# Patient Record
Sex: Female | Born: 1960
Health system: Southern US, Community
[De-identification: ages and names within clinical notes are randomized; demographics above are authoritative.]

## PROBLEM LIST (undated history)

## (undated) DIAGNOSIS — L600 Ingrowing nail: Secondary | ICD-10-CM

## (undated) DIAGNOSIS — Z923 Personal history of irradiation: Secondary | ICD-10-CM

## (undated) DIAGNOSIS — F32A Depression, unspecified: Secondary | ICD-10-CM

## (undated) DIAGNOSIS — R232 Flushing: Secondary | ICD-10-CM

## (undated) DIAGNOSIS — K219 Gastro-esophageal reflux disease without esophagitis: Secondary | ICD-10-CM

## (undated) DIAGNOSIS — N200 Calculus of kidney: Secondary | ICD-10-CM

## (undated) DIAGNOSIS — F329 Major depressive disorder, single episode, unspecified: Secondary | ICD-10-CM

## (undated) DIAGNOSIS — D126 Benign neoplasm of colon, unspecified: Secondary | ICD-10-CM

## (undated) DIAGNOSIS — K1231 Oral mucositis (ulcerative) due to antineoplastic therapy: Secondary | ICD-10-CM

## (undated) DIAGNOSIS — G478 Other sleep disorders: Secondary | ICD-10-CM

## (undated) DIAGNOSIS — T451X5A Adverse effect of antineoplastic and immunosuppressive drugs, initial encounter: Secondary | ICD-10-CM

## (undated) DIAGNOSIS — D701 Agranulocytosis secondary to cancer chemotherapy: Secondary | ICD-10-CM

## (undated) DIAGNOSIS — C50919 Malignant neoplasm of unspecified site of unspecified female breast: Secondary | ICD-10-CM

## (undated) DIAGNOSIS — J029 Acute pharyngitis, unspecified: Secondary | ICD-10-CM

## (undated) DIAGNOSIS — M1990 Unspecified osteoarthritis, unspecified site: Secondary | ICD-10-CM

## (undated) DIAGNOSIS — Z86010 Personal history of colonic polyps: Secondary | ICD-10-CM

## (undated) DIAGNOSIS — Z87891 Personal history of nicotine dependence: Secondary | ICD-10-CM

## (undated) DIAGNOSIS — R635 Abnormal weight gain: Secondary | ICD-10-CM

## (undated) DIAGNOSIS — F419 Anxiety disorder, unspecified: Secondary | ICD-10-CM

## (undated) DIAGNOSIS — M25539 Pain in unspecified wrist: Secondary | ICD-10-CM

## (undated) DIAGNOSIS — M25532 Pain in left wrist: Secondary | ICD-10-CM

## (undated) DIAGNOSIS — N6320 Unspecified lump in the left breast, unspecified quadrant: Secondary | ICD-10-CM

## (undated) DIAGNOSIS — T7840XA Allergy, unspecified, initial encounter: Secondary | ICD-10-CM

## (undated) DIAGNOSIS — R21 Rash and other nonspecific skin eruption: Secondary | ICD-10-CM

## (undated) DIAGNOSIS — Z9221 Personal history of antineoplastic chemotherapy: Secondary | ICD-10-CM

## (undated) DIAGNOSIS — J382 Nodules of vocal cords: Secondary | ICD-10-CM

## (undated) HISTORY — DX: Gastro-esophageal reflux disease without esophagitis: K21.9

## (undated) HISTORY — DX: Anxiety disorder, unspecified: F41.9

## (undated) HISTORY — DX: Nodules of vocal cords: J38.2

## (undated) HISTORY — DX: Pain in unspecified wrist: M25.539

## (undated) HISTORY — DX: Other sleep disorders: G47.8

## (undated) HISTORY — DX: Depression, unspecified: F32.A

## (undated) HISTORY — DX: Abnormal weight gain: R63.5

## (undated) HISTORY — DX: Flushing: R23.2

## (undated) HISTORY — DX: Major depressive disorder, single episode, unspecified: F32.9

## (undated) HISTORY — DX: Rash and other nonspecific skin eruption: R21

## (undated) HISTORY — PX: COLONOSCOPY: SHX174

## (undated) HISTORY — DX: Acute pharyngitis, unspecified: J02.9

## (undated) HISTORY — PX: OTHER SURGICAL HISTORY: SHX169

## (undated) HISTORY — DX: Personal history of nicotine dependence: Z87.891

## (undated) HISTORY — DX: Agranulocytosis secondary to cancer chemotherapy: D70.1

## (undated) HISTORY — DX: Adverse effect of antineoplastic and immunosuppressive drugs, initial encounter: T45.1X5A

## (undated) HISTORY — DX: Pain in left wrist: M25.532

## (undated) HISTORY — DX: Unspecified osteoarthritis, unspecified site: M19.90

## (undated) HISTORY — DX: Allergy, unspecified, initial encounter: T78.40XA

## (undated) HISTORY — DX: Calculus of kidney: N20.0

## (undated) HISTORY — DX: Benign neoplasm of colon, unspecified: D12.6

## (undated) HISTORY — DX: Unspecified lump in the left breast, unspecified quadrant: N63.20

## (undated) HISTORY — DX: Oral mucositis (ulcerative) due to antineoplastic therapy: K12.31

---
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
## 1993-02-05 HISTORY — PX: OTHER SURGICAL HISTORY: SHX169

## 2008-08-11 ENCOUNTER — Encounter (INDEPENDENT_AMBULATORY_CARE_PROVIDER_SITE_OTHER): Payer: Self-pay | Admitting: *Deleted

## 2008-09-27 ENCOUNTER — Encounter: Payer: Self-pay | Admitting: Family Medicine

## 2008-09-27 ENCOUNTER — Ambulatory Visit: Payer: Self-pay | Admitting: Family Medicine

## 2008-09-27 DIAGNOSIS — Z87891 Personal history of nicotine dependence: Secondary | ICD-10-CM | POA: Insufficient documentation

## 2008-09-27 DIAGNOSIS — N951 Menopausal and female climacteric states: Secondary | ICD-10-CM | POA: Insufficient documentation

## 2008-09-27 HISTORY — DX: Personal history of nicotine dependence: Z87.891

## 2008-09-27 HISTORY — DX: Menopausal and female climacteric states: N95.1

## 2008-09-27 LAB — CONVERTED CEMR LAB
ALT: 14 units/L (ref 0–35)
Alkaline Phosphatase: 62 units/L (ref 39–117)
Calcium: 9.2 mg/dL (ref 8.4–10.5)
Chloride: 103 meq/L (ref 96–112)
Creatinine, Ser: 0.69 mg/dL (ref 0.40–1.20)
Glucose, Bld: 88 mg/dL (ref 70–99)
HDL: 59 mg/dL (ref >39)
Potassium: 4 meq/L (ref 3.5–5.3)
Sodium: 140 meq/L (ref 135–145)
Total Bilirubin: 0.5 mg/dL (ref 0.3–1.2)
Total CHOL/HDL Ratio: 3.7
Triglycerides: 134 mg/dL (ref <150)
VLDL: 27 mg/dL (ref 0–40)

## 2008-09-29 ENCOUNTER — Encounter: Payer: Self-pay | Admitting: Family Medicine

## 2008-09-30 ENCOUNTER — Encounter: Payer: Self-pay | Admitting: Family Medicine

## 2009-08-26 ENCOUNTER — Ambulatory Visit: Payer: Self-pay | Admitting: Family Medicine

## 2009-08-26 ENCOUNTER — Encounter: Payer: Self-pay | Admitting: Family Medicine

## 2009-08-26 DIAGNOSIS — E785 Hyperlipidemia, unspecified: Secondary | ICD-10-CM | POA: Insufficient documentation

## 2009-08-26 LAB — CONVERTED CEMR LAB
Cholesterol: 217 mg/dL — ABNORMAL HIGH (ref 0–200)
HDL: 59 mg/dL (ref >39)
TSH: 1.436 microintl units/mL (ref 0.350–4.500)
Total CHOL/HDL Ratio: 3.7
Triglycerides: 113 mg/dL (ref <150)

## 2009-08-29 ENCOUNTER — Encounter: Payer: Self-pay | Admitting: Family Medicine

## 2009-09-12 ENCOUNTER — Encounter: Admission: RE | Admit: 2009-09-12 | Discharge: 2009-09-12 | Payer: Self-pay | Admitting: Family Medicine

## 2009-10-18 ENCOUNTER — Telehealth: Payer: Self-pay | Admitting: Family Medicine

## 2009-11-15 ENCOUNTER — Telehealth: Payer: Self-pay | Admitting: Family Medicine

## 2010-02-13 ENCOUNTER — Encounter (INDEPENDENT_AMBULATORY_CARE_PROVIDER_SITE_OTHER): Payer: Self-pay | Admitting: *Deleted

## 2010-03-03 ENCOUNTER — Encounter (INDEPENDENT_AMBULATORY_CARE_PROVIDER_SITE_OTHER): Payer: Self-pay | Admitting: *Deleted

## 2010-03-06 ENCOUNTER — Ambulatory Visit
Admission: RE | Admit: 2010-03-06 | Discharge: 2010-03-06 | Payer: Self-pay | Source: Home / Self Care | Attending: Gastroenterology | Admitting: Gastroenterology

## 2010-03-07 NOTE — Progress Notes (Signed)
Summary: activella rx  Phone Note Call from Patient   Call For: [phone] Summary of Call: Picked up rx for Activella, but the dosage was less than original.  Patient would like for rx to be changed back to original strength.  Need meds today.    [facility].  No longer use [facility]. Initial call taken by: Britta Mccreedy mcgregor  Follow-up for Phone Call        will forward to MD. Follow-up by: Theresia Lo RN,  October 18, 2009 12:33 PM  Additional Follow-up for Phone Call Additional follow up Details #1::        Called her pharmacy.  She has indeed been on higher dose and did not pick up the lower dose in September.  Will call in the higher dose for her for one month, and can discuss lowering the dose with Dr. Lelon Perla. Additional Follow-up by: Alizandra Loh [place] MD,  October 18, 2009 3:21 PM    New/Updated Medications: ACTIVELLA 1-0.5 MG TABS (ESTRADIOL-NORETHINDRONE ACET) 1 by mouth daily Prescriptions: ACTIVELLA 1-0.5 MG TABS (ESTRADIOL-NORETHINDRONE ACET) 1 by mouth daily  #1 pack x 0   Entered and Authorized by:   Dajia Gunnels [place] MD   Signed by:   Leler Brion [place] MD on 10/18/2009   Method used:   [facility]        [facility]  [phone]* (retail)       [address] [place], [place]  [address]       Ph: [phone] or [phone]       Fax: [phone]   RxID:   [id number]

## 2010-03-07 NOTE — Assessment & Plan Note (Signed)
Summary: cpe,df   Vital Signs:  Patient profile:   50 year old female Height:      62 inches Weight:      140.8 pounds BMI:     25.85 Temp:     98.6 degrees F oral Pulse rate:   80 / minute BP sitting:   100 / 44  (left arm) Cuff size:   regular  Vitals Entered By: Garen Grams LPN (August 26, 2009 8:41 AM) CC: cpe Is Patient Diabetic? No Pain Assessment Patient in pain? no        CC:  cpe.  History of Present Illness: Physical Exam:  Concerns: 1. Hot flashes - Taking HRT (Estrogen + Progestin).  It is helping but not as good as the Prempro.  She is still having 3 hot flashes at night.  Not interested in adjusting dose at this time.  Prevention: 1. PAP smears:  She has had 8 normal PAP smears in the past 8 years.   2. Mammogram:  Has not had a mammogram in 6 years 3. Colonoscopy:  Never had a colonscopy 4. Tobacco: continues to smoke 1 ppd.  Not interested in quiting at this time  ROS: denies chest pain, shortness of breath, fevers, chills, weight loss, abdominal pain, lumps / bumps  Habits & Providers  Alcohol-Tobacco-Diet     Tobacco Status: current     Tobacco Counseling: to quit use of tobacco products     Cigarette Packs/Day: 1.0  Current Medications (verified): 1)  Activella 0.5-0.1 Mg Tabs (Estradiol-Norethindrone Acet) .Marland Kitchen.. 1 Tab By Mouth Daily For Menopausal Symptoms  Allergies: 1)  ! Codeine  Past History:  Past Medical History: Reviewed history from 09/27/2008 and no changes required. Menopausal Syndrome--Hot flashes.  Family History: Reviewed history from 09/27/2008 and no changes required. Mother alive.  HTN. Father deceased age 59 from CVA.  No other health problems Brother and sister are alive and healthy. 2 daughters alive and healthy.  Social History: Reviewed history from 09/27/2008 and no changes required. Lives in [place] with her husband and mother.  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  She drinks  alcohol occassionally and smokes 1 pack of cigarettes per day, uses no illicit drugs.  Physical Exam  General:  Vitals reviewed.  Awake.  Alert.  No acute distress. Eyes:  vision grossly intact.  PERRL.  Fundoscopic exam benign Nose:  no external deformity and no nasal discharge.   Mouth:  good dentition and pharynx pink and moist.   Neck:  supple, full ROM, and no masses.   Lungs:  normal respiratory effort, normal breath sounds, no crackles, and no wheezes.   Heart:  normal rate, regular rhythm, and no murmur.   Abdomen:  soft, non-tender, normal bowel sounds, and no distention.   Extremities:  no lower extremity edema Neurologic:  alert & oriented X3, cranial nerves II-XII intact, strength normal in all extremities, and sensation intact to light touch.   Skin:  turgor normal and color normal.   Psych:  normally interactive, not anxious appearing, and not depressed appearing.     Impression & Recommendations:  Problem # 1:  Preventive Health Care (ICD-V70.0) Assessment Unchanged  -Colonoscopy -Mammogram -Pap smear next year -Lipids  Orders: [facility] - Est  40-64 yrs ([address])  Problem # 2:  MENOPAUSAL SYNDROME (ICD-627.2) Assessment: Unchanged Hot flashes manageable.  Would not want to adjust medications.  Will check TSH to see if that is contributing. Her updated medication  list for this problem includes:    Activella 0.5-0.1 Mg Tabs (Estradiol-norethindrone acet) .Marland Kitchen... 1 tab by mouth daily for menopausal symptoms  Orders: TSH-[facility] ([address])  Problem # 3:  TOBACCO ABUSE (ICD-305.1) Assessment: Unchanged Advised to quit smoking.  Precontemplative at this point.  Complete Medication List: 1)  Activella 0.5-0.1 Mg Tabs (Estradiol-norethindrone acet) .Marland Kitchen.. 1 tab by mouth daily for menopausal symptoms  Other Orders: Colonoscopy (Colon) Mammogram (Screening) (Mammo) Lipid-[facility] ([address])  Patient Instructions: 1)  It was nice to meet you today 2)  I look forward to  taking over your care 3)  We will get you set up for the colonoscopy.  Please call and schedule a mammogram. 4)  We will also check your thyroid and cholesterol levels today 5)  We will do a PAP smear next year 6)  Please schedule a follow up appointment in 1 year Prescriptions: ACTIVELLA 0.5-0.1 MG TABS (ESTRADIOL-NORETHINDRONE ACET) 1 tab by mouth daily for menopausal symptoms  #30 x 11   Entered and Authorized by:   Angelena Sole MD   Signed by:   Angelena Sole MD on 08/26/2009   Method used:   Electronically to        [facility] [phone]* (retail)       [address]       [place], [place]  [address]       Ph: [phone] or [phone]       Fax: [phone]   RxID:   [id number]   Prevention & Chronic Care Immunizations   Influenza vaccine: Not documented    Tetanus booster: 09/27/2008: Tdap    Pneumococcal vaccine: Not documented  Colorectal Screening   Hemoccult: Not documented    Colonoscopy: Not documented   Colonoscopy action/deferral: GI Referral  (08/26/2009)  Other Screening   Pap smear: NEGATIVE FOR INTRAEPITHELIAL LESIONS OR MALIGNANCY.  (09/27/2008)   Pap smear action/deferral: Ordered  (09/27/2008)    Mammogram: Not documented   Mammogram action/deferral: Ordered  (08/26/2009)   Smoking status: current  (08/26/2009)   Smoking cessation counseling: yes  (09/27/2008)  Lipids   Total Cholesterol: 218  (09/27/2008)   Lipid panel action/deferral: Lipid Panel ordered   LDL: 132  (09/27/2008)   LDL Direct: Not documented   HDL: 59  (09/27/2008)   Triglycerides: 134  (09/27/2008)    SGOT (AST): 18  (09/27/2008)   SGPT (ALT): 14  (09/27/2008)   Alkaline phosphatase: 62  (09/27/2008)   Total bilirubin: 0.5  (09/27/2008)  Self-Management Support :    Lipid self-management support: Not documented    Nursing Instructions: Screening colonoscopy ordered Schedule screening mammogram (see order)

## 2010-03-07 NOTE — Progress Notes (Signed)
Summary: Rx Req  Phone Note Refill Request Call back at Home Phone [phone] Message from:  Patient  Refills Requested: Medication #1:  ACTIVELLA 1-0.5 MG TABS 1 by mouth daily. [facility] MORE THAN 1 MONTH SUPPLY.  Initial call taken by: Clydell Hakim,  November 15, 2009 9:12 AM    Prescriptions: ACTIVELLA 1-0.5 MG TABS (ESTRADIOL-NORETHINDRONE ACET) 1 by mouth daily  #1 pack x 6   Entered and Authorized by:   Angelena Sole MD   Signed by:   Angelena Sole MD on 11/16/2009   Method used:   [facility]        [facility]  [phone]* (retail)       [address] [place], [place]  [address]       Ph: [phone] or [phone]       Fax: [phone]   RxID:   [id number]

## 2010-03-07 NOTE — Letter (Signed)
Summary: Generic Letter  [facility]  [address]   [place], [place] [address]   Phone: [phone]  Fax: [phone]    08/29/2009  [facility] [address] [place], [place]  [address]  Dear Ms. Liz Malady,  Here is a copy of your lab results.  Overall they looked pretty good.  Your bad cholesterol (LDL) was just a little bit too high (135, goal is < 130).  I think that it would probably be good to at least start taking a fish oil.  Your thyroid level was normal.  Tests: (1) Lipid Profile ([address])   Order Note: FASTING   Cholesterol          [H]  217 mg/dL                   4-259     ATP III Classification:           < 200        mg/dL        Desirable          200 - 239     mg/dL        Borderline High          >= 240        mg/dL        High         Triglyceride              113 mg/dL                   <563   HDL Cholesterol           59 mg/dL                    >87   Total Chol/HDL Ratio      3.7 Ratio  VLDL Cholesterol (Calc)                             23 mg/dL                    5-64  LDL Cholesterol (Calc)                        [H]  135 mg/dL                   3-32           Total Cholesterol/HDL Ratio:CHD Risk                            Coronary Heart Disease Risk Table                                            Men       Women              1/2 Average Risk              3.4        3.3                  Average Risk              5.0  4.4              2 X Average Risk              9.6        7.1              3 X Average Risk             23.4       11.0     Use the calculated Patient Ratio above and the CHD Risk table      to determine the patient's CHD Risk.     ATP III Classification (LDL):           < 100        mg/dL         Optimal          100 - 129     mg/dL         Near or Above Optimal          130 - 159     mg/dL         Borderline High          160 - 189     mg/dL         High           > 190        mg/dL         Very  High         Sincerely,   Angelena Sole MD  Appended Document: Generic Letter mailed

## 2010-03-08 DIAGNOSIS — D126 Benign neoplasm of colon, unspecified: Secondary | ICD-10-CM

## 2010-03-08 DIAGNOSIS — Z860100 Personal history of colon polyps, unspecified: Secondary | ICD-10-CM | POA: Insufficient documentation

## 2010-03-08 DIAGNOSIS — D369 Benign neoplasm, unspecified site: Secondary | ICD-10-CM

## 2010-03-08 HISTORY — DX: Personal history of colonic polyps: Z86.010

## 2010-03-08 HISTORY — DX: Benign neoplasm of colon, unspecified: D12.6

## 2010-03-08 HISTORY — DX: Personal history of colon polyps, unspecified: Z86.0100

## 2010-03-09 NOTE — Letter (Signed)
Summary: Pre Visit Letter Revised  [facility]  [address] [place], [place] [address]   Phone: [phone]  Fax: [phone]        02/13/2010 MRN: [id number] [facility] [address] [place], [place]  [address]             Procedure Date:  03/13/2010 @ 1:30   Direct colon-Dr. Russella Dar   Welcome to the Gastroenterology Division at [facility].    You are scheduled to see a nurse for your pre-procedure visit on 03/06/2010 at 1:00 on the 3rd floor at [facility], [address]  We ask that you try to arrive at our office 15 minutes prior to your appointment time to allow for check-in.  Please take a minute to review the attached form.  If you answer "Yes" to one or more of the questions on the first page, we ask that you call the person listed at your earliest opportunity.  If you answer "No" to all of the questions, please complete the rest of the form and bring it to your appointment.    Your nurse visit will consist of discussing your medical and surgical history, your immediate family medical history, and your medications.   If you are unable to list all of your medications on the form, please bring the medication bottles to your appointment and we will list them.  We will need to be aware of both prescribed and over the counter drugs.  We will need to know exact dosage information as well.    Please be prepared to read and sign documents such as consent forms, a financial agreement, and acknowledgement forms.  If necessary, and with your consent, a friend or relative is welcome to sit-in on the nurse visit with you.  Please bring your insurance card so that we may make a copy of it.  If your insurance requires a referral to see a specialist, please bring your referral form from your primary care physician.  No co-pay is required for this nurse visit.     If you cannot keep your appointment, please call [phone] to cancel or reschedule prior to your  appointment date.  This allows [place] the opportunity to schedule an appointment for another patient in need of care.    Thank you for choosing [facility] for your medical needs.  We appreciate the opportunity to care for you.  Please visit [place] at our website  to learn more about our practice.  Sincerely, The Gastroenterology Division

## 2010-03-13 ENCOUNTER — Other Ambulatory Visit (AMBULATORY_SURGERY_CENTER): Payer: BLUE CROSS/BLUE SHIELD | Admitting: Gastroenterology

## 2010-03-13 ENCOUNTER — Other Ambulatory Visit: Payer: Self-pay | Admitting: Gastroenterology

## 2010-03-13 DIAGNOSIS — D126 Benign neoplasm of colon, unspecified: Secondary | ICD-10-CM

## 2010-03-13 DIAGNOSIS — Z1211 Encounter for screening for malignant neoplasm of colon: Secondary | ICD-10-CM

## 2010-03-15 NOTE — Miscellaneous (Signed)
Summary: [facility] PV  Clinical Lists Changes  Medications: Added new medication of MOVIPREP 100 GM  SOLR (PEG-KCL-NACL-NASULF-NA ASC-C) As per prep instructions. - Signed Rx of MOVIPREP 100 GM  SOLR (PEG-KCL-NACL-NASULF-NA ASC-C) As per prep instructions.;  #1 x 0;  Signed;  Entered by: Ezra Sites RN;  Authorized by: Meryl Dare MD Clementeen Graham;  Method used: Electronically to [facility] [id number] [address], [place], [place], [place]  [address], Ph: [phone] or [phone], Fax: [phone] Allergies: Changed allergy or adverse reaction from CODEINE to CODEINE    Prescriptions: MOVIPREP 100 GM  SOLR (PEG-KCL-NACL-NASULF-NA ASC-C) As per prep instructions.  #1 x 0   Entered by:   Ezra Sites RN   Authorized by:   Meryl Dare MD [facility]   Signed by:   Ezra Sites RN on 03/06/2010   Method used:   Electronically to        [facility] [address] [phone]* (retail)       [address]       [place], [place]  [address]       Ph: [phone] or [phone]       Fax: [phone]   RxID:   [id number]

## 2010-03-15 NOTE — Letter (Signed)
Summary: [facility] Instructions  [facility]  [address] [place], [place] [address]   Phone: [phone]  Fax: [phone]       [facility]    [birth year]    MRN: [id number]        Procedure Day /Date: Monday 03-13-10      Arrival Time: 12:30 pm     Procedure Time: 1:30 pm     Location of Procedure:                    _x _  [facility] (4th Floor)   PREPARATION FOR COLONOSCOPY WITH MOVIPREP   Starting 5 days prior to your procedure  03-08-10 do not eat nuts, seeds, popcorn, corn, beans, peas,  salads, or any raw vegetables.  Do not take any fiber supplements (e.g. Metamucil, Citrucel, and Benefiber).  THE DAY BEFORE YOUR PROCEDURE         DATE:  03-12-10   DAY: Sunday   1.  Drink clear liquids the entire day-NO SOLID FOOD  2.  Do not drink anything colored red or purple.  Avoid juices with pulp.  No orange juice.  3.  Drink at least 64 oz. (8 glasses) of fluid/clear liquids during the day to prevent dehydration and help the prep work efficiently.  CLEAR LIQUIDS INCLUDE: Water Jello Ice Popsicles Tea (sugar ok, no milk/cream) Powdered fruit flavored drinks Coffee (sugar ok, no milk/cream) Gatorade Juice: apple, white grape, white cranberry  Lemonade Clear bullion, consomm, broth Carbonated beverages (any kind) Strained chicken noodle soup Hard Candy                             4.  In the morning, mix first dose of MoviPrep solution:    Empty 1 Pouch A and 1 Pouch B into the disposable container    Add lukewarm drinking water to the top line of the container. Mix to dissolve    Refrigerate (mixed solution should be used within 24 hrs)  5.  Begin drinking the prep at 5:00 p.m. The MoviPrep container is divided by 4 marks.   Every 15 minutes drink the solution down to the next mark (approximately 8 oz) until the full liter is complete.   6.  Follow completed prep with 16 oz of clear liquid of your choice (Nothing red or  purple).  Continue to drink clear liquids until bedtime.  7.  Before going to bed, mix second dose of MoviPrep solution:    Empty 1 Pouch A and 1 Pouch B into the disposable container    Add lukewarm drinking water to the top line of the container. Mix to dissolve    Refrigerate  THE DAY OF YOUR PROCEDURE      DATE: 03-13-10  DAY: Monday  Beginning at 8:30 a.m. (5 hours before procedure):         1. Every 15 minutes, drink the solution down to the next mark (approx 8 oz) until the full liter is complete.  2. Follow completed prep with 16 oz. of clear liquid of your choice.    3. You may drink clear liquids until  11:30 a.m. (2 HOURS BEFORE PROCEDURE).   MEDICATION INSTRUCTIONS  Unless otherwise instructed, you should take regular prescription medications with a small sip of water   as early as possible the morning of your procedure.           OTHER INSTRUCTIONS  You will need a responsible adult at least 52 years of age to accompany you and drive you home.   This person must remain in the waiting room during your procedure.  Wear loose fitting clothing that is easily removed.  Leave jewelry and other valuables at home.  However, you may wish to bring a book to read or  an iPod/MP3 player to listen to music as you wait for your procedure to start.  Remove all body piercing jewelry and leave at home.  Total time from sign-in until discharge is approximately 2-3 hours.  You should go home directly after your procedure and rest.  You can resume normal activities the  day after your procedure.  The day of your procedure you should not:   Drive   Make legal decisions   Operate machinery   Drink alcohol   Return to work  You will receive specific instructions about eating, activities and medications before you leave.    The above instructions have been reviewed and explained to me by   Ezra Sites RN  March 06, 2010 1:01 PM     I fully understand and  can verbalize these instructions _____________________________ Date _________

## 2010-03-16 ENCOUNTER — Encounter: Payer: Self-pay | Admitting: Gastroenterology

## 2010-03-23 NOTE — Procedures (Addendum)
Summary: Colonoscopy   Colonoscopy  Procedure date:  03/13/2010  Findings:      Location:  [facility].   COLONOSCOPY PROCEDURE REPORT  PATIENT:  Shirley, Howard  MR#:  [id number] BIRTHDATE:   1960/03/18, 49 yrs. old   GENDER:   female ENDOSCOPIST:   Judie Petit T. Russella Dar, MD, [facility] Referred by: Angelena Sole, M.D. PROCEDURE DATE:  03/13/2010 PROCEDURE:  Colonoscopy with snare polypectomy ASA CLASS:   Class II INDICATIONS: 1) Routine Risk Screening  MEDICATIONS:    Fentanyl 75 mcg IV, Versed 8 mg IV DESCRIPTION OF PROCEDURE:   After the risks benefits and alternatives of the procedure were thoroughly explained, informed consent was obtained.  Digital rectal exam was performed and revealed no abnormalities.   The LB [id number]-[id number] endoscope was introduced through the anus and advanced to the cecum, which was identified by both the appendix and ileocecal valve, without limitations.  The quality of the prep was excellent, using MoviPrep.  The instrument was then slowly withdrawn as the colon was fully examined. <<PROCEDUREIMAGES>>          <<OLD IMAGES>>  FINDINGS:  Two polyps were found in the sigmoid colon. They were 4 mm in size. Polyps were snared without cautery. Retrieval was successful.  A normal appearing cecum, ileocecal valve, and appendiceal orifice were identified. The ascending, hepatic flexure, transverse, splenic flexure, descending colon, and rectum appeared unremarkable. Retroflexed views in the rectum revealed no abnormalities. The time to cecum =  4.5  minutes. The scope was then withdrawn (time =  12.75  min) from the patient and the procedure completed.  COMPLICATIONS:   None  ENDOSCOPIC IMPRESSION:  1) 4 mm, two polyps in the sigmoid colon  RECOMMENDATIONS:  1) Await pathology results  2) If the polyps are adenomatous (pre-cancerous), colonoscopy in 5 years. Otherwise follow colorectal cancer screening guidelines for "routine risk" patients with  colonoscopy in 10 years.   Venita Lick. Russella Dar, MD, [facility]     Appended Document: Colonoscopy     Procedures Next Due Date:    Colonoscopy: 03/2015

## 2010-03-23 NOTE — Letter (Addendum)
Summary: Patient Notice- Polyp Results  [facility]  [address] [place], [place] [address]   Phone: [phone]  Fax: [phone]        March 16, 2010 MRN: [id number]    [facility] [address] [place], [place]  [address]    Dear [name],  I am pleased to inform you that the colon polyp(s) removed during your recent colonoscopy was (were) found to be benign (no cancer detected) upon pathologic examination.  I recommend you have a repeat colonoscopy examination in 5 years to look for recurrent polyps, as having colon polyps increases your risk for having recurrent polyps or even colon cancer in the future.  Should you develop new or worsening symptoms of abdominal pain, bowel habit changes or bleeding from the rectum or bowels, please schedule an evaluation with either your primary care physician or with me.  Continue treatment plan as outlined the day of your exam.  Please call us if you are having persistent problems or have questions about your condition that have not been fully answered at this time.  Sincerely,  Meryl Dare MD [facility]  This letter has been electronically signed by your physician.  Appended Document: Patient Notice- Polyp Results Letter Mailed

## 2010-10-12 ENCOUNTER — Other Ambulatory Visit: Payer: Self-pay | Admitting: Family Medicine

## 2010-10-12 NOTE — Telephone Encounter (Signed)
Refill request

## 2010-10-14 NOTE — Telephone Encounter (Signed)
Patient needs make appointment. Over due for her 1 year follow-up physical.

## 2010-10-16 NOTE — Telephone Encounter (Signed)
Pt will call for an October appt - that is when her insurance will kick in.

## 2010-12-27 ENCOUNTER — Encounter: Payer: Self-pay | Admitting: Family Medicine

## 2010-12-27 ENCOUNTER — Other Ambulatory Visit (HOSPITAL_COMMUNITY)
Admission: RE | Admit: 2010-12-27 | Discharge: 2010-12-27 | Disposition: A | Discharge status: Home / Self Care | Payer: BC Managed Care – PPO | Source: Ambulatory Visit | Attending: Family Medicine | Admitting: Family Medicine

## 2010-12-27 ENCOUNTER — Ambulatory Visit (INDEPENDENT_AMBULATORY_CARE_PROVIDER_SITE_OTHER): Payer: BLUE CROSS/BLUE SHIELD | Admitting: Family Medicine

## 2010-12-27 VITALS — BP 106/65 | HR 58 | Temp 98.1°F | Ht 61.0 in | Wt 140.8 lb

## 2010-12-27 DIAGNOSIS — Z01419 Encounter for gynecological examination (general) (routine) without abnormal findings: Secondary | ICD-10-CM | POA: Insufficient documentation

## 2010-12-27 DIAGNOSIS — F17200 Nicotine dependence, unspecified, uncomplicated: Secondary | ICD-10-CM

## 2010-12-27 DIAGNOSIS — Z124 Encounter for screening for malignant neoplasm of cervix: Secondary | ICD-10-CM

## 2010-12-27 DIAGNOSIS — N951 Menopausal and female climacteric states: Secondary | ICD-10-CM

## 2010-12-27 DIAGNOSIS — E785 Hyperlipidemia, unspecified: Secondary | ICD-10-CM

## 2010-12-27 LAB — LIPID PANEL
Cholesterol: 195 mg/dL (ref 0–200)
HDL: 51 mg/dL (ref >39)
LDL Cholesterol: 128 mg/dL — ABNORMAL HIGH (ref 0–99)
Total CHOL/HDL Ratio: 3.8 Ratio
Triglycerides: 81 mg/dL (ref <150)

## 2010-12-27 NOTE — Assessment & Plan Note (Signed)
Checking lipids today.

## 2010-12-27 NOTE — Patient Instructions (Signed)
It was nice to meet you.  If your lab results are normal, I will send you a letter with the results. If abnormal, someone at the clinic will get in touch with you.   Follow-up in 1 year for your next physical.  Please stop smoking.   Happy Thanksgiving.

## 2010-12-27 NOTE — Progress Notes (Signed)
  Subjective:    Patient ID: Shirley Howard, female    DOB: 10/10/1960, 50 y.o.   MRN: [id number]  HPI Here for Pap smear.  2. Menopausal symptoms Hot flashes. Controlled with estrogen tablets. ROS:    Last period 2 years ago.    Denies vaginal dryness, dyspareunia, nipple discharge, breast pain/tenderness.  3. HLD Would like lipids checked today.   4. Tobacco Still smoking 1 ppd. Would like to quit. Difficulty: husband still smokes. Thinks she would be able to quit if her husband didn't smoke. ROS:    Denies difficulty breathing   Denies chest pain  Review of Systems Per HPI    Objective:   Physical Exam Gen: NAD, well-groomed, appears fit Psych: pleasant, engaged, appropriate CV: RRR, no m/r/g Pulm: CTAB, no w/r/r Abd: NABS, soft, NT/ND Ext: no edema GU:    Wart at 5 o'clock position (patient says it has been there for a while)   Vagina: moist, speculum went in easily. Thin white discharge.   Cervix: appears normal; no bleeding, non-friable    Assessment & Plan:

## 2010-12-27 NOTE — Assessment & Plan Note (Signed)
Still smoking 1 ppd.  Difficult to quit because husband smokes. Ready to quit and knows she should. Was successful for 2 years.   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 [place] Spaniel would consider using nicotine replacement, but would prefer to just quit. She thinks she can.

## 2010-12-27 NOTE — Assessment & Plan Note (Addendum)
Persistent. Past 2 years. Estrogen tablets help significantly; tried to not take 1 day and severe hot flashes. Will continue for now. Will re-assess need in 6 months. No worrisome side effects at this time.

## 2011-01-02 ENCOUNTER — Encounter: Payer: Self-pay | Admitting: Family Medicine

## 2011-02-12 ENCOUNTER — Telehealth: Payer: Self-pay | Admitting: Family Medicine

## 2011-02-12 DIAGNOSIS — N951 Menopausal and female climacteric states: Secondary | ICD-10-CM

## 2011-02-12 MED ORDER — ESTRADIOL-NORETHINDRONE ACET 1-0.5 MG PO TABS: 1.0000 | ORAL_TABLET | Freq: Every day | ORAL | Status: DC

## 2011-02-12 NOTE — Telephone Encounter (Signed)
Patient in for visit 12/2010 . No refill noted at that time.   . Paged Dr. Madolyn Frieze  she advises to refill now. Patient notified.

## 2011-02-12 NOTE — Telephone Encounter (Signed)
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  [facility]

## 2011-05-23 ENCOUNTER — Other Ambulatory Visit: Payer: Self-pay | Admitting: Family Medicine

## 2011-05-23 DIAGNOSIS — N951 Menopausal and female climacteric states: Secondary | ICD-10-CM

## 2011-05-23 MED ORDER — ESTRADIOL-NORETHINDRONE ACET 1-0.5 MG PO TABS: 1.0000 | ORAL_TABLET | Freq: Every day | ORAL | Status: DC

## 2011-09-17 ENCOUNTER — Telehealth: Payer: Self-pay | Admitting: Family Medicine

## 2011-09-17 DIAGNOSIS — N951 Menopausal and female climacteric states: Secondary | ICD-10-CM

## 2011-09-17 MED ORDER — ESTRADIOL-NORETHINDRONE ACET 1-0.5 MG PO TABS: 1.0000 | ORAL_TABLET | Freq: Every day | ORAL | Status: DC

## 2011-09-17 NOTE — Telephone Encounter (Signed)
Number invalid  Received refill Rx for M[place]vey HRT for menopause, however, she has history of tobacco use   Will give Rx for 1 month, advise follow-up within that time to discuss need for medication and tobacco use

## 2011-09-27 ENCOUNTER — Telehealth: Payer: Self-pay | Admitting: Family Medicine

## 2011-09-27 DIAGNOSIS — N951 Menopausal and female climacteric states: Secondary | ICD-10-CM

## 2011-09-27 MED ORDER — ESTRADIOL-NORETHINDRONE ACET 1-0.5 MG PO TABS: 1.0000 | ORAL_TABLET | Freq: Every day | ORAL | Status: DC

## 2011-09-27 NOTE — Telephone Encounter (Signed)
I apologized for mix-up I did write on D/C summary for her to follow-up in 1 year but I wanted to re-assess her hot flashes in 6 months  Rx given for 2 months worth. Advised she make follow-up within that time period

## 2011-09-27 NOTE — Telephone Encounter (Signed)
States that Dr Madolyn Frieze told her not to come for a year which is November (for her annual) and that Dr Madolyn Frieze did not refill enough MIMVEY to last until then.  Wants to know why it is not filled until then

## 2011-10-15 ENCOUNTER — Ambulatory Visit (INDEPENDENT_AMBULATORY_CARE_PROVIDER_SITE_OTHER): Payer: BC Managed Care – PPO | Admitting: Family Medicine

## 2011-10-15 ENCOUNTER — Encounter: Payer: Self-pay | Admitting: Family Medicine

## 2011-10-15 VITALS — BP 126/79 | HR 64 | Temp 98.3°F | Ht 61.0 in | Wt 147.6 lb

## 2011-10-15 DIAGNOSIS — Z8659 Personal history of other mental and behavioral disorders: Secondary | ICD-10-CM | POA: Insufficient documentation

## 2011-10-15 DIAGNOSIS — F17200 Nicotine dependence, unspecified, uncomplicated: Secondary | ICD-10-CM

## 2011-10-15 DIAGNOSIS — N951 Menopausal and female climacteric states: Secondary | ICD-10-CM

## 2011-10-15 MED ORDER — VENLAFAXINE HCL 37.5 MG PO TABS: ORAL_TABLET | ORAL | Status: DC

## 2011-10-15 NOTE — Assessment & Plan Note (Signed)
-  Stop Mimvey due to persistent tobacco use -Try Effexor. Discussed side effects (notably initial anxiety).

## 2011-10-15 NOTE — Assessment & Plan Note (Signed)
Not interested in quitting 

## 2011-10-15 NOTE — Patient Instructions (Addendum)
Start Effexor.    Week 1: 1/2 tablet twice a day   Week 2 on: 1 tablet twice a day  Follow-up in 4 weeks   INTRODUCTION - During a woman's reproductive years, the ovaries produce estrogen and progesterone. Estrogen is important for normal menstrual periods and fertility, and it promotes bone strength. Estrogen and progesterone levels fall at the time of menopause, causing well-known symptoms such as hot flashes. Postmenopausal hormone therapy is the term used to describe the two hormones, estrogen and progestin, that are the most effective treatments available to relieve bothersome symptoms of menopause. However, some women cannot take estrogen, for example, women with breast cancer. Other women choose not to take hormone therapy. Fortunately, there are some alternatives to hormone therapy to treat menopausal symptoms. Although they may not be as effective as estrogen, they do provide some relief. This article discusses alternatives to postmenopausal hormone therapy. A separate article discusses the risks, benefits, and options for hormone therapy. (See "Patient information: Postmenopausal hormone therapy (Beyond the Basics)".) CONTROLLING HOT FLASHES - Non-estrogen treatments for hot flashes are effective in many women. None work as well as estrogen, but they are better than placebo (sugar pills). Not all women need treatment for hot flashes since they are mild in some women. Options include: Gabapentin - Gabapentin (Neurontin) is a drug that is primarily used to treat seizures. It also relieves hot flashes in some women, when given as a single bedtime dose. Antidepressants - Antidepressant medications are recommended as a first line treatment for hot flashes in women who cannot take estrogen. Venlafaxine (brand name Effexor), citalopram (brand name Celexa), and escitalopram (brand name Lexapro) were developed to treat depression, but studies show that they are an effective treatment for hot  flashes. Paroxetine (brand name Paxil) is also effective for hot flashes, but you should not take paroxetine if you have breast cancer and are taking tamoxifen. The concern is that paroxetine can interfere with tamoxifen and make it less effective.  Fluoxetine (brand name Prozac) is also effective, but might not work as well as venlafaxine, citalopram, escitalopram, and paroxetine. Sertraline (Zoloft) is not helpful for treating hot flashes. Other antidepressant side effects and interactions are discussed in detail in a separate article. (See "Patient information: Depression treatment options for adults (Beyond the Basics)".) Progesterone - The injectable progestin birth control hormone, medroxyprogesterone acetate (Depo-Provera) helps to reduce hot flashes. This option is not used as often as gabapentin or antidepressants.

## 2011-10-15 NOTE — Progress Notes (Signed)
  Subjective:    Patient ID: Shirley Howard, female    DOB: 12/05/1960, 51 y.o.   MRN: [id number]  HPI # Hot flash management Mimvey helps during the daytime, however, she has 3-4 episodes of hot flashes lasting 10-15 minutes every night ROS: she denies chronic pain, depression/anxiety  # Tobacco use  Review of Systems Per HPI Denies lower extremity pain or swelling  Allergies, medication, past medical history reviewed.  Significant for: -History of depression/anxiety in the past    Objective:   Physical Exam GEN: NAD PSYCH: engaged, appropriate to questions CV: RRR PULM: NI WOB    Assessment & Plan:

## 2011-11-12 ENCOUNTER — Other Ambulatory Visit: Payer: Self-pay | Admitting: Family Medicine

## 2011-11-26 ENCOUNTER — Ambulatory Visit (INDEPENDENT_AMBULATORY_CARE_PROVIDER_SITE_OTHER): Payer: BC Managed Care – PPO | Admitting: Family Medicine

## 2011-11-26 ENCOUNTER — Encounter: Payer: Self-pay | Admitting: Family Medicine

## 2011-11-26 VITALS — BP 146/90 | HR 69 | Temp 98.5°F | Ht 61.0 in | Wt 148.0 lb

## 2011-11-26 DIAGNOSIS — F17200 Nicotine dependence, unspecified, uncomplicated: Secondary | ICD-10-CM

## 2011-11-26 DIAGNOSIS — Z23 Encounter for immunization: Secondary | ICD-10-CM

## 2011-11-26 NOTE — Progress Notes (Signed)
  Subjective:    Patient ID: Shirley Howard, female    DOB: May 07, 1960, 51 y.o.   MRN: [id number]  HPI # Hot flashes Effexor helping more than Mimvey. She denies any adverse reactions She has hot flashes 2-3 x a day in the early morning   # Left sciatic pain Pain comes and goes, recent flare a few days ago It is improving with ibuprofen  # She continues to smoke She and her husband plan on quitting February 06, 2012  Review of Systems Per HPI  Allergies, medication, past medical history reviewed.     Objective:   Physical Exam GEN: NAD PSYCH: not anxious or depressed appearing CV: RRR, no m/r/g PULM: NI WOB BACK:   5/5 strength lower extremities, intact sensation   No obvious muscle spasm   Negative SLR   GAIT: normal    Assessment & Plan:

## 2011-11-26 NOTE — Assessment & Plan Note (Signed)
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

## 2011-11-26 NOTE — Patient Instructions (Addendum)
For sciatic pain: -Try warm compresses -Getting a good night's sleep  -Take ibuprofen 600 mg every 6 hours as needed or Tylenol 650 mg every 8 hours as needed. Take with food.   Follow-up in 6 months

## 2012-03-03 ENCOUNTER — Encounter: Payer: Self-pay | Admitting: Family Medicine

## 2012-03-03 ENCOUNTER — Ambulatory Visit (INDEPENDENT_AMBULATORY_CARE_PROVIDER_SITE_OTHER): Payer: BC Managed Care – PPO | Admitting: Family Medicine

## 2012-03-03 VITALS — BP 107/70 | HR 78 | Temp 98.1°F | Ht 62.0 in | Wt 152.0 lb

## 2012-03-03 DIAGNOSIS — L03039 Cellulitis of unspecified toe: Secondary | ICD-10-CM

## 2012-03-03 MED ORDER — DOXYCYCLINE HYCLATE 100 MG PO TABS: 100.0000 mg | ORAL_TABLET | Freq: Two times a day (BID) | ORAL | Status: DC

## 2012-03-03 NOTE — Patient Instructions (Addendum)
Take antibiotic for 5 days  Keep toenail clean, dry, and covered for the next 5 days  Follow-up as needed

## 2012-03-03 NOTE — Progress Notes (Signed)
  Subjective:    Patient ID: Shirley Howard, female    DOB: [birth year], 52 y.o.   MRN: [id number]  HPI She presents for left toe nail that started about a week ago.  She has ingrown toenails and had this happen to her right big toe several years ago. She saw a podiatrist at that time as well.   Review of Systems Denies fevers/chills/nausea/vomiting     Objective:   Physical Exam GEN: NAD LEFT FOOT:    1st toe, medial nail bed, with abscess and fluctuance; no obvious rash but very tender; no warmth   Lateral toenails curve inward into nailbed  Procedure note: left toenail medial abscess incision and drainage and excision of medial lateral nail wedge Written consent discussed and signed.  Area prepped with Betadine x 2 and alcohol swabs x 2. Digital block performed with 5 mL of 1% lidocaine without epinephrine Cuticle separated from nail bed with #11 blade 1 mL of pus expressed  Medial border of the nail was isolated by incising the nail longitudinally through it's entire length and into the nail bed with nail scissors. Nail elevated using nail elevator, removed using hemostats and traction force.  Nail bed was ablated using phenol followed by copious alcohol wash.  No complications.  Minimal bleeding.  Sterile bandage applied and post procedure instructions given.  Patient tolerated procedure well without complications.     Assessment & Plan:

## 2012-03-03 NOTE — Assessment & Plan Note (Signed)
Abscess incised and drained on medial lateral nailbed and medial lateral nail wedge removed due to her having ingrown toenails. Doxycycline for 5 days. Follow-up as needed.

## 2012-04-28 ENCOUNTER — Ambulatory Visit (HOSPITAL_COMMUNITY)
Admission: RE | Admit: 2012-04-28 | Discharge: 2012-04-28 | Disposition: A | Discharge status: Home / Self Care | Payer: BC Managed Care – PPO | Source: Ambulatory Visit | Attending: Family Medicine | Admitting: Family Medicine

## 2012-04-28 ENCOUNTER — Encounter: Payer: Self-pay | Admitting: Family Medicine

## 2012-04-28 ENCOUNTER — Ambulatory Visit (INDEPENDENT_AMBULATORY_CARE_PROVIDER_SITE_OTHER): Payer: BC Managed Care – PPO | Admitting: Family Medicine

## 2012-04-28 VITALS — BP 116/69 | HR 69 | Temp 98.6°F | Ht 62.0 in | Wt 148.0 lb

## 2012-04-28 DIAGNOSIS — M25532 Pain in left wrist: Secondary | ICD-10-CM | POA: Insufficient documentation

## 2012-04-28 DIAGNOSIS — M25539 Pain in unspecified wrist: Secondary | ICD-10-CM

## 2012-04-28 HISTORY — DX: Pain in left wrist: M25.532

## 2012-04-28 MED ORDER — VENLAFAXINE HCL 75 MG PO TABS: 75.0000 mg | ORAL_TABLET | Freq: Two times a day (BID) | ORAL | Status: DC

## 2012-04-28 NOTE — Progress Notes (Signed)
  Subjective:    Patient ID: Shirley Howard, female    DOB: October 16, 1960, 52 y.o.   MRN: [id number]  HPI # Left wrist pain She woke up one day and it was hurting. It has been about 2 months.  It is getting worse and her hand gets numb. At nighttime, the pain is worse and sometimes it goes to sleep. It wakes her up several times a night. Inciting event: none Past injury: cyst removed on lateral wrist as a child Medication tried:  -ibuprofen: 3 tablets no relief -wrist brace: tried using last night and it helped because she did not wake up as a much   Review of Systems Per HPI Denies fevers/chills Denies rash  Allergies, medication, past medical history reviewed.  Smoking status noted. Current tobacco user.  No history of gout or arthritis  Social history: Truck driver    Objective:   Physical Exam GEN: NAD LEFT WRIST:    Appearance: no rash, bruising, swelling   Tenderness: moderate tenderness inferior snuffbox   ROM: intact but significant pain with resisted thumb abduction    Maneuvers: negative Finkelstein's, Phalen/Tinel/carpal compression test; no pain elicited with axial pressure down thumb     Assessment & Plan:

## 2012-04-28 NOTE — Patient Instructions (Addendum)
Get x-ray of your wrist I will call with the results   Follow-up in 2-4 weeks regarding hot flashes

## 2012-04-28 NOTE — Assessment & Plan Note (Signed)
Subacute left wrist pain in the snuffbox region that started 2 months ago that is now worsening, particularly at nighttime.  D/Dx: scaphoid fracture, scaphoidlunate instability, carpal metacarpal osteoarthritis -Check x-ray; I will call with results to discuss plans

## 2012-04-29 ENCOUNTER — Telehealth: Payer: Self-pay | Admitting: Family Medicine

## 2012-04-29 NOTE — Telephone Encounter (Signed)
Telephone. Notified of normal x-ray of wrist, negative for fracture or scahpidlunate instability. Arthritis, inflammatory process still possible.  -Try ibuprofen 600-800 mg bid-tid for the next week. Take with food.  -Let me know in a week how patient is doing. If not improved, consider steroid burst versus referral to sports medicine for further evaluation. -Continue wearing wrist brace since this seems to help; ice.

## 2012-06-11 ENCOUNTER — Other Ambulatory Visit: Payer: Self-pay | Admitting: *Deleted

## 2012-06-12 MED ORDER — VENLAFAXINE HCL 75 MG PO TABS: 75.0000 mg | ORAL_TABLET | Freq: Two times a day (BID) | ORAL | Status: DC

## 2012-06-16 ENCOUNTER — Ambulatory Visit (INDEPENDENT_AMBULATORY_CARE_PROVIDER_SITE_OTHER): Payer: BC Managed Care – PPO | Admitting: Family Medicine

## 2012-06-16 ENCOUNTER — Encounter: Payer: Self-pay | Admitting: Family Medicine

## 2012-06-16 VITALS — BP 113/75 | HR 78 | Ht 61.0 in | Wt 149.0 lb

## 2012-06-16 DIAGNOSIS — N951 Menopausal and female climacteric states: Secondary | ICD-10-CM

## 2012-06-16 DIAGNOSIS — M25539 Pain in unspecified wrist: Secondary | ICD-10-CM

## 2012-06-16 DIAGNOSIS — M25532 Pain in left wrist: Secondary | ICD-10-CM

## 2012-06-16 MED ORDER — METHYLPREDNISOLONE ACETATE 80 MG/ML IJ SUSP
80.0000 mg | Freq: Once | INTRAMUSCULAR | Status: AC
  Administered 2012-06-16: 80 mg via INTRA_ARTICULAR

## 2012-06-16 NOTE — Patient Instructions (Addendum)
Follow-up in 2 weeks  Continue to wear brace and ice 20 minutes at a time at least once a day  De Quervain's Tenosynovitis De Quervain's tenosynovitis involves inflammation of one or two tendon linings (sheaths) or strain of one or two tendons to the thumb: extensor pollicis brevis (EPB), or abductor pollicis longus (APL). This causes pain on the side of the wrist and base of the thumb. Tendon sheaths secrete a fluid that lubricates the tendon, allowing the tendon to move smoothly. When the sheath becomes inflamed, the tendon cannot move freely in the sheath. Both the EPB and APL tendons are important for proper use of the hand. The EPB tendon is important for straightening the thumb. The APL tendon is important for moving the thumb away from the index finger (abducting). The two tendons pass through a small tube (canal) in the wrist, near the base of the thumb. When the tendons become inflamed, pain is usually felt in this area. SYMPTOMS   Pain, tenderness, swelling, warmth, or redness over the base of the thumb and thumb side of the wrist.  Pain that gets worse when straightening the thumb.  Pain that gets worse when moving the thumb away from the index finger, against resistance.  Pain with pinching or gripping.  Locking or catching of the thumb.  Limited motion of the thumb.  Crackling sound (crepitation) when the tendon or thumb is moved or touched.  Fluid-filled cyst in the area of the base of the thumb. CAUSES   Tenosynovitis is often linked with overuse of the wrist.  Tenosynovitis may be caused by repeated injury to the thumb muscle and tendon units, and with repeated motions of the hand and wrist, due to friction of the tendon within the lining (sheath).  Tenosynovitis may also be due to a sudden increase in activity or change in activity. RISK INCREASES WITH:  Sports that involve repeated hand and wrist motions (golf, bowling, tennis, squash, racquetball).  Heavy  labor.  Poor physical wrist strength and flexibility.  Failure to warm up properly before practice or play.  Female gender.  New mothers who hold their baby's head for long periods or lift infants with thumbs in the infant's armpit (axilla). PREVENTION  Warm up and stretch properly before practice or competition.  Allow enough time for rest and recovery between practices and competition.  Maintain appropriate conditioning:  Cardiovascular fitness.  Forearm, wrist, and hand flexibility.  Muscle strength and endurance.  Use proper exercise technique. PROGNOSIS  This condition is usually curable within 6 weeks, if treated properly with non-surgical treatment and resting of the affected area.  RELATED COMPLICATIONS   Longer healing time if not properly treated or if not given enough time to heal.  Chronic inflammation, causing recurring symptoms of tenosynovitis. Permanent pain or restriction of movement.  Risks of surgery: infection, bleeding, injury to nerves (numbness of the thumb), continued pain, incomplete release of the tendon sheath, recurring symptoms, cutting of the tendons, tendons sliding out of position, weakness of the thumb, thumb stiffness. TREATMENT  First, treatment involves the use of medicine and ice, to reduce pain and inflammation. Patients are encouraged to stop or modify activities that aggravate the injury. Stretching and strengthening exercises may be advised. Exercises may be completed at home or with a therapist. You may be fitted with a brace or splint, to limit motion and allow the injury to heal. Your caregiver may also choose to give you a corticosteroid injection, to reduce the pain and inflammation.  If non-surgical treatment is not successful, surgery may be needed. Most tenosynovitis surgeries are done as outpatient procedures (you go home the same day). Surgery may involve local, regional (whole arm), or general anesthesia.  MEDICATION   If pain  medicine is needed, nonsteroidal anti-inflammatory medicines (aspirin and ibuprofen), or other minor pain relievers (acetaminophen), are often advised.  Do not take pain medicine for 7 days before surgery.  Prescription pain relievers are often prescribed only after surgery. Use only as directed and only as much as you need.  Corticosteroid injections may be given if your caregiver thinks they are needed. There is a limited number of times these injections may be given. COLD THERAPY   Cold treatment (icing) should be applied for 10 to 15 minutes every 2 to 3 hours for inflammation and pain, and immediately after activity that aggravates your symptoms. Use ice packs or an ice massage. SEEK MEDICAL CARE IF:   Symptoms get worse or do not improve in 2 to 4 weeks, despite treatment.  You experience pain, numbness, or coldness in the hand.  Blue, gray, or dark color appears in the fingernails.  Any of the following occur after surgery: increased pain, swelling, redness, drainage of fluids, bleeding in the affected area, or signs of infection.  New, unexplained symptoms develop. (Drugs used in treatment may produce side effects.) Document Released: 01/22/2005 Document Revised: 04/16/2011 Document Reviewed: 05/06/2008 [facility] Patient Information 2013 [place], [place].

## 2012-06-16 NOTE — Progress Notes (Signed)
  Subjective:    Patient ID: Shirley Howard, female    DOB: 1960-07-30, 52 y.o.   MRN: [id number]  HPI # Left wrist pain  It is worse than before a month ago She is wearing the wrist brace most of the time including at nighttime  She is taking ibuprofen 6-9 tablets a day. It provide no relief.   Exacerbated by: any movement  Alleviated by: nothing; see above; including Biofreeze, arthritis cream  # Hot flashes follow-up  She thinks Effexor may be helping Frequency of hot flashes: variable; sometimes several, sometimes none   Review of Systems Per HPI  Denies vaginal dryness, depression  Allergies, medication, past medical history reviewed.  Smoking status noted.     Objective:   Physical Exam GEN: NAD; well-nourished, -appearing CV: RRR LEFT WRIST:    Mild swelling in anatomic snuffbox region with tenderness   Positive Finkelstein's   ROM wrist/hand/digits/elbow: full    Carpals, MCP's, digits: NT   Axial load test: neg   Phalen's: neg   Tinel's: neg   Atrophy: neg   Hand sensation: intact  DeQuervain's Tenosynovitis Injection Written informed consent was obtained. Risks (including rare risk of infection, risk of skin atrophy, risk of skin bleaching), benefits, and alternatives were discussed. Tendons identified and marked. Prepped with Betadine. Injected at radial styloid. Decreased pain post injection.  Needle size: 25 gauge Injection: 2 cc of Lidocaine 1% and 1/2 cc of Depo-Medrol 40 mg      Assessment & Plan:

## 2012-06-17 NOTE — Assessment & Plan Note (Signed)
Suspect de Quervain's  -Steroid injection today -Continue brace, ibuprofen prn, icing at end of day -Follow-up in 2 weeks

## 2012-06-17 NOTE — Assessment & Plan Note (Signed)
Improved with Effexor but sometime still gets frequent symptoms.  -We discussed possibly adding gabapentin. She declined at this time; says her symptoms are tolerable.  -She continues to smoke so will avoid HRT.

## 2012-08-06 ENCOUNTER — Encounter: Payer: Self-pay | Admitting: Family Medicine

## 2012-08-06 ENCOUNTER — Ambulatory Visit (INDEPENDENT_AMBULATORY_CARE_PROVIDER_SITE_OTHER): Payer: BC Managed Care – PPO | Admitting: Family Medicine

## 2012-08-06 VITALS — BP 108/70 | HR 72 | Temp 98.3°F | Wt 149.0 lb

## 2012-08-06 DIAGNOSIS — M25539 Pain in unspecified wrist: Secondary | ICD-10-CM

## 2012-08-06 DIAGNOSIS — M25532 Pain in left wrist: Secondary | ICD-10-CM

## 2012-08-06 NOTE — Assessment & Plan Note (Signed)
A: ganglion cyst left wrist. P: Reassurance. As the patient that the treatment options include aspiration of the cyst versus referral to hand surgery for definitive cystectomy. Patient is interested in possible referral to hand surgery in the future if she feels like there is a good chance that her pain will worsen and she will require another corticosteroid injection. After referral to telemetry medically appropriate I recommend Dr. Amanda Pea, hand surgeon.

## 2012-08-06 NOTE — Progress Notes (Signed)
Subjective:     Patient ID: Shirley Howard, female   DOB: 06-24-1960, 52 y.o.   MRN: [id number]  HPI 52 yo F presents for same day visit for L wrist x4 weeks. She does at the limbus to smoke larger. This is associated with intermittent pain is about 2 or 3/10 in severity. She is a history of suspected left de Quervains tenosynovitis. This is treated with a corticosteroid injection. 2 weeks after the injection she had resolution of pain. The pain is she currently experiences is much less than what she had at that time. She denies injury to her left wrist. She denies pain with movement.  Review of Systems As per history of present illness    Objective:   Physical Exam BP 108/70  Pulse 72  Temp(Src) 98.3 F (36.8 C) (Oral)  Wt 149 lb (67.586 kg)  BMI 28.17 kg/m2 General appearance: alert, cooperative and no distress Extremities: Full range of motion of left wrist. There is a small palpable 1 x 1 cm subcutaneous mass in on the radial aspect of her left wrist and about half a centimeter from her radial artery. The mass is not tender. There is no overlying erythema or skin dimpling.    Assessment and Plan:

## 2012-09-29 ENCOUNTER — Other Ambulatory Visit: Payer: Self-pay | Admitting: *Deleted

## 2012-09-29 MED ORDER — VENLAFAXINE HCL 75 MG PO TABS: 75.0000 mg | ORAL_TABLET | Freq: Two times a day (BID) | ORAL | Status: DC

## 2012-10-27 ENCOUNTER — Ambulatory Visit (INDEPENDENT_AMBULATORY_CARE_PROVIDER_SITE_OTHER): Payer: BC Managed Care – PPO | Admitting: Family Medicine

## 2012-10-27 ENCOUNTER — Encounter: Payer: Self-pay | Admitting: Family Medicine

## 2012-10-27 VITALS — BP 137/86 | HR 67 | Temp 99.0°F | Ht 61.0 in | Wt 155.3 lb

## 2012-10-27 DIAGNOSIS — J029 Acute pharyngitis, unspecified: Secondary | ICD-10-CM

## 2012-10-27 HISTORY — DX: Acute pharyngitis, unspecified: J02.9

## 2012-10-27 MED ORDER — OMEPRAZOLE 40 MG PO CPDR: 40.0000 mg | DELAYED_RELEASE_CAPSULE | Freq: Every day | ORAL | Status: DC

## 2012-10-27 MED ORDER — AMOXICILLIN-POT CLAVULANATE 875-125 MG PO TABS: 1.0000 | ORAL_TABLET | Freq: Two times a day (BID) | ORAL | Status: DC

## 2012-10-27 NOTE — Progress Notes (Signed)
Shirley Howard is a 52 y.o. female who presents to [facility] today for sore throat.  Sore throat: 16 days ago onset. Affecting voice since first day. Better first thing in the morning. Intermittent soreness. Sometimes worse w/ swallowing and food. No change since onset. Throat lozenges w/o benefit. No h/o mono or strep infections. Denies sick contacts. Denies any n/v/d/c, HA, rash, fevers. Occasional ETOH use.  Denies night sweats, unintentional wt loss. Father passed from stroke but had throat cancer and was a smoker.  Denies reflux  Tobacco 1ppd. Has smoked for 37 years.   The following portions of the patient's history were reviewed and updated as appropriate: allergies, current medications, past medical history, family and social history, and problem list.  Patient is a smoker.   No past medical history on file.  ROS as above otherwise neg.    Medications reviewed. Current Outpatient Prescriptions  Medication Sig Dispense Refill  . aspirin 81 MG tablet Take 81 mg by mouth daily.      . diphenhydrAMINE (SOMINEX) 25 MG tablet Take 25 mg by mouth at bedtime as needed for sleep.      . fish oil-omega-3 fatty acids 1000 MG capsule Take 2 g by mouth daily.      . Multiple Vitamin (MULTIVITAMIN) tablet Take 1 tablet by mouth daily.      Marland Kitchen venlafaxine (EFFEXOR) 75 MG tablet Take 1 tablet (75 mg total) by mouth 2 (two) times daily.  60 tablet  3   No current facility-administered medications for this visit.    Exam: BP 137/86  Pulse 67  Temp(Src) 99 F (37.2 C) (Oral)  Ht 5\' 1"  (1.549 m)  Wt 155 lb 4.8 oz (70.444 kg)  BMI 29.36 kg/m2 Gen: Well NAD HEENT: EOMI,  MMM, mild L  sided anteriorneck fullness and ttp.  Lungs: CTABL Nl WOB Heart: RRR no MRG Abd: NABS, NT, ND Exts: Non edematous BL  LE, warm and well perfused.   No results found for this or any previous visit (from the past 72 hour(s)).

## 2012-10-27 NOTE — Assessment & Plan Note (Signed)
Etiology likely viral but enough symptoms that are concerning for a possible throat growth vs reflux.  Will treat w/ Aug as symptoms persistent for greater than 2 wks and may have passed from viral to bacterial superinfection 2 wk trial of omeprazole Mucinex D for cough. No reported rhinorrhea but still may have some post nasal drip) ENT referral for scope given acute onset voice change w/o resolution for 16 days, 30+ pk year of smoking and father w/ throat cancer. Pt may cancel this appt if resolves w/ PPI and other therapies/time

## 2012-10-27 NOTE — Patient Instructions (Addendum)
The cause of your sore throat is not exactly clear. It may be froma simple viral or bacterial infection or it may be from reflux or from a throat growth. Please start the augmentin for a possible infection Please start the omeprazole (2 weeks of treatment) for possible reflux Please follow up with the ENT doctor when scheduled Please try to quit smoking. You may try [email or web address] for assistance. Please also schedule a follow up appointment with Dr. Titus Mould in 2 weeks Have a wonderful day.

## 2012-11-03 ENCOUNTER — Encounter: Payer: Self-pay | Admitting: Family Medicine

## 2012-11-11 ENCOUNTER — Ambulatory Visit (INDEPENDENT_AMBULATORY_CARE_PROVIDER_SITE_OTHER): Payer: BC Managed Care – PPO | Admitting: Family Medicine

## 2012-11-11 ENCOUNTER — Encounter: Payer: Self-pay | Admitting: Family Medicine

## 2012-11-11 VITALS — BP 120/77 | HR 67 | Temp 98.3°F | Wt 155.0 lb

## 2012-11-11 DIAGNOSIS — N951 Menopausal and female climacteric states: Secondary | ICD-10-CM

## 2012-11-11 DIAGNOSIS — M25532 Pain in left wrist: Secondary | ICD-10-CM

## 2012-11-11 DIAGNOSIS — M25539 Pain in unspecified wrist: Secondary | ICD-10-CM

## 2012-11-11 MED ORDER — METHYLPREDNISOLONE ACETATE 40 MG/ML IJ SUSP
20.0000 mg | Freq: Once | INTRAMUSCULAR | Status: AC
  Administered 2012-11-11: 20 mg via INTRA_ARTICULAR

## 2012-11-11 NOTE — Progress Notes (Signed)
[facility]  Patient name: Shirley Howard MRN [id number]  Date of birth: [birth year]  CC & HPI:  Shirley Howard is a 52 y.o. female presenting today for Left wrist pain and insomnia.   Insomnia - Reports early morning wakening ~ 3am with additional wakening every few hours. She associates these awakening with Hot flashes that she has been having for several years now. She has been taking Effexor for the past year to treat these symptoms w/o improvement and now wants to stop this medication.   Left Wrist pain.  - Described as achy pain at night with sharp shooting pain with some movements ( opening jars and thumb movement ). The pain is located at the base of her left 1st metacarpal base. She was previously seen for this same pain in May diagnosed with Tommi Rumps quervain's and treated with an injection ~ 5 months ago  Which completely resolved the pain for several months until starting again ~ 2 weeks ago. Since then she has started wearing her wrist guard again at night. OTC pains meds and ice/heat have not helped.  She denies any redness or warmth in the joint or history of gout, and denies trauma to her thumb.   ROS:  Please See HPI above   Pertinent History Reviewed:  Medical & Surgical Hx:  Reviewed:  Medications & Allergies: Reviewed & Updated - see associated section  Social History: Reviewed:   reports that she quit smoking 7 days ago. Her smoking use included Cigarettes. She smoked 1.00 pack per day. She does not have any smokeless tobacco history on file.  History  Alcohol Use: Not on file   History  Drug Use No    Objective Findings:  Vitals: BP 120/77  Pulse 67  Temp(Src) 98.3 F (36.8 C) (Oral)  Wt 155 lb (70.308 kg)  BMI 29.3 kg/m2  Gen: NAD CV: RRR w/o m/r/g, pulses +2 b/l Resp: CTAB w/ normal respiratory effort Wrist: Mild swelling of her left wrist confined to radial aspect with no erythema. Tenderness to palpation along the base of her 1st metacarpal  / radius. Positive Finkelstein test on left  Assessment & Plan:   Please See Problem Focused Assessment & Plan

## 2012-11-11 NOTE — Assessment & Plan Note (Signed)
Continued night sweats and insomnia - Should be nearing end of menopause - Recently quit smoking with husband ~ 1 week; possibly contributing to insomnia vs medication effect (taking Effexor at night) vs menopausal  - No relief with Effexor: will taper off starting today ( off completely in 1 week ) - Advised to follow-up with PCP in a few weeks in no improvement after stopping Effexor

## 2012-11-11 NOTE — Patient Instructions (Addendum)
It was great seeing you today. Below is a list of the things we talked about:   1) Decrease Effexor 37.5mg  every 3 days until discontinued  Please check-out at the front desk before leaving the clinic.  I look forward to talking with you again at our next visit. If you have any questions or concerns before then, please call the clinic at [phone].  See you soon,   Dr Wenda Low

## 2012-11-11 NOTE — Assessment & Plan Note (Signed)
Likely Lollie Sails but possible OA Positive Finkelstein test on left - Previous relief with steroid injection - Previous Xray reviewed: No obviously fractures, OA - Steroid injection today: 2cc Lidocaine (1%) & 0.5 cc of Depo Medrol 40mg  - Advised pt to perform wrist stretching exercise starting 1 week after resolution of pain, and use Ice as needed

## 2012-11-24 ENCOUNTER — Ambulatory Visit (INDEPENDENT_AMBULATORY_CARE_PROVIDER_SITE_OTHER): Payer: BC Managed Care – PPO | Admitting: Family Medicine

## 2012-11-24 ENCOUNTER — Encounter: Payer: Self-pay | Admitting: Family Medicine

## 2012-11-24 ENCOUNTER — Other Ambulatory Visit: Payer: Self-pay

## 2012-11-24 VITALS — BP 112/58 | HR 60 | Temp 98.3°F | Ht 61.0 in | Wt 158.0 lb

## 2012-11-24 DIAGNOSIS — Z1231 Encounter for screening mammogram for malignant neoplasm of breast: Secondary | ICD-10-CM

## 2012-11-24 DIAGNOSIS — G478 Other sleep disorders: Secondary | ICD-10-CM | POA: Insufficient documentation

## 2012-11-24 DIAGNOSIS — E785 Hyperlipidemia, unspecified: Secondary | ICD-10-CM

## 2012-11-24 DIAGNOSIS — Z23 Encounter for immunization: Secondary | ICD-10-CM

## 2012-11-24 DIAGNOSIS — Z87891 Personal history of nicotine dependence: Secondary | ICD-10-CM

## 2012-11-24 DIAGNOSIS — J029 Acute pharyngitis, unspecified: Secondary | ICD-10-CM

## 2012-11-24 DIAGNOSIS — N951 Menopausal and female climacteric states: Secondary | ICD-10-CM

## 2012-11-24 HISTORY — DX: Other sleep disorders: G47.8

## 2012-11-24 NOTE — Assessment & Plan Note (Addendum)
A: chronic x 5 years. Worsening. Suspect some depression. Patient declines.  P:  Please do the following changes to improve sleep hygiene 1. Remove TV from room 2. Turn clock away from room (no ambient light) 3. Make sure the bedroom is for sleep and sex only.   Referred for sleep study Recommend full PHQ-9 at follow up

## 2012-11-24 NOTE — Assessment & Plan Note (Signed)
Improved. Patient quit smoking.

## 2012-11-24 NOTE — Assessment & Plan Note (Signed)
Resolved after cessation of coffee and smoking

## 2012-11-24 NOTE — Assessment & Plan Note (Addendum)
A: Declined off Effexor. Patient would not like to take another psychotropic medication. P: Regarding hot flashes/menopause Go for mammogram See me in follow up to discuss restarting hormone replacement therapy after your mammogram (request PCP change up front).

## 2012-11-24 NOTE — Patient Instructions (Addendum)
Shirley Howard,  Thank you for coming in to see me today.  Awesome job on smoking cessation!!! You and your husband rock.   Regarding your sleep: 1. Please do the following changes to improve sleep hygiene 1. Remove TV from room 2. Turn clock away from room (no ambient light) 3. Make sure the bedroom is for sleep and sex only. '  2. Sleep study   Regarding hot flashes/menopause Go for mammogram See me in follow up to discuss restarting hormone replacement therapy after your mammogram (request PCP change up front).     Dr. Armen Pickup

## 2012-11-24 NOTE — Progress Notes (Signed)
  Subjective:    Patient ID: Shirley Howard, female    DOB: 16-Dec-1960, 52 y.o.   MRN: [id number]  HPI 52 year old female with history of heavy smoking isn't yet menopausal symptoms presents for a physical:  #1 heavy smoking: Patient quit smoking on 11/04/2012. Since then her sore throat that was chronic has resolved. Her husband is also heavy smoker he is as well.  #2 menopausal symptoms: Patient stopped taking Effexor to taper herself off in the last dose was 9 days ago. She stopped because she fell it was not helping her menopausal symptoms, and worsen her fatigue. However cystoscopy sheath reports that her fatigue has worsened more her menopausal symptoms have worsened she has new onset anxiety and intermittent shortness of breath. See additional ROS below. She is not interested in restarting psychotropic medications.  #3 poor sleep: Patient reports that she has no problem falling asleep but significant difficulty staying asleep. She wakes up multiple times during the night. She said her husband is a light snorer. She sleeps in the bedroom with the TV and a limited alarm clock at her bedside. She reports that her weight has been increasing over the past year. She reports that she does snore.  Review of Systems Patient Information Form: Screening and ROS  AUDIT-C Score: 2 Do you feel safe in relationships? yes PHQ-2:positive  Review of Symptoms  General:  Negative for nexplained weight loss, fever Skin: Negative for new or changing mole, sore that won't heal HEENT: Positive hoarseness/changing in voice.  Negative for trouble hearing, trouble seeing, ringing in ears, mouth sores, dysphagia. CV:  Negative for chest pain, dyspnea, edema, palpitations Resp: Positive dyspnea.  Negative for cough,hemoptysis GI: Negative for nausea, vomiting, diarrhea, constipation, abdominal pain, melena, hematochezia. GU: Positive incontinence.  Negative for dysuria, urinary hesitance, hematuria, vaginal or  penile discharge, polyuria, sexual difficulty, lumps in testicle or breasts MSK: Negative for muscle cramps or aches, joint pain or swelling Neuro: Negative for headaches, weakness, numbness, dizziness, passing out/fainting Psych: Positive for anxiety and memory problems.  Negative for depression. Endo: Positive excessive thirst. Negative for frequent urination.     Objective: BP 112/58  Pulse 60  Temp(Src) 98.3 F (36.8 C) (Oral)  Ht 5\' 1"  (1.549 m)  Wt 158 lb (71.668 kg)  BMI 29.87 kg/m2    Physical Exam  Constitutional: She is oriented to person, place, and time. She appears well-developed and well-nourished. No distress.  Eyes: Conjunctivae are normal. Pupils are equal, round, and reactive to light.  Neck: Normal range of motion. Neck supple. No thyromegaly present.  Cardiovascular: Normal rate and regular rhythm.   Pulmonary/Chest: Breath sounds normal.  Abdominal: Soft. Bowel sounds are normal. She exhibits no distension and no mass. There is no tenderness. There is no rebound and no guarding.  Musculoskeletal: She exhibits no edema.  Neurological: She is alert and oriented to person, place, and time.  Skin: Skin is warm and dry.  Psychiatric: She has a normal mood and affect.      Assessment & Plan:

## 2012-11-25 ENCOUNTER — Other Ambulatory Visit: Payer: BC Managed Care – PPO

## 2012-11-25 DIAGNOSIS — E785 Hyperlipidemia, unspecified: Secondary | ICD-10-CM

## 2012-11-25 LAB — COMPREHENSIVE METABOLIC PANEL
BUN: 20 mg/dL (ref 6–23)
Calcium: 9.5 mg/dL (ref 8.4–10.5)
Potassium: 4.4 mEq/L (ref 3.5–5.3)
Sodium: 137 mEq/L (ref 135–145)
Total Protein: 6.6 g/dL (ref 6.0–8.3)

## 2012-11-25 LAB — LIPID PANEL: Total CHOL/HDL Ratio: 4.1 Ratio

## 2012-11-25 NOTE — Progress Notes (Signed)
CMP AND FLP DONE TODAY Shirley Howard 

## 2012-11-26 ENCOUNTER — Encounter: Payer: Self-pay | Admitting: Family Medicine

## 2012-11-26 NOTE — Assessment & Plan Note (Signed)
Hyperlipidemia with elevated LDL 10 yr CVD risk 4% if smoker indicated (patient is a recent ex-smoker) 1.6 % if no to smoking.    Plan to continue fish oil. No need for statin given , 5% risk.  Repeat lipids in one year as risk will increase with age.

## 2012-12-22 ENCOUNTER — Ambulatory Visit
Admission: RE | Admit: 2012-12-22 | Discharge: 2012-12-22 | Disposition: A | Discharge status: Home / Self Care | Payer: BC Managed Care – PPO | Source: Ambulatory Visit

## 2012-12-22 DIAGNOSIS — Z1231 Encounter for screening mammogram for malignant neoplasm of breast: Secondary | ICD-10-CM

## 2012-12-28 ENCOUNTER — Encounter (HOSPITAL_BASED_OUTPATIENT_CLINIC_OR_DEPARTMENT_OTHER): Payer: BC Managed Care – PPO

## 2013-01-05 ENCOUNTER — Encounter: Payer: Self-pay | Admitting: Family Medicine

## 2013-01-05 ENCOUNTER — Ambulatory Visit (INDEPENDENT_AMBULATORY_CARE_PROVIDER_SITE_OTHER): Payer: BC Managed Care – PPO | Admitting: Family Medicine

## 2013-01-05 VITALS — BP 120/64 | HR 77 | Temp 98.2°F | Ht 61.0 in | Wt 166.0 lb

## 2013-01-05 DIAGNOSIS — N951 Menopausal and female climacteric states: Secondary | ICD-10-CM

## 2013-01-05 DIAGNOSIS — G478 Other sleep disorders: Secondary | ICD-10-CM

## 2013-01-05 DIAGNOSIS — R635 Abnormal weight gain: Secondary | ICD-10-CM

## 2013-01-05 HISTORY — DX: Abnormal weight gain: R63.5

## 2013-01-05 MED ORDER — ESTROGENS CONJUGATED 0.3 MG PO TABS: 0.3000 mg | ORAL_TABLET | Freq: Every day | ORAL | Status: DC

## 2013-01-05 MED ORDER — PROGESTERONE MICRONIZED 100 MG PO CAPS: 100.0000 mg | ORAL_CAPSULE | Freq: Every day | ORAL | Status: DC

## 2013-01-05 NOTE — Assessment & Plan Note (Signed)
A: suspect age and smoking cessation contributing to weight gain. P: Check TSH, low suspicion of thyroid disorder recommend continued lifestyle modifications.

## 2013-01-05 NOTE — Assessment & Plan Note (Signed)
A: significant hot flashes. 4 years post menopausal.  P: I have ordered premarin (lowest dose oral estrogen) along with progestin (to protect the lining of your uterus and prevent endometrial thickening and cancer) for your hot flashes. Although progestin can cause irritability, nausea and headache when you first start the medication. It is important to take it.  Please take them both daily. It is not safe to take premarin alone.

## 2013-01-05 NOTE — Assessment & Plan Note (Signed)
Sleep has improved. Patient has elected not to so a sleep study due to cost, $2,000 per patient, and lack of need.

## 2013-01-05 NOTE — Patient Instructions (Signed)
Spenser,  Thank you very much for coming in to see me today.  I have ordered premarin (lowest dose oral estrogen) along with progestin (to protect the lining of your uterus and prevent endometrial thickening and cancer) for your hot flashes. Although progestin can cause irritability, nausea and headache when you first start the medication. It is important to take it.  Please take them both daily. It is not safe to take premarin alone.   My staff will be in touch with TSH results.   Dr. Armen Pickup

## 2013-01-05 NOTE — Progress Notes (Signed)
   Subjective:    Patient ID: Shirley Howard, female    DOB: 1960-05-26, 52 y.o.   MRN: [id number] CC: hot flashes  HPI 52 yo F presents for f/u visit to discuss the following:  1. Hot flashes: menopause age 25. Patient with daily (q 30 minutes or so) and nightly hot flashes. No improvement since she stopped smoking 9 weeks ago. Denies mood changes. Denies painful intercourse. Denies vaginal bleeding. She took premarin for a little over a year many years ago. She denies personal and family history of breast and endometrial cancer. She is interested in restarting hormone replacement therapy.   2. Weight gain: gaining weight. She is not used to being heavy. Would like to lose weight. 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 Has changed her diet to include more greens and cut out smoking, caffeine. Her mother has been recently diagnosed with a thyroid disorder at age 66.   3. Poor sleep: has improved. No longer a problem per patient. She has decided not to have a sleep study due to cost, $2,000 and because she does not feel like it is necessary.   Review of Systems As per HPI     Objective:   Physical Exam BP 120/64  Pulse 77  Temp(Src) 98.2 F (36.8 C) (Oral)  Ht 5\' 1"  (1.549 m)  Wt 166 lb (75.297 kg)  BMI 31.38 kg/m2 General appearance: alert, cooperative and no distress Neck: no adenopathy, no carotid bruit, no JVD, supple, symmetrical, trachea midline and thyroid not enlarged, symmetric, no tenderness/mass/nodules Lungs: clear to auscultation bilaterally Pulses: 2+ and symmetric    Assessment & Plan:

## 2013-01-08 ENCOUNTER — Telehealth: Payer: Self-pay | Admitting: Family Medicine

## 2013-01-08 NOTE — Telephone Encounter (Signed)
Saw Dr Armen Pickup 12/1. Will fwd note.  Leona Singleton, MD

## 2013-01-08 NOTE — Telephone Encounter (Signed)
Pt called and would like someone to call her with the lab results. jw °

## 2013-01-09 NOTE — Telephone Encounter (Signed)
Pt called back and would like dr. Armen Pickup to call her with her recent blood work done. jw

## 2013-01-12 NOTE — Telephone Encounter (Signed)
Called patient back. Apologized for delay in call. Normal tsh. Patient has started hormone replacement therapy. So far, so good. She will call if a higher dose is needed.   Reiterated the importance of ambulation and exercise during long haul trips to prevent DVT. Patient is aware.   F/u in 3 months.

## 2013-02-02 ENCOUNTER — Telehealth: Payer: Self-pay | Admitting: Family Medicine

## 2013-02-02 DIAGNOSIS — N951 Menopausal and female climacteric states: Secondary | ICD-10-CM

## 2013-02-02 MED ORDER — ESTROGENS CONJUGATED 0.45 MG PO TABS: 0.4250 mg | ORAL_TABLET | Freq: Every day | ORAL | Status: DC

## 2013-02-02 NOTE — Telephone Encounter (Signed)
Pt informed. Fleeger, Jessica Dawn  

## 2013-02-02 NOTE — Telephone Encounter (Signed)
Pt called to let Dr. Armen Pickup that the current dosage of Estrogen is not working and was told to call and let her know and that they could up the dosage. jw

## 2013-02-02 NOTE — Telephone Encounter (Signed)
Increased premarin dose sent in 0.425 mg daily.  Please inform patient.

## 2013-03-30 ENCOUNTER — Other Ambulatory Visit: Payer: Self-pay | Admitting: Family Medicine

## 2013-05-11 ENCOUNTER — Ambulatory Visit (INDEPENDENT_AMBULATORY_CARE_PROVIDER_SITE_OTHER): Payer: BC Managed Care – PPO | Admitting: Family Medicine

## 2013-05-11 ENCOUNTER — Encounter: Payer: Self-pay | Admitting: Family Medicine

## 2013-05-11 VITALS — BP 108/74 | HR 57 | Temp 98.1°F | Wt 166.0 lb

## 2013-05-11 DIAGNOSIS — N951 Menopausal and female climacteric states: Secondary | ICD-10-CM

## 2013-05-11 MED ORDER — PROGESTERONE MICRONIZED 100 MG PO CAPS: 100.0000 mg | ORAL_CAPSULE | Freq: Every day | ORAL | Status: DC

## 2013-05-11 MED ORDER — ESTROGENS CONJUGATED 0.45 MG PO TABS: ORAL_TABLET | ORAL | Status: DC

## 2013-05-11 NOTE — Assessment & Plan Note (Signed)
A: tolerating medication. Improvement in symptoms. With minimal hot flashes and abile to sleeep. P: refill hormones x 6 months supply.  F/u in 6 months.

## 2013-05-11 NOTE — Progress Notes (Signed)
   Subjective:    Patient ID: Shirley Howard, female    DOB: 01/27/1961, 53 y.o.   MRN: [id number]  HPI 53 yo F presents for f/u visit:  1. Hormone replacement therapy: tolerating medication. Would like refills. Sleeping through night. Minimal hot flashes. Good sex drive. No vaginal bleeding. Stopped smoking 6  Months ago.    Review of Systems As per HPI      Objective:   Physical Exam BP 108/74  Pulse 57  Temp(Src) 98.1 F (36.7 C) (Oral)  Wt 166 lb (75.297 kg) Wt Readings from Last 3 Encounters:  05/11/13 166 lb (75.297 kg)  01/05/13 166 lb (75.297 kg)  11/24/12 158 lb (71.668 kg)  General appearance: alert, cooperative and no distress Lungs: clear to auscultation bilaterally Heart: regular rate and rhythm, S1, S2 normal, no murmur, click, rub or gallop       Assessment & Plan:

## 2013-05-11 NOTE — Patient Instructions (Signed)
Shirley Howard,  Thank you for coming in today. Your exam is normal. Excellent job on  Quitting smoking.   I have refilled your hormones, 90 with 1 refill for 3 months supply.  Dr. Adrian Blackwater

## 2013-11-18 ENCOUNTER — Telehealth: Payer: Self-pay | Admitting: Family Medicine

## 2013-11-18 NOTE — Telephone Encounter (Signed)
Patient husband informed, expressed understanding.

## 2013-11-18 NOTE — Telephone Encounter (Signed)
Pt called and would like a prescription sent to [facility] on [address] for the shingles booster. jw

## 2013-11-18 NOTE — Telephone Encounter (Signed)
Note no nursing staff - please call patient and tell her that Shingles vaccine is not recommended until the age of 84, if she has questions I am happy to address at our next visit together

## 2013-11-25 ENCOUNTER — Other Ambulatory Visit: Payer: Self-pay | Admitting: Family Medicine

## 2013-11-30 ENCOUNTER — Encounter: Payer: BC Managed Care – PPO | Admitting: Family Medicine

## 2013-11-30 ENCOUNTER — Ambulatory Visit (INDEPENDENT_AMBULATORY_CARE_PROVIDER_SITE_OTHER): Payer: BC Managed Care – PPO | Admitting: Family Medicine

## 2013-11-30 ENCOUNTER — Encounter: Payer: Self-pay | Admitting: Family Medicine

## 2013-11-30 VITALS — BP 112/71 | HR 60 | Temp 98.2°F | Ht 61.0 in | Wt 173.2 lb

## 2013-11-30 DIAGNOSIS — Z0000 Encounter for general adult medical examination without abnormal findings: Secondary | ICD-10-CM

## 2013-11-30 DIAGNOSIS — J029 Acute pharyngitis, unspecified: Secondary | ICD-10-CM

## 2013-11-30 DIAGNOSIS — N951 Menopausal and female climacteric states: Secondary | ICD-10-CM

## 2013-11-30 MED ORDER — OMEPRAZOLE 20 MG PO CPDR: 20.0000 mg | DELAYED_RELEASE_CAPSULE | Freq: Every day | ORAL | Status: DC

## 2013-11-30 NOTE — Patient Instructions (Signed)
It was nice to meet you today.  Please return in January when you have insurance to get your yearly GYN Exam.  You will be due for a mammogram in November of 2016.  Please return in January for Pneumonia vaccine.  Up to date on colon cancer screening.   Continue to take omeprazole 20 mg daily, can stop vitamin C and B complex.

## 2013-12-02 ENCOUNTER — Encounter: Payer: Self-pay | Admitting: Family Medicine

## 2013-12-02 NOTE — Assessment & Plan Note (Signed)
Patient has improved menopausal symptoms with hormone replacement therapy. Previously unable to tolerate off hormones. Discussed the risks/benefits of this therapy with patient. -continue current therapy

## 2013-12-02 NOTE — Progress Notes (Signed)
Patient ID: Shirley Howard, female   DOB: 1960-07-29, 53 y.o.   MRN: [id number] 53 y.o. year old female presents for well woman/preventative visit.   Acute Concerns: patient has questions about which multivitamins to take, current on Multivitamin, vit D, vit D, B-complex, and fiber supplementation; recently took new job and does not have insurance coverage for 90 days, would like to defer GYN exam/pap/labs until January 2016  Diet: multiple servings of fruits and vegetables per day, tries to avoid salt, mostly chicken and fish, red meat only once per week  Exercise: recently joined gym, plans to go 3-4 times per week  Sexual/Birth History: 2 children, postmenopausal, sexually active with husband  Birth Control: postmenopausal, currently on hormone supplementation, has attempted trial off approximately one year ago ans was not able to tolerate hot flashes, currently has one hot flash per day, symptoms are tolerable  POA/Living Will: No living will/POA, wishes for husband to make medical decisions  Social:  History   Social History  . Marital Status: Married    Spouse Name: N/A    Number of Children: N/A  . Years of Education: N/A   Social History Main Topics  . Smoking status: Former Smoker -- 1.00 packs/day    Types: Cigarettes    Quit date: 11/04/2012  . Smokeless tobacco: Never Used  . Alcohol Use: Yes     Comment: social users   . Drug Use: No  . Sexual Activity: Yes    Partners: Male     Comment: Husband   Other Topics Concern  . None   Social History Narrative   Lives with husband in [place].   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/2013)             Immunization:  Tdap/TD: 2010  Influenza: 2015  Pneumococcal: declined today due to lack of insurance currently  Herpes Zoster: no yet 58  Cancer Screening:  Pap Smear: uncertain, at least 5 years ago, no history of  abnormal pap  Mammogram: 12/2012, no breast issues  Colonoscopy: 2012 (one polyp), due for repeat in 2017  Dexa: not yet a candidate  Physical Exam: VITALS: reviewed GEN: pleasant Caucasian female, NAD HEENT: Normocephalic, PERRL, EOMI, no scleral icterus, nasal septum midline, no rhinorrhea, MMM, uvula midline, no pharyngeal erythema or exudate noted, neck supple, no thyromegaly, no adenopathy CARDIAC: RRR, S1 and S2 present, no murmurs, no heaves/thrills RESP: CTAB, normal effort BREAST:Deferred, patient wishes to have GYN exam in January  ABD: soft, no tenderness, normal bowel sounds GU/GYN: Deferred, patient wishes to have GYN exam in January EXT:no edema SKIN: no rash, no suspicious skin lesions  ASSESSMENT & PLAN: 53 y.o. female presents for annual well woman/preventative exam. Please see problem specific assessment and plan.

## 2013-12-02 NOTE — Assessment & Plan Note (Signed)
53 y/o female presents for annual preventative visit. GYN exam deferred per patient request as currently without insurance. -discussed lifestyle modifications -reviewed medications and updated in EPIC -Up to date on immunizations other than PNA (will readdress when patient has insurance) -information on POA/Living will provided -Up to date on Colon Ca and Breast Ca screening, needs PAP at next visit

## 2013-12-26 ENCOUNTER — Other Ambulatory Visit: Payer: Self-pay | Admitting: Family Medicine

## 2014-03-08 ENCOUNTER — Encounter: Payer: Self-pay | Admitting: Family Medicine

## 2014-03-08 ENCOUNTER — Other Ambulatory Visit (HOSPITAL_COMMUNITY)
Admission: RE | Admit: 2014-03-08 | Discharge: 2014-03-08 | Disposition: A | Discharge status: Home / Self Care | Payer: No Typology Code available for payment source | Source: Ambulatory Visit | Attending: Family Medicine | Admitting: Family Medicine

## 2014-03-08 ENCOUNTER — Ambulatory Visit (INDEPENDENT_AMBULATORY_CARE_PROVIDER_SITE_OTHER): Payer: No Typology Code available for payment source | Admitting: Family Medicine

## 2014-03-08 VITALS — BP 136/76 | HR 66 | Temp 98.4°F | Ht 61.0 in | Wt 170.4 lb

## 2014-03-08 DIAGNOSIS — Z87891 Personal history of nicotine dependence: Secondary | ICD-10-CM

## 2014-03-08 DIAGNOSIS — J382 Nodules of vocal cords: Secondary | ICD-10-CM

## 2014-03-08 DIAGNOSIS — Z1151 Encounter for screening for human papillomavirus (HPV): Secondary | ICD-10-CM | POA: Diagnosis present

## 2014-03-08 DIAGNOSIS — Z124 Encounter for screening for malignant neoplasm of cervix: Secondary | ICD-10-CM

## 2014-03-08 DIAGNOSIS — Z23 Encounter for immunization: Secondary | ICD-10-CM

## 2014-03-08 DIAGNOSIS — Z01411 Encounter for gynecological examination (general) (routine) with abnormal findings: Secondary | ICD-10-CM | POA: Diagnosis not present

## 2014-03-08 DIAGNOSIS — Z0000 Encounter for general adult medical examination without abnormal findings: Secondary | ICD-10-CM

## 2014-03-08 HISTORY — DX: Nodules of vocal cords: J38.2

## 2014-03-08 NOTE — Assessment & Plan Note (Signed)
Patient reports no further smoking.

## 2014-03-08 NOTE — Assessment & Plan Note (Signed)
Breast and GYN exams completed today and normal. -pap smear obtained -patient referred for mammogram

## 2014-03-08 NOTE — Patient Instructions (Signed)
Your exam was normal today.  Dr. Ree Kida will call you with your pap smear results.  Please schedule a mammogram.

## 2014-03-08 NOTE — Assessment & Plan Note (Signed)
Patient reports vocal cord nodule in late 2014. Now reports feeling of throat fullness. No evidence of thyromegaly. - refer back to ENT for re-evaluation.

## 2014-03-08 NOTE — Progress Notes (Signed)
   Subjective:    Patient ID: Tyan Lasure, female    DOB: 1960/09/28, 54 y.o.   MRN: [id number]  HPI 54 y/o female presents for Breast/GYN exam. Recently had annual preventative exam however deferred this part of exam until she had insurance.  No recent breast issues, checks breasts monthly and no masses/nipple discharge. No recent GYN related issues.   Fullness in throat - patient reports 1-2 month history of throat fullness, no trouble swallowing, no sob or cough, does report history of vocal cord node diagnosed in late 2014 by ENT, would like referral back to ENT, denies recent smoking    Review of Systems  Constitutional: Negative for fever, chills and fatigue.  Respiratory: Negative for cough, shortness of breath and stridor.   Cardiovascular: Negative for chest pain.  Gastrointestinal: Negative for nausea, vomiting and diarrhea.       Objective:   Physical Exam Vitals: reviewed Gen: pleasant female, NAD HEENT: normocephalic, PERRL, EOMI, MMM, neck supple, no cervical adenopathy, no thyromegaly or masses Breast exam: chaperone present, no masses or nipple discharge, no axillar adenopathy Pelvic: normal appearance of vulva, cervix appeared normal on speculum exam, pap smear obtained, no masses appreciated on bimanual exam      Assessment & Plan:  Please see problem specific assessment and plan.

## 2014-03-10 LAB — CYTOLOGY - PAP

## 2014-03-12 ENCOUNTER — Telehealth: Payer: Self-pay | Admitting: Family Medicine

## 2014-03-12 DIAGNOSIS — R87610 Atypical squamous cells of undetermined significance on cytologic smear of cervix (ASC-US): Secondary | ICD-10-CM | POA: Insufficient documentation

## 2014-03-12 NOTE — Telephone Encounter (Signed)
Discussed ASCUS and negative HPV. Per ASCCP guidelines repeat cotesting in 3 years.

## 2014-04-02 ENCOUNTER — Other Ambulatory Visit: Payer: Self-pay | Admitting: Family Medicine

## 2014-04-02 DIAGNOSIS — J029 Acute pharyngitis, unspecified: Secondary | ICD-10-CM

## 2014-04-02 MED ORDER — FLUTICASONE PROPIONATE 50 MCG/ACT NA SUSP: 2.0000 | Freq: Every day | NASAL | Status: AC

## 2014-04-02 MED ORDER — OMEPRAZOLE 40 MG PO CPDR: DELAYED_RELEASE_CAPSULE | ORAL | Status: DC

## 2014-05-29 ENCOUNTER — Other Ambulatory Visit: Payer: Self-pay | Admitting: Family Medicine

## 2014-07-02 ENCOUNTER — Other Ambulatory Visit: Payer: Self-pay | Admitting: Family Medicine

## 2014-10-18 ENCOUNTER — Other Ambulatory Visit: Payer: Self-pay

## 2014-10-18 DIAGNOSIS — R5381 Other malaise: Secondary | ICD-10-CM

## 2014-10-18 DIAGNOSIS — Z1231 Encounter for screening mammogram for malignant neoplasm of breast: Secondary | ICD-10-CM

## 2014-10-22 ENCOUNTER — Other Ambulatory Visit: Payer: Self-pay | Admitting: Family Medicine

## 2014-10-22 ENCOUNTER — Ambulatory Visit (INDEPENDENT_AMBULATORY_CARE_PROVIDER_SITE_OTHER): Payer: BLUE CROSS/BLUE SHIELD | Admitting: Family Medicine

## 2014-10-22 ENCOUNTER — Encounter: Payer: Self-pay | Admitting: Family Medicine

## 2014-10-22 VITALS — BP 137/75 | HR 66 | Temp 98.7°F | Ht 61.0 in | Wt 158.0 lb

## 2014-10-22 DIAGNOSIS — N6320 Unspecified lump in the left breast, unspecified quadrant: Secondary | ICD-10-CM | POA: Insufficient documentation

## 2014-10-22 DIAGNOSIS — N951 Menopausal and female climacteric states: Secondary | ICD-10-CM | POA: Diagnosis not present

## 2014-10-22 DIAGNOSIS — N63 Unspecified lump in breast: Secondary | ICD-10-CM | POA: Diagnosis not present

## 2014-10-22 HISTORY — DX: Unspecified lump in the left breast, unspecified quadrant: N63.20

## 2014-10-22 NOTE — Progress Notes (Signed)
   Subjective:    Patient ID: Shirley Howard, female    DOB: [birth year], 54 y.o.   MRN: [id number]  HPI 54 year old female presents for evaluation of left breast mass.  Patient notes left-sided breast tenderness and breast mass since 10/13/2014., She first noticed the mass when turning over in bed, no associated nipple discharge or bleeding, no previous personal history of breast cancer, no previous family history of breast cancer  Last mammogram was completed in November 2014 was negative for malignancy   Review of Systems  Constitutional: Negative for fever, chills and fatigue.  Respiratory: Negative for cough and shortness of breath.   Cardiovascular: Negative for chest pain.  Patient does report night sweats however she makes this is related to her history of hot flashes from her menopausal syndrome     Objective:   Physical Exam Vitals: Reviewed Gen.: Pleasant female, no acute distress Cardiac: Regular rate and rhythm, S1 and S2 present, no murmurs, no heaves or thrills Respiratory: Clear to auscultation bilaterally, normal effort Breast exam: Chaperone present, right breast- no breast mass, no nipple discharge, no axillary adenopathy; left breast-approximate 2 cm x 2 cm mobile hard mass at the 3:00 position, no overlying skin changes, no nipple discharge, no nipple bleeding, no axillary adenopathy       Assessment & Plan:  Please see problem specific assessment and plan.

## 2014-10-22 NOTE — Assessment & Plan Note (Signed)
Stopped HRT due to left breast mass Patient declined other therapies at this time for hot flashes

## 2014-10-22 NOTE — Assessment & Plan Note (Addendum)
Approx. 2 cm by 2 cm mobile hard mass of left breast at 3 oclock position -will send for diagnostic mammogram  -stopped hormone replacement therapy in case of possible malignancy

## 2014-10-22 NOTE — Patient Instructions (Signed)
It was nice to see you today.  You have been scheduled for a diagnostic mammogram to further evaluate your left breast.

## 2014-10-28 ENCOUNTER — Ambulatory Visit
Admission: RE | Admit: 2014-10-28 | Discharge: 2014-10-28 | Disposition: A | Discharge status: Home / Self Care | Payer: BLUE CROSS/BLUE SHIELD | Source: Ambulatory Visit | Attending: Family Medicine | Admitting: Family Medicine

## 2014-10-28 ENCOUNTER — Other Ambulatory Visit: Payer: Self-pay | Admitting: Family Medicine

## 2014-10-28 DIAGNOSIS — N6320 Unspecified lump in the left breast, unspecified quadrant: Secondary | ICD-10-CM

## 2014-11-01 ENCOUNTER — Other Ambulatory Visit: Payer: Self-pay | Admitting: Family Medicine

## 2014-11-01 ENCOUNTER — Ambulatory Visit
Admission: RE | Admit: 2014-11-01 | Discharge: 2014-11-01 | Disposition: A | Discharge status: Home / Self Care | Payer: BLUE CROSS/BLUE SHIELD | Source: Ambulatory Visit | Attending: Family Medicine | Admitting: Family Medicine

## 2014-11-01 DIAGNOSIS — N6320 Unspecified lump in the left breast, unspecified quadrant: Secondary | ICD-10-CM

## 2014-11-03 ENCOUNTER — Telehealth: Payer: Self-pay | Admitting: *Deleted

## 2014-11-03 NOTE — Telephone Encounter (Signed)
Left message for a return phone call to schedule for [facility] 10/5

## 2014-11-04 ENCOUNTER — Telehealth: Payer: Self-pay | Admitting: *Deleted

## 2014-11-04 DIAGNOSIS — C50412 Malignant neoplasm of upper-outer quadrant of left female breast: Secondary | ICD-10-CM

## 2014-11-04 HISTORY — DX: Malignant neoplasm of upper-outer quadrant of left female breast: C50.412

## 2014-11-04 NOTE — Telephone Encounter (Signed)
Confirmed [facility] for 11/10/14 at 830am .  Instructions and contact information given.

## 2014-11-09 NOTE — Progress Notes (Addendum)
[place]  [phone] Fax:([phone] [place] Note   Patient Care Team: Lupita Dawn, MD as PCP - General (Family Medicine) Stark Klein, MD as Consulting Physician (General Surgery) Truitt Merle, MD as Consulting Physician (Hematology) Arloa Koh, MD as Consulting Physician (Radiation Oncology) Rockwell Germany, RN as Registered Nurse Mauro Kaufmann, RN as Registered Nurse Sylvan Cheese, NP as Nurse Practitioner (Nurse Practitioner) 11/10/2014  CHIEF COMPLAINTS/PURPOSE OF CONSULTATION:  Newly diagnosed left breast cancer  Oncology History   Breast cancer of upper-outer quadrant of left female breast [facility])   Staging form: Breast, AJCC 7th Edition     Clinical stage from 11/10/2014: Stage IIA (T2, N0, M0) - Signed by Truitt Merle, MD on 11/10/2014       Breast cancer of upper-outer quadrant of left female breast ([place])   10/28/2014 Mammogram Screening and the diagnostic mammogram shows a 2.1 x 1.3 x 2.1 cm it irregular mass in the left breast 2:00 position, 2 cm from the nipple.   11/01/2014 Receptors her2 ER 100%, PR100%+, HER2-, KI67 60%    11/01/2014 Initial Biopsy left breast biopsy showed invasive ductal carcinoma, grade 2, and DCIS    11/04/2014 Initial Diagnosis Breast cancer of upper-outer quadrant of left female breast ([place])    HISTORY OF PRESENTING ILLNESS:  Shirley Howard 54 y.o. female is here because of her recently diagnosed left breast cancer. She is accompanied by her husband, daughter, mother to our multidisciplinary breast clinic today.  She felt a lump in her left breast one  Month ago, mild tender, she otherwise feels well no other new symptoms. This was developed by her primary care physician and screening mammogram was obtained. Mammogram reviewed a 2.1 cm mass in the left breast 2:00 position, core needle biopsy showed invasive ductal carcinoma, and DCIS.  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 She feels well overall. She does have mild low back pain, does not take any education. She is physically active at home, but does not exercise regularly. No family history of breast cancer. She was on hormone replacement for the past 8 years, and just stopped progesterone a few weeks ago.  MEDICAL HISTORY:  Past Medical History  Diagnosis Date  . Wrist pain   . Sore throat 10/27/2012    10/31/12 [facility] (see scanned document) - vocal cord polyp left, bilateral edema, significant postcricoid edema, c/w chronic tobacco use and chronic reflux. - Recheck 2 months - Encouraged tobacco cessation to reduce risk of laryngeal cancer     . Anxiety   . Depression   . Hot flashes     SURGICAL HISTORY: Past Surgical History  Procedure Laterality Date  . Cyst removal hand    . Tubal ligation  1995   GYN HISTORY  Menarchal: 10 LMP: 48 Contraceptive: 16 HRT: 8 years, stopped in 10/2014  G2P2:    SOCIAL HISTORY: Social History   Social History  . Marital Status: Married    Spouse Name: N/A  . Number of Children: N/A  . Years of Education: N/A   Occupational History  . Not on file.   Social History Main Topics  . Smoking status: Former Smoker -- 1.50 packs/day for 40 years    Types: Cigarettes    Quit date: 11/04/2012  . Smokeless tobacco: Never Used  . Alcohol Use: Yes     Comment: social users   . Drug Use: No  . Sexual Activity:  Partners: Male     Comment: Husband   Other Topics Concern  . Not on file   Social History Narrative   Lives with husband in [place].   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 Supraventricular tachycardia Mother   . Thyroid disease Mother 25    unsure what type   . Cancer Father     throat cancer - smoker  . Stroke Father     ALLERGIES:  is  allergic to codeine.  MEDICATIONS:  Current Outpatient Prescriptions  Medication Sig Dispense Refill  . calcium carbonate (OS-CAL) 600 MG TABS tablet Take 600 mg by mouth daily with breakfast.    . cholecalciferol (VITAMIN D) 1000 UNITS tablet Take 2,000 Units by mouth daily.    . diphenhydrAMINE (SOMINEX) 25 MG tablet Take 25 mg by mouth at bedtime as needed for sleep.    . fish oil-omega-3 fatty acids 1000 MG capsule Take 2 g by mouth daily.    . fluticasone (FLONASE) 50 MCG/ACT nasal spray Place 2 sprays into both nostrils daily. 16 g 0  . Multiple Vitamin (MULTIVITAMIN) tablet Take 1 tablet by mouth daily.    Marland Kitchen omeprazole (PRILOSEC) 40 MG capsule Take one tablet daily.    Marland Kitchen ALPRAZolam (XANAX) 0.25 MG tablet Take 1 tablet (0.25 mg total) by mouth at bedtime as needed for anxiety or sleep. 30 tablet 0  . aspirin 81 MG tablet Take 81 mg by mouth daily.     No current facility-administered medications for this visit.    REVIEW OF SYSTEMS:   Constitutional: Denies fevers, chills or abnormal night sweats Eyes: Denies blurriness of vision, double vision or watery eyes Ears, nose, mouth, throat, and face: Denies mucositis or sore throat Respiratory: Denies cough, dyspnea or wheezes Cardiovascular: Denies palpitation, chest discomfort or lower extremity swelling Gastrointestinal:  Denies nausea, heartburn or change in bowel habits Skin: Denies abnormal skin rashes Lymphatics: Denies new lymphadenopathy or easy bruising Neurological:Denies numbness, tingling or new weaknesses Behavioral/Psych: Mood is stable, no new changes  All other systems were reviewed with the patient and are negative.  PHYSICAL EXAMINATION: ECOG PERFORMANCE STATUS: 0 - Asymptomatic  Filed Vitals:   11/10/14 0844  BP: 157/76  Pulse: 68  Temp: 98.2 F (36.8 C)  Resp: 18   Filed Weights   11/10/14 0844  Weight: 158 lb 9.6 oz (71.94 kg)    GENERAL:alert, no distress and comfortable SKIN: skin color,  texture, turgor are normal, no rashes or significant lesions EYES: normal, conjunctiva are pink and non-injected, sclera clear OROPHARYNX:no exudate, no erythema and lips, buccal mucosa, and tongue normal  NECK: supple, thyroid normal size, non-tender, without nodularity LYMPH:  no palpable lymphadenopathy in the cervical, axillary or inguinal LUNGS: clear to auscultation and percussion with normal breathing effort HEART: regular rate & rhythm and no murmurs and no lower extremity edema ABDOMEN:abdomen soft, non-tender and normal bowel sounds Musculoskeletal:no cyanosis of digits and no clubbing  PSYCH: alert & oriented x 3 with fluent speech NEURO: no focal motor/sensory deficits Breasts: Breast inspection showed them to be symmetrical with no nipple discharge. There is a 2.5X3cm mass in upper outer quadrant of left breast. Palpation of the right breast and bilateral axilla revealed no obvious mass that I could appreciate.  LABORATORY DATA:  I have reviewed the data as listed Lab Results  Component Value Date   WBC 8.7 11/10/2014   HGB 14.4 11/10/2014   HCT 41.4 11/10/2014   MCV 89.1 11/10/2014   PLT 313 11/10/2014    Recent Labs  11/10/14 0832  NA 141  K 4.1  CO2 28  GLUCOSE 89  BUN 11.4  CREATININE 0.8  CALCIUM 9.5  PROT 7.1  ALBUMIN 4.0  AST 18  ALT 25  ALKPHOS 25  BILITOT 0.40    Pathology report Diagnosis 11/01/2014 Breast, left, needle core biopsy, 2:00 o'clock - INVASIVE MAMMARY CARCINOMA, SEE COMMENT. - MAMMARY CARCINOMA IN SITU. Microscopic Comment Although grade of tumor is best assessed at resection, with these biopsies, both the invasive and in situ carcinoma are grade 2.  Both the in situ and invasive carcinoma demonstrates strong diffuse E-cadherin expression supporting a ductal phenotype. (CRR:gt, 11/03/14)  Results: IMMUNOHISTOCHEMICAL AND MORPHOMETRIC ANALYSIS PERFORMED MANUALLY Estrogen Receptor: 100%, POSITIVE, STRONG STAINING  INTENSITY Progesterone Receptor: 100%, POSITIVE, STRONG STAINING INTENSITY Proliferation Marker Ki67: 60% Results: HER2 - NEGATIVE RATIO OF HER2/CEP17 SIGNALS 1.00 AVERAGE HER2 COPY NUMBER PER CELL 2.95  RADIOGRAPHIC STUDIES: I have personally reviewed the radiological images as listed and agreed with the findings in the report. Mm Digital Diagnostic Unilat L  11/01/2014   CLINICAL DATA:  Status post ultrasound-guided core biopsy of mass in the 2 o'clock location of the left breast.  EXAM: DIAGNOSTIC LEFT MAMMOGRAM POST ULTRASOUND BIOPSY  COMPARISON:  Previous exam(s).  FINDINGS: Mammographic images were obtained following ultrasound guided biopsy of mass in the 2 o'clock location of the left breast. A ribbon shaped clip is identified in the upper-outer quadrant of the left breast as expected following biopsy.  IMPRESSION: Tissue marker clip is in expected location after biopsy.  Final Assessment: Post Procedure Mammograms for Marker Placement   Electronically Signed   By: Nolon Nations M.D.   On: 11/01/2014 15:24   US Breast Ltd Uni Left Inc Axilla  10/28/2014   CLINICAL DATA:  Patient presents today with a palpable mass within the upper-outer quadrant of the left breast.  EXAM: DIGITAL DIAGNOSTIC BILATERAL MAMMOGRAM WITH 3D TOMOSYNTHESIS WITH CAD  ULTRASOUND LEFT BREAST  COMPARISON:  Previous exams including most recent screening mammogram of 12/22/2012.  ACR Breast Density Category c: The breast tissue is heterogeneously dense, which may obscure small masses.  FINDINGS: Bilateral CC and MLO projections were obtained with tomosynthesis. Additional spot compression views, with tomosynthesis, were obtained for the upper-outer quadrant palpable abnormality.  There is a spiculated mass within the upper-outer quadrant of the left breast, at posterior depth, confirmed on spot compression views with tomosynthesis, measuring approximately 2 cm greatest dimension, corresponding to the palpable abnormality.   There are no other dominant masses, suspicious calcifications or secondary signs of malignancy identified within either breast.  Mammographic images were processed with CAD.  On physical exam, hard fixed mass is felt within the upper-outer quadrant of the left breast.  Targeted ultrasound is performed, showing an irregular hypoechoic mass within the left breast at the 2 o'clock axis, 2 cm from nipple, measuring 2.1 x 1.3 x 2.1 cm, corresponding to the mammographic finding and palpable abnormality.  Left axilla was evaluated with ultrasound. No enlarged or morphologically abnormal lymph nodes are identified in the left axilla.  IMPRESSION: 1. Irregular mass within the left breast at the 2 o'clock axis, 2 cm from the nipple, measuring 2.1 x 1.3 x 2.1 cm, corresponding to the  palpable abnormality. This is a highly suspicious finding for which ultrasound-guided biopsy is recommended.  2. No enlarged or morphologically abnormal lymph nodes identified within the left axilla.  RECOMMENDATION: Ultrasound-guided core biopsy of the left breast mass at the 2 o'clock axis, 2 cm from the nipple, measuring 2.1 x 1.3 x 2.1 cm.  Ultrasound-guided core biopsy is scheduled for September 26th at 1 p.m.  I have discussed the findings and recommendations with the patient. Results were also provided in writing at the conclusion of the visit. If applicable, a reminder letter will be sent to the patient regarding the next appointment.  BI-RADS CATEGORY  5: Highly suggestive of malignancy.   Electronically Signed   By: Franki Cabot M.D.   On: 10/28/2014 15:35   Mm Diag Breast Tomo Bilateral  10/28/2014   CLINICAL DATA:  Patient presents today with a palpable mass within the upper-outer quadrant of the left breast.  EXAM: DIGITAL DIAGNOSTIC BILATERAL MAMMOGRAM WITH 3D TOMOSYNTHESIS WITH CAD  ULTRASOUND LEFT BREAST  COMPARISON:  Previous exams including most recent screening mammogram of 12/22/2012.  ACR Breast Density Category c: The  breast tissue is heterogeneously dense, which may obscure small masses.  FINDINGS: Bilateral CC and MLO projections were obtained with tomosynthesis. Additional spot compression views, with tomosynthesis, were obtained for the upper-outer quadrant palpable abnormality.  There is a spiculated mass within the upper-outer quadrant of the left breast, at posterior depth, confirmed on spot compression views with tomosynthesis, measuring approximately 2 cm greatest dimension, corresponding to the palpable abnormality.  There are no other dominant masses, suspicious calcifications or secondary signs of malignancy identified within either breast.  Mammographic images were processed with CAD.  On physical exam, hard fixed mass is felt within the upper-outer quadrant of the left breast.  Targeted ultrasound is performed, showing an irregular hypoechoic mass within the left breast at the 2 o'clock axis, 2 cm from nipple, measuring 2.1 x 1.3 x 2.1 cm, corresponding to the mammographic finding and palpable abnormality.  Left axilla was evaluated with ultrasound. No enlarged or morphologically abnormal lymph nodes are identified in the left axilla.  IMPRESSION: 1. Irregular mass within the left breast at the 2 o'clock axis, 2 cm from the nipple, measuring 2.1 x 1.3 x 2.1 cm, corresponding to the palpable abnormality. This is a highly suspicious finding for which ultrasound-guided biopsy is recommended.  2. No enlarged or morphologically abnormal lymph nodes identified within the left axilla.  RECOMMENDATION: Ultrasound-guided core biopsy of the left breast mass at the 2 o'clock axis, 2 cm from the nipple, measuring 2.1 x 1.3 x 2.1 cm.  Ultrasound-guided core biopsy is scheduled for September 26th at 1 p.m.  I have discussed the findings and recommendations with the patient. Results were also provided in writing at the conclusion of the visit. If applicable, a reminder letter will be sent to the patient regarding the next  appointment.  BI-RADS CATEGORY  5: Highly suggestive of malignancy.   Electronically Signed   By: Franki Cabot M.D.   On: 10/28/2014 15:35   [place] Lt Breast Bx W Loc Dev 1st Lesion Img Bx Spec US Guide  11/02/2014   ADDENDUM REPORT: 11/02/2014 14:40 ADDENDUM: Pathology reveals Grade II invasive mammary carcinoma and mammary carcinoma in situ of the Left breast. This was found to be concordant by Dr. Shon Hale. Pathology result were discussed with the patient via telephone. The patient reported tenderness at the biopsy site and is doing well otherwise. Post biopsy care  and instructions were reviewed and questions were answered. The patient was encouraged to call [facility] with any additional questions and or concerns. The patient was referred to [facility] on November 10, 2014. Pathology results reported by Terie Purser RN on November 02, 2014. Electronically Signed   By: Nolon Nations M.D.   On: 11/02/2014 14:40  11/02/2014   CLINICAL DATA:  Patient returns for ultrasound-guided core biopsy of left breast mass.  EXAM: ULTRASOUND GUIDED LEFT BREAST CORE NEEDLE BIOPSY  COMPARISON:  Previous exam(s).  FINDINGS: I met with the patient and we discussed the procedure of ultrasound-guided biopsy, including benefits and alternatives. We discussed the high likelihood of a successful procedure. We discussed the risks of the procedure, including infection, bleeding, tissue injury, clip migration, and inadequate sampling. Informed written consent was given. The usual time-out protocol was performed immediately prior to the procedure.  Using sterile technique and 2% Lidocaine as local anesthetic, under direct ultrasound visualization, a 12 gauge spring-loaded device was used to perform biopsy of mass in the 2 o'clock location of the left breast using a lateral approach. At the conclusion of the procedure a ribbon  shaped tissue marker clip was deployed into the biopsy cavity. Follow up 2 view mammogram was performed and dictated separately.  IMPRESSION: Ultrasound guided biopsy of left breast mass. No apparent complications.  Electronically Signed: By: Nolon Nations M.D. On: 11/01/2014 15:23    ASSESSMENT & PLAN:  54 year old Caucasian female, postmenopausal, presented with palpable left breast mass  1. Left breast invasive ductal carcinoma, grade 2, ER positive, PR positive, HER-2 negative, (+) DCIS -I reviewed her mammograms, ultrasound findings, and the needle biopsy results in great details. -She is a candidate for lumpectomy, she was seen by surgeon Dr. Barry Dienes today, and will proceed with surgery soon. -I recommend Oncotype DX test to further stratify the risk of recurrence after surgery. Given her T2 lesion, high Ki-67, I suspect she may have high risk recurrence score. -Giving her strong ER/PR positivity, I recommend adjuvant endocrine therapy with aromatase inhibitor for 5-10 years. -She would also need adjuvant breast irradiation for local control, she was seen by radiation oncologist Dr. Valere Dross today  2. Anxiety and depression -She has been having anxiety and insomnia since her cancer diagnosis -I give her a prescription xanax today. I think her anxiety level has dropped after her extensive discussion with [place], and our reassurance that she will likely do very well for her early stage breast cancer.  Plan -She will have lumpectomy and sentinel lymph node biopsies soon -Oncotype DX if her lymph nodes negative -I'll see her back in 3 weeks after her surgery to discuss Oncotype DX results.  All questions were answered. The patient knows to call the clinic with any problems, questions or concerns. I spent 55 minutes counseling the patient face to face. The total time spent in the appointment was 60 minutes and more than 50% was on counseling.     Truitt Merle, MD 11/10/2014 3:37 PM

## 2014-11-10 ENCOUNTER — Encounter: Payer: Self-pay | Admitting: *Deleted

## 2014-11-10 ENCOUNTER — Encounter: Payer: Self-pay | Admitting: Nurse Practitioner

## 2014-11-10 ENCOUNTER — Encounter: Payer: Self-pay | Admitting: Skilled Nursing Facility1

## 2014-11-10 ENCOUNTER — Other Ambulatory Visit (HOSPITAL_BASED_OUTPATIENT_CLINIC_OR_DEPARTMENT_OTHER): Payer: BLUE CROSS/BLUE SHIELD

## 2014-11-10 ENCOUNTER — Encounter: Payer: Self-pay | Admitting: Hematology

## 2014-11-10 ENCOUNTER — Ambulatory Visit: Payer: BLUE CROSS/BLUE SHIELD | Attending: General Surgery | Admitting: Physical Therapy

## 2014-11-10 ENCOUNTER — Inpatient Hospital Stay: Admission: RE | Admit: 2014-11-10 | Payer: BLUE CROSS/BLUE SHIELD | Source: Ambulatory Visit

## 2014-11-10 ENCOUNTER — Other Ambulatory Visit: Payer: Self-pay | Admitting: General Surgery

## 2014-11-10 ENCOUNTER — Ambulatory Visit (HOSPITAL_BASED_OUTPATIENT_CLINIC_OR_DEPARTMENT_OTHER): Payer: BLUE CROSS/BLUE SHIELD | Admitting: Hematology

## 2014-11-10 ENCOUNTER — Encounter: Payer: Self-pay | Admitting: Physical Therapy

## 2014-11-10 ENCOUNTER — Ambulatory Visit
Admission: RE | Admit: 2014-11-10 | Discharge: 2014-11-10 | Disposition: A | Discharge status: Home / Self Care | Payer: BLUE CROSS/BLUE SHIELD | Source: Ambulatory Visit | Attending: Radiation Oncology | Admitting: Radiation Oncology

## 2014-11-10 VITALS — BP 157/76 | HR 68 | Temp 98.2°F | Resp 18 | Ht 61.0 in | Wt 158.6 lb

## 2014-11-10 DIAGNOSIS — M545 Low back pain: Secondary | ICD-10-CM

## 2014-11-10 DIAGNOSIS — Z170 Estrogen receptor positive status [ER+]: Secondary | ICD-10-CM | POA: Diagnosis not present

## 2014-11-10 DIAGNOSIS — C50412 Malignant neoplasm of upper-outer quadrant of left female breast: Secondary | ICD-10-CM

## 2014-11-10 DIAGNOSIS — G4700 Insomnia, unspecified: Secondary | ICD-10-CM

## 2014-11-10 DIAGNOSIS — F419 Anxiety disorder, unspecified: Secondary | ICD-10-CM

## 2014-11-10 DIAGNOSIS — R293 Abnormal posture: Secondary | ICD-10-CM | POA: Insufficient documentation

## 2014-11-10 DIAGNOSIS — F418 Other specified anxiety disorders: Secondary | ICD-10-CM

## 2014-11-10 DIAGNOSIS — Z808 Family history of malignant neoplasm of other organs or systems: Secondary | ICD-10-CM

## 2014-11-10 LAB — CBC WITH DIFFERENTIAL/PLATELET
BASO%: 0.5 % (ref 0.0–2.0)
BASOS ABS: 0 10*3/uL (ref 0.0–0.1)
EOS%: 1.5 % (ref 0.0–7.0)
Eosinophils Absolute: 0.1 10*3/uL (ref 0.0–0.5)
HCT: 41.4 % (ref 34.8–46.6)
HGB: 14.4 g/dL (ref 11.6–15.9)
LYMPH%: 26.6 % (ref 14.0–49.7)
MCH: 31 pg (ref 25.1–34.0)
MCHC: 34.7 g/dL (ref 31.5–36.0)
MCV: 89.1 fL (ref 79.5–101.0)
MONO#: 0.6 10*3/uL (ref 0.1–0.9)
MONO%: 6.6 % (ref 0.0–14.0)
NEUT#: 5.7 10*3/uL (ref 1.5–6.5)
NEUT%: 64.8 % (ref 38.4–76.8)
Platelets: 313 10*3/uL (ref 145–400)
RBC: 4.65 10*6/uL (ref 3.70–5.45)
RDW: 12.7 % (ref 11.2–14.5)
WBC: 8.7 10*3/uL (ref 3.9–10.3)
lymph#: 2.3 10*3/uL (ref 0.9–3.3)

## 2014-11-10 LAB — COMPREHENSIVE METABOLIC PANEL (CC13)
ALT: 25 U/L (ref 0–55)
ANION GAP: 8 meq/L (ref 3–11)
AST: 18 U/L (ref 5–34)
Albumin: 4 g/dL (ref 3.5–5.0)
Alkaline Phosphatase: 81 U/L (ref 40–150)
BUN: 11.4 mg/dL (ref 7.0–26.0)
CHLORIDE: 104 meq/L (ref 98–109)
CO2: 28 meq/L (ref 22–29)
CREATININE: 0.8 mg/dL (ref 0.6–1.1)
Calcium: 9.5 mg/dL (ref 8.4–10.4)
EGFR: 86 mL/min/{1.73_m2} — ABNORMAL LOW (ref >90)
Glucose: 89 mg/dl (ref 70–140)
POTASSIUM: 4.1 meq/L (ref 3.5–5.1)
Sodium: 141 mEq/L (ref 136–145)
Total Bilirubin: 0.4 mg/dL (ref 0.20–1.20)
Total Protein: 7.1 g/dL (ref 6.4–8.3)

## 2014-11-10 MED ORDER — ALPRAZOLAM 0.25 MG PO TABS: 0.2500 mg | ORAL_TABLET | Freq: Every evening | ORAL | Status: DC | PRN

## 2014-11-10 NOTE — Therapy (Signed)
[facility] [address] [place], [place], [address] Phone: [phone]   Fax:  [phone]  Physical Therapy Evaluation  Patient Details  Name: Shirley Howard MRN: [id number] Date of Birth: 1960-12-26 Referring Provider:  Almond Lint, MD  Encounter Date: 11/10/2014      PT End of Session - 11/10/14 1010    Visit Number 1   Number of Visits 1   PT Start Time 1048   PT Stop Time 1112  Also saw pt from [id number]   PT Time Calculation (min) 24 min   Activity Tolerance Patient tolerated treatment well   Behavior During Therapy [facility] for tasks assessed/performed      Past Medical History  Diagnosis Date  . Wrist pain   . Sore throat 10/27/2012    10/31/12 [facility] ENT (see scanned document) - vocal cord polyp left, bilateral edema, significant postcricoid edema, c/w chronic tobacco use and chronic reflux. - Recheck 2 months - Encouraged tobacco cessation to reduce risk of laryngeal cancer     . Anxiety   . Depression   . Hot flashes     Past Surgical History  Procedure Laterality Date  . Cyst removal hand    . Tubal ligation  1995    There were no vitals filed for this visit.  Visit Diagnosis:  Carcinoma of upper-outer quadrant of left female breast [facility]) - Plan: PT plan of care cert/re-cert  Abnormal posture - Plan: PT plan of care cert/re-cert      Subjective Assessment - 11/10/14 0946    Subjective Patient was seen today for a baseline assessment of her newly diagnosed left breast cancer.   Pertinent History Patient was diagnosed on 10/28/14 with left invasive ductal with DCIS breast cancer which is ER/PR positive and HER2 negative. Her Ki67 is 60% anmd measures 2.1 cm in size in the upper outer quadrant.   Patient Stated Goals Reduce lymphedema risk and learn post op shoulder ROM HEP   Currently in Pain? No/denies  Reports intermittent left shoulder and back pain but none now.            [facility] PT  Assessment - 11/10/14 0001    Assessment   Medical Diagnosis Left breast cancer   Onset Date/Surgical Date 10/28/14   Hand Dominance Right   Prior Therapy none   Precautions   Precautions Other (comment)  Active breast cancer   Restrictions   Weight Bearing Restrictions No   Balance Screen   Has the patient fallen in the past 6 months No   Has the patient had a decrease in activity level because of a fear of falling?  No   Is the patient reluctant to leave their home because of a fear of falling?  No   Home [employer]ist information centre manager residence   Living Arrangements Spouse/significant other   Available Help at Discharge Family   Prior Function   Level of Independence Independent   Vocation Full time employment   Vocation Requirements Truck driver   Cognition   Overall Cognitive Status Within Functional Limits for tasks assessed   Posture/Postural Control   Posture/Postural Control Postural limitations   Postural Limitations Forward head;Rounded Shoulders   ROM / Strength   AROM / PROM / Strength AROM;Strength   AROM   AROM Assessment Site Shoulder   Right/Left Shoulder Right;Left   Right Shoulder Extension 40 Degrees   Right Shoulder Flexion 148 Degrees   Right Shoulder ABduction 155 Degrees  Right Shoulder Internal Rotation 73 Degrees   Right Shoulder External Rotation 73 Degrees   Left Shoulder Extension 55 Degrees   Left Shoulder Flexion 152 Degrees   Left Shoulder ABduction 152 Degrees   Left Shoulder Internal Rotation 60 Degrees   Left Shoulder External Rotation 77 Degrees   Strength   Overall Strength Within functional limits for tasks performed           LYMPHEDEMA/ONCOLOGY QUESTIONNAIRE - 11/10/14 0958    Type   Cancer Type Left breast cancer   Lymphedema Assessments   Lymphedema Assessments Upper extremities   Right Upper Extremity Lymphedema   10 cm Proximal to Olecranon Process 28.4 cm   Olecranon Process 24.5 cm   10 cm Proximal to  Ulnar Styloid Process 21 cm   Just Proximal to Ulnar Styloid Process 15.2 cm   Across Hand at [employer] 18.5 cm   At [place] of 2nd Digit 6.4 cm   Left Upper Extremity Lymphedema   10 cm Proximal to Olecranon Process 30 cm   Olecranon Process 24.5 cm   10 cm Proximal to Ulnar Styloid Process 20.8 cm   Just Proximal to Ulnar Styloid Process 14.7 cm   Across Hand at [employer] 18.1 cm   At [place] of 2nd Digit 6.1 cm      Patient was instructed today in a home exercise program today for post op shoulder range of motion. These included active assist shoulder flexion in sitting, scapular retraction, wall walking with shoulder abduction, and hands behind head external rotation.  She was encouraged to do these twice a day, holding 3 seconds and repeating 5 times when permitted by her physician.           PT Education - 11/10/14 [phone]    Education provided Yes   Education Details Lymphedema risk reduction and post op shoulder ROM HEP   Person(s) Educated Patient;Spouse;Child(ren);Parent(s)   Methods Explanation;Demonstration;Verbal cues;Handout   Comprehension Returned demonstration;Verbalized understanding              Breast Clinic Goals - 11/10/14 1017    Patient will be able to verbalize understanding of pertinent lymphedema risk reduction practices relevant to her diagnosis specifically related to skin care.   Time 1   Period Days   Status Achieved   Patient will be able to return demonstrate and/or verbalize understanding of the post-op home exercise program related to regaining shoulder range of motion.   Time 1   Period Days   Status Achieved   Patient will be able to verbalize understanding of the importance of attending the postoperative After Breast Cancer Class for further lymphedema risk reduction education and therapeutic exercise.   Time 1   Period Days   Status Achieved              Plan - 11/10/14 1010    Clinical Impression Statement Patient  was diagnosed on 10/28/14 with left invasive ductal with DCIS breast cancer which is ER/PR positive and HER2 negative. Her Ki67 is 60% anmd measures 2.1 cm in size in the upper outer quadrant.  She is planning to have a left lumpectomy and sentinel node biopsy followed by Oncotype testing, radiation and anti-estrogen therapy.   Pt will benefit from skilled therapeutic intervention in order to improve on the following deficits Decreased range of motion;Impaired UE functional use;Pain;Decreased knowledge of precautions;Decreased strength   Rehab Potential Excellent   Clinical Impairments Affecting Rehab Potential none   PT Frequency One  time visit   PT Treatment/Interventions Therapeutic exercise;Patient/family education   Consulted and Agree with Plan of Care Patient;Family member/caregiver   Family Member Consulted Husband, mother, daughter     Patient will follow up at outpatient cancer rehab if needed following surgery.  If the patient requires physical therapy at that time, a specific plan will be dictated and sent to the referring physician for approval. The patient was educated today on appropriate basic range of motion exercises to begin post operatively and the importance of attending the After Breast Cancer class following surgery.  Patient was educated today on lymphedema risk reduction practices as it pertains to recommendations that will benefit the patient immediately following surgery.  She verbalized good understanding.  No additional physical therapy is indicated at this time.       Problem List Patient Active Problem List   Diagnosis Date Noted  . Breast cancer of upper-outer quadrant of left female breast ([place]) 11/04/2014  . Left breast mass 10/22/2014  . Pap smear abnormality of cervix with ASCUS favoring benign 03/12/2014  . Vocal cord nodule 03/08/2014  . Preventative health care 12/02/2013  . Weight gain 01/05/2013  . Poor sleep pattern 11/24/2012  . Left wrist pain  04/28/2012  . History of depression 10/15/2011  . HYPERLIPIDEMIA 08/26/2009  . Ex-heavy cigarette smoker (20-39 per day) 09/27/2008  . MENOPAUSAL SYNDROME 09/27/2008    Annia Friendly, PT 11/10/2014 11:37 AM  [place], [place], [address] Phone: [phone]   Fax:  [phone]

## 2014-11-10 NOTE — Progress Notes (Signed)
Ms. Hon is a very pleasant 54 y.o. female from [place], [place] with newly diagnosed grade 2 invasive mammary carcinoma of the left breast.  Biopsy results revealed the tumor's prognostic profile is ER positive, PR positive, and HER2/neu negative. Ki67 is 60%.  She presents today with her husband, mother, and daughter to the [facility]) for treatment consideration and recommendations from the breast surgeon, radiation oncologist, and medical oncologist.     I briefly met with Ms. Winberry and her family during her [facility] visit today. We discussed the purpose of the Survivorship Clinic, which will include monitoring for recurrence, coordinating completion of age and gender-appropriate cancer screenings, promotion of overall wellness, as well as managing potential late/long-term side effects of anti-cancer treatments.    The treatment plan for Ms. Kon will likely include surgery, radiation therapy, and anti-estrogen therapy. As of today, the intent of treatment for Ms. Konigsberg is cure, therefore she will be eligible for the Survivorship Clinic upon her completion of treatment.  Her survivorship care plan (SCP) document will be drafted and updated throughout the course of her treatment trajectory. She will receive the SCP in an office visit with myself in the Survivorship Clinic once she has completed treatment.   Ms. Riddell was encouraged to ask questions and all questions were answered to her satisfaction.  She was given my business card and encouraged to contact me with any concerns regarding survivorship.  I look forward to participating in her care.   Kenn File, [place] [phone]

## 2014-11-10 NOTE — Patient Instructions (Signed)

## 2014-11-10 NOTE — Progress Notes (Signed)
Clinical Social Work [place] Psychosocial Distress Screening [place]  Patient completed distress screening protocol and scored a 9 on the Psychosocial Distress Thermometer which indicates severe distress. Clinical Social Worker met with patient and patient's family in [facility] to assess for distress and other psychosocial needs. Patient stated she was doing "ok" and felt comfortable with her treatment plan and treatment team. CSW and patient discussed common feeling and emotions when being diagnosed with cancer, and the importance of support during treatment. CSW informed patient of the support team and support services at [facility], and patient was agreeable to an Network engineer. CSW provided contact information and encouraged patient to call with any questions or concerns.   ONCBCN DISTRESS SCREENING 11/10/2014  Screening Type Initial Screening  Distress experienced in past week (1-10) 9  Emotional problem type Nervousness/Anxiety;Adjusting to illness;Feeling hopeless  Spiritual/Religous concerns type Facing my mortality  Information Concerns Type Lack of info about diagnosis;Lack of info about treatment  Physician notified of physical symptoms Yes  Referral to clinical psychology No  Referral to clinical social work Yes  Referral to dietition No  Referral to financial advocate No  Referral to support programs Yes  Referral to palliative care No   Johnnye Lana, MSW, LCSW, OSW-C Clinical Social Worker [place] [phone]

## 2014-11-10 NOTE — Progress Notes (Signed)
[facility] NEW PATIENT EVALUATION  Name: Shirley Howard MRN: [id number]  Date:   11/10/2014           DOB: [birth year]  Status: outpatient   CC: Shirley Dawn, MD  Stark Klein, MD    REFERRING PHYSICIAN: Stark Klein, MD   DIAGNOSIS: Clinical stage IIA (T2 N0 M0) invasive ductal carcinoma/DCIS of the left breast   HISTORY OF PRESENT ILLNESS:  Shirley Howard is a 54 y.o. female who is seen today through the courtesy of Dr. Barry Dienes at the breast multidisciplinary clinic for evaluation of her T2 N0 invasive ductal/DCIS of the left breast.  She noted a left breast mass within the upper-outer quadrant.  Mammography on 10/28/2014 at the [facility], showed a 2 cm spiculated mass within the upper-outer quadrant.  Ultrasound showed a 2.1 x 1.3 x 2.1 cm mass at 2:00.  Ultrasound of the axilla was negative.  A biopsy on September 26 showed grade 2 invasive ductal carcinoma with DCIS.  Ecadherin testing was positive.  Her tumor was ER/PR positive, both at 100% with an elevated Ki-67 of 60%, HER-2/neu negative.  She is seen today with Dr. Barry Dienes and Dr. Burr Medico.  She is without complaints today.  PREVIOUS RADIATION THERAPY: No   PAST MEDICAL HISTORY:  has a past medical history of Wrist pain; Sore throat (10/27/2012); Anxiety; Depression; and Hot flashes.     PAST SURGICAL HISTORY:  Past Surgical History  Procedure Laterality Date  . Cyst removal hand    . Tubal ligation  1995     FAMILY HISTORY: family history includes Cancer in her father; Stroke in her father; Supraventricular tachycardia in her mother; Thyroid disease (age of onset: 26) in her mother. her mother is alive and well 70.  Her father died of a stroke at age 70.  No family history of breast cancer.     SOCIAL HISTORY:  reports that she quit smoking about 2 years ago. Her smoking use included Cigarettes. She has a 60 pack-year smoking history. She has never used smokeless tobacco. She reports that  she drinks alcohol. She reports that she does not use illicit drugs.  Married, 2 children.  She works as a Administrator.    ALLERGIES: Codeine   MEDICATIONS:  Current Outpatient Prescriptions  Medication Sig Dispense Refill  . ALPRAZolam (XANAX) 0.25 MG tablet Take 1 tablet (0.25 mg total) by mouth at bedtime as needed for anxiety or sleep. 30 tablet 0  . aspirin 81 MG tablet Take 81 mg by mouth daily.    . calcium carbonate (OS-CAL) 600 MG TABS tablet Take 600 mg by mouth daily with breakfast.    . cholecalciferol (VITAMIN D) 1000 UNITS tablet Take 2,000 Units by mouth daily.    . diphenhydrAMINE (SOMINEX) 25 MG tablet Take 25 mg by mouth at bedtime as needed for sleep.    . fish oil-omega-3 fatty acids 1000 MG capsule Take 2 g by mouth daily.    . fluticasone (FLONASE) 50 MCG/ACT nasal spray Place 2 sprays into both nostrils daily. 16 g 0  . Multiple Vitamin (MULTIVITAMIN) tablet Take 1 tablet by mouth daily.    Marland Kitchen omeprazole (PRILOSEC) 40 MG capsule Take one tablet daily.     No current facility-administered medications for this encounter.     REVIEW OF SYSTEMS:  Pertinent items are noted in HPI.    PHYSICAL EXAM:  Alert and oriented 54 year old white female appearing her stated age.  Head and  neck examination: Grossly unremarkable.  Nodes: Without palpable cervical, supraclavicular, or axillary lymphadenopathy.  Breasts: There is a palpable mass measuring approximately 2 cm within the upper-outer quadrant of the left breast at 2:00.  No other masses are appreciated.  Right breast without masses or lesions.  Extremities: Without edema.      LABORATORY DATA:  Lab Results  Component Value Date   WBC 8.7 11/10/2014   HGB 14.4 11/10/2014   HCT 41.4 11/10/2014   MCV 89.1 11/10/2014   PLT 313 11/10/2014   Lab Results  Component Value Date   NA 141 11/10/2014   K 4.1 11/10/2014   CL 102 11/25/2012   CO2 28 11/10/2014   Lab Results  Component Value Date   ALT 25 11/10/2014    AST 18 11/10/2014   ALKPHOS 81 11/10/2014   BILITOT 0.40 11/10/2014      IMPRESSION: clinical stage IIA (T2 N0 M0) invasive ductal/DCIS of the left breast.  We discussed local regional treatment which includes mastectomy with or without reconstruction or lumpectomy followed by radiation therapy.  She will need a sentinel lymph node biopsy.  We discussed hypofractionated treatment versus standard fractionation.  We discussed the potential acute and late toxicities of radiation therapy.  We discussed the timing of radiation therapy depending on whether or not she will require adjuvant chemotherapy.  She appears to be a candidate for initial surgery.  Dr. Burr Medico will probably order Oncotype DX testing.   PLAN: As discussed above.    I spent 30 minutes minutes face to face with the patient and more than 50% of that time was spent in counseling and/or coordination of care.

## 2014-11-10 NOTE — Progress Notes (Signed)
Subjective:     Patient ID: Shirley Howard, female   DOB: [birth year], 54 y.o.   MRN: [id number]  HPI   Review of Systems     Objective:   Physical Exam For the patient to understand and be given the tools to implement a healthy plant based diet during their cancer diagnosis.     Assessment:     Patient was seen today and found to be in good spirits and accompanied by her family. Pts wt 158 pounds, ht 5'1'', and BMI 30. Pts medications: vitamin d, calcium, fish oil, and multivitamin. Labs WNL. Pts family had great questions and pt states she was on a diet and lost 17 pounds. Pt states she is not currently on a diet. Pt states she has been trying to stay active because the dx has hit her hard but her family is very supportive.      Plan:     Dietitian educated the patient on implementing a plant based diet by incorporating more plant proteins, fruits, and vegetables. As a part of a healthy routine physical activity was discussed. Dietitian educated the pt on supplementation and the USP label.  The importance of legitimate, evidence based information was discussed and examples were given. A folder of evidence based information with a focus on a plant based diet and general nutrition during cancer was given to the patient.  As a part of the continuum of care the cancer dietitian's contact information was given to the patient in the event they would like to have a follow up appointment.

## 2014-11-11 ENCOUNTER — Encounter (HOSPITAL_BASED_OUTPATIENT_CLINIC_OR_DEPARTMENT_OTHER): Payer: Self-pay | Admitting: *Deleted

## 2014-11-15 ENCOUNTER — Telehealth: Payer: Self-pay | Admitting: Hematology

## 2014-11-15 ENCOUNTER — Telehealth: Payer: Self-pay | Admitting: *Deleted

## 2014-11-15 ENCOUNTER — Other Ambulatory Visit: Payer: Self-pay | Admitting: *Deleted

## 2014-11-15 NOTE — Telephone Encounter (Signed)
Called patient with new follow up appointment

## 2014-11-15 NOTE — Telephone Encounter (Signed)
Spoke with patient from [facility].  She is doing well and will have surgery tomorrow.  Informed her she will be getting a call for appointments with Dr. Burr Medico and Dr. Valere Dross for follow up after surgery.  Encouraged her to call with any needs or concerns.

## 2014-11-16 ENCOUNTER — Encounter (HOSPITAL_BASED_OUTPATIENT_CLINIC_OR_DEPARTMENT_OTHER): Payer: Self-pay

## 2014-11-16 ENCOUNTER — Ambulatory Visit (HOSPITAL_BASED_OUTPATIENT_CLINIC_OR_DEPARTMENT_OTHER): Payer: BLUE CROSS/BLUE SHIELD | Admitting: Anesthesiology

## 2014-11-16 ENCOUNTER — Ambulatory Visit (HOSPITAL_BASED_OUTPATIENT_CLINIC_OR_DEPARTMENT_OTHER)
Admission: RE | Admit: 2014-11-16 | Discharge: 2014-11-16 | Disposition: A | Discharge status: Home / Self Care | Payer: BLUE CROSS/BLUE SHIELD | Source: Ambulatory Visit | Attending: General Surgery | Admitting: General Surgery

## 2014-11-16 ENCOUNTER — Ambulatory Visit (HOSPITAL_COMMUNITY)
Admission: RE | Admit: 2014-11-16 | Discharge: 2014-11-16 | Disposition: A | Discharge status: Home / Self Care | Payer: BLUE CROSS/BLUE SHIELD | Source: Ambulatory Visit | Attending: General Surgery | Admitting: General Surgery

## 2014-11-16 ENCOUNTER — Encounter (HOSPITAL_BASED_OUTPATIENT_CLINIC_OR_DEPARTMENT_OTHER)
Admission: RE | Disposition: A | Discharge status: Home / Self Care | Payer: Self-pay | Source: Ambulatory Visit | Attending: General Surgery

## 2014-11-16 DIAGNOSIS — Z170 Estrogen receptor positive status [ER+]: Secondary | ICD-10-CM | POA: Diagnosis not present

## 2014-11-16 DIAGNOSIS — Z7982 Long term (current) use of aspirin: Secondary | ICD-10-CM | POA: Diagnosis not present

## 2014-11-16 DIAGNOSIS — Z87891 Personal history of nicotine dependence: Secondary | ICD-10-CM | POA: Diagnosis not present

## 2014-11-16 DIAGNOSIS — Z79899 Other long term (current) drug therapy: Secondary | ICD-10-CM | POA: Insufficient documentation

## 2014-11-16 DIAGNOSIS — C50412 Malignant neoplasm of upper-outer quadrant of left female breast: Secondary | ICD-10-CM

## 2014-11-16 DIAGNOSIS — K219 Gastro-esophageal reflux disease without esophagitis: Secondary | ICD-10-CM | POA: Insufficient documentation

## 2014-11-16 HISTORY — PX: BREAST LUMPECTOMY: SHX2

## 2014-11-16 HISTORY — PX: BREAST LUMPECTOMY WITH AXILLARY LYMPH NODE BIOPSY: SHX5593

## 2014-11-16 HISTORY — DX: Gastro-esophageal reflux disease without esophagitis: K21.9

## 2014-11-16 SURGERY — BREAST LUMPECTOMY WITH AXILLARY LYMPH NODE BIOPSY
Anesthesia: Regional | Site: Breast | Laterality: Left

## 2014-11-16 MED ORDER — PROPOFOL 10 MG/ML IV BOLUS
INTRAVENOUS | Status: AC
  Filled 2014-11-16: qty 20

## 2014-11-16 MED ORDER — SODIUM CHLORIDE 0.9 % IV SOLN: 250.0000 mL | INTRAVENOUS | Status: DC | PRN

## 2014-11-16 MED ORDER — MIDAZOLAM HCL 2 MG/2ML IJ SOLN
INTRAMUSCULAR | Status: AC
  Filled 2014-11-16: qty 2

## 2014-11-16 MED ORDER — ONDANSETRON HCL 4 MG/2ML IJ SOLN
INTRAMUSCULAR | Status: AC
  Filled 2014-11-16: qty 2

## 2014-11-16 MED ORDER — BUPIVACAINE-EPINEPHRINE (PF) 0.5% -1:200000 IJ SOLN
INTRAMUSCULAR | Status: AC
  Filled 2014-11-16: qty 30

## 2014-11-16 MED ORDER — FENTANYL CITRATE (PF) 100 MCG/2ML IJ SOLN
INTRAMUSCULAR | Status: AC
Start: 2014-11-16 — End: 2014-11-16
  Filled 2014-11-16: qty 4

## 2014-11-16 MED ORDER — CEFAZOLIN SODIUM-DEXTROSE 2-3 GM-% IV SOLR
INTRAVENOUS | Status: AC
  Filled 2014-11-16: qty 50

## 2014-11-16 MED ORDER — GLYCOPYRROLATE 0.2 MG/ML IJ SOLN: 0.2000 mg | Freq: Once | INTRAMUSCULAR | Status: DC | PRN

## 2014-11-16 MED ORDER — LIDOCAINE HCL (CARDIAC) 20 MG/ML IV SOLN
INTRAVENOUS | Status: AC
  Filled 2014-11-16: qty 5

## 2014-11-16 MED ORDER — LACTATED RINGERS IV SOLN
INTRAVENOUS | Status: DC
  Administered 2014-11-16 (×3): via INTRAVENOUS

## 2014-11-16 MED ORDER — ACETAMINOPHEN 325 MG PO TABS: 650.0000 mg | ORAL_TABLET | ORAL | Status: DC | PRN

## 2014-11-16 MED ORDER — METHYLENE BLUE 1 % INJ SOLN
INTRAMUSCULAR | Status: AC
  Filled 2014-11-16: qty 10

## 2014-11-16 MED ORDER — MIDAZOLAM HCL 2 MG/2ML IJ SOLN
INTRAMUSCULAR | Status: AC
  Filled 2014-11-16: qty 4

## 2014-11-16 MED ORDER — FENTANYL CITRATE (PF) 100 MCG/2ML IJ SOLN
INTRAMUSCULAR | Status: AC
  Filled 2014-11-16: qty 2

## 2014-11-16 MED ORDER — SODIUM CHLORIDE 0.9 % IJ SOLN: 3.0000 mL | Freq: Two times a day (BID) | INTRAMUSCULAR | Status: DC

## 2014-11-16 MED ORDER — OXYCODONE HCL 5 MG PO TABS: 5.0000 mg | ORAL_TABLET | ORAL | Status: DC | PRN

## 2014-11-16 MED ORDER — ACETAMINOPHEN 650 MG RE SUPP: 650.0000 mg | RECTAL | Status: DC | PRN

## 2014-11-16 MED ORDER — OXYCODONE-ACETAMINOPHEN 5-325 MG PO TABS: 1.0000 | ORAL_TABLET | ORAL | Status: DC | PRN

## 2014-11-16 MED ORDER — FENTANYL CITRATE (PF) 100 MCG/2ML IJ SOLN
50.0000 ug | INTRAMUSCULAR | Status: AC | PRN
  Administered 2014-11-16: 100 ug via INTRAVENOUS
  Administered 2014-11-16 (×2): 50 ug via INTRAVENOUS

## 2014-11-16 MED ORDER — CEFAZOLIN SODIUM-DEXTROSE 2-3 GM-% IV SOLR
2.0000 g | INTRAVENOUS | Status: AC
  Administered 2014-11-16: 2 g via INTRAVENOUS

## 2014-11-16 MED ORDER — ONDANSETRON HCL 4 MG/2ML IJ SOLN
INTRAMUSCULAR | Status: DC | PRN
  Administered 2014-11-16: 4 mg via INTRAVENOUS

## 2014-11-16 MED ORDER — TECHNETIUM TC 99M SULFUR COLLOID FILTERED
1.0000 | Freq: Once | INTRAVENOUS | Status: AC | PRN
  Administered 2014-11-16: 1 via INTRADERMAL

## 2014-11-16 MED ORDER — LIDOCAINE HCL (CARDIAC) 20 MG/ML IV SOLN
INTRAVENOUS | Status: DC | PRN
  Administered 2014-11-16: 50 mg via INTRAVENOUS

## 2014-11-16 MED ORDER — EPHEDRINE SULFATE 50 MG/ML IJ SOLN
INTRAMUSCULAR | Status: DC | PRN
  Administered 2014-11-16: 10 mg via INTRAVENOUS

## 2014-11-16 MED ORDER — SCOPOLAMINE 1 MG/3DAYS TD PT72: 1.0000 | MEDICATED_PATCH | Freq: Once | TRANSDERMAL | Status: DC | PRN

## 2014-11-16 MED ORDER — OXYCODONE HCL 5 MG/5ML PO SOLN: 5.0000 mg | Freq: Once | ORAL | Status: DC | PRN

## 2014-11-16 MED ORDER — PROMETHAZINE HCL 25 MG/ML IJ SOLN
INTRAMUSCULAR | Status: AC
  Filled 2014-11-16: qty 1

## 2014-11-16 MED ORDER — HYDROMORPHONE HCL 1 MG/ML IJ SOLN
0.2500 mg | INTRAMUSCULAR | Status: DC | PRN
  Administered 2014-11-16 (×2): 0.5 mg via INTRAVENOUS

## 2014-11-16 MED ORDER — BUPIVACAINE-EPINEPHRINE (PF) 0.5% -1:200000 IJ SOLN
INTRAMUSCULAR | Status: AC
Start: 2014-11-16 — End: 2014-11-16
  Filled 2014-11-16: qty 30

## 2014-11-16 MED ORDER — PROPOFOL 10 MG/ML IV BOLUS
INTRAVENOUS | Status: DC | PRN
  Administered 2014-11-16: 150 mg via INTRAVENOUS

## 2014-11-16 MED ORDER — BUPIVACAINE-EPINEPHRINE 0.5% -1:200000 IJ SOLN
INTRAMUSCULAR | Status: DC | PRN
  Administered 2014-11-16: 20 mL

## 2014-11-16 MED ORDER — SODIUM CHLORIDE 0.9 % IJ SOLN
INTRAMUSCULAR | Status: AC
  Filled 2014-11-16: qty 10

## 2014-11-16 MED ORDER — DEXAMETHASONE SODIUM PHOSPHATE 10 MG/ML IJ SOLN
INTRAMUSCULAR | Status: AC
  Filled 2014-11-16: qty 1

## 2014-11-16 MED ORDER — SODIUM CHLORIDE 0.9 % IJ SOLN: 3.0000 mL | INTRAMUSCULAR | Status: DC | PRN

## 2014-11-16 MED ORDER — PROMETHAZINE HCL 25 MG/ML IJ SOLN
6.2500 mg | INTRAMUSCULAR | Status: DC | PRN
  Administered 2014-11-16: 6.25 mg via INTRAVENOUS

## 2014-11-16 MED ORDER — DEXAMETHASONE SODIUM PHOSPHATE 4 MG/ML IJ SOLN
INTRAMUSCULAR | Status: DC | PRN
  Administered 2014-11-16: 10 mg via INTRAVENOUS

## 2014-11-16 MED ORDER — MEPERIDINE HCL 25 MG/ML IJ SOLN: 6.2500 mg | INTRAMUSCULAR | Status: DC | PRN

## 2014-11-16 MED ORDER — HYDROMORPHONE HCL 1 MG/ML IJ SOLN
INTRAMUSCULAR | Status: AC
  Filled 2014-11-16: qty 1

## 2014-11-16 MED ORDER — MIDAZOLAM HCL 2 MG/2ML IJ SOLN
1.0000 mg | INTRAMUSCULAR | Status: DC | PRN
  Administered 2014-11-16: 2 mg via INTRAVENOUS
  Administered 2014-11-16 (×2): 1 mg via INTRAVENOUS

## 2014-11-16 MED ORDER — OXYCODONE HCL 5 MG PO TABS: 5.0000 mg | ORAL_TABLET | Freq: Once | ORAL | Status: DC | PRN

## 2014-11-16 SURGICAL SUPPLY — 69 items
BINDER BREAST LRG (GAUZE/BANDAGES/DRESSINGS) ×3 IMPLANT
BINDER BREAST MEDIUM (GAUZE/BANDAGES/DRESSINGS) IMPLANT
BINDER BREAST XLRG (GAUZE/BANDAGES/DRESSINGS) IMPLANT
BINDER BREAST XXLRG (GAUZE/BANDAGES/DRESSINGS) IMPLANT
BLADE HEX COATED 2.75 (ELECTRODE) ×6 IMPLANT
BLADE SURG 10 STRL SS (BLADE) ×3 IMPLANT
BLADE SURG 15 STRL LF DISP TIS (BLADE) ×1 IMPLANT
BLADE SURG 15 STRL SS (BLADE) ×2
BNDG GAUZE ELAST 4 BULKY (GAUZE/BANDAGES/DRESSINGS) ×3 IMPLANT
CANISTER SUCT 1200ML W/VALVE (MISCELLANEOUS) ×3 IMPLANT
CHLORAPREP W/TINT 26ML (MISCELLANEOUS) ×3 IMPLANT
CLIP TI LARGE 6 (CLIP) ×3 IMPLANT
CLIP TI MEDIUM 6 (CLIP) ×9 IMPLANT
CLIP TI WIDE RED SMALL 6 (CLIP) IMPLANT
CLOSURE WOUND 1/2 X4 (GAUZE/BANDAGES/DRESSINGS) ×1
COVER MAYO STAND STRL (DRAPES) ×3 IMPLANT
COVER PROBE W GEL 5X96 (DRAPES) ×3 IMPLANT
DECANTER SPIKE VIAL GLASS SM (MISCELLANEOUS) IMPLANT
DEVICE DUBIN W/COMP PLATE 8390 (MISCELLANEOUS) ×3 IMPLANT
DRAIN CHANNEL 19F RND (DRAIN) IMPLANT
DRAPE UTILITY XL STRL (DRAPES) ×3 IMPLANT
DRSG PAD ABDOMINAL 8X10 ST (GAUZE/BANDAGES/DRESSINGS) ×3 IMPLANT
DRSG TEGADERM 4X4.75 (GAUZE/BANDAGES/DRESSINGS) IMPLANT
ELECT REM PT RETURN 9FT ADLT (ELECTROSURGICAL) ×3
ELECTRODE REM PT RTRN 9FT ADLT (ELECTROSURGICAL) ×1 IMPLANT
EVACUATOR SILICONE 100CC (DRAIN) IMPLANT
GAUZE SPONGE 4X4 12PLY STRL (GAUZE/BANDAGES/DRESSINGS) IMPLANT
GLOVE BIO SURGEON STRL SZ 6 (GLOVE) ×3 IMPLANT
GLOVE BIO SURGEON STRL SZ 6.5 (GLOVE) ×2 IMPLANT
GLOVE BIO SURGEONS STRL SZ 6.5 (GLOVE) ×1
GLOVE BIOGEL PI IND STRL 6.5 (GLOVE) ×1 IMPLANT
GLOVE BIOGEL PI IND STRL 7.0 (GLOVE) ×1 IMPLANT
GLOVE BIOGEL PI INDICATOR 6.5 (GLOVE) ×2
GLOVE BIOGEL PI INDICATOR 7.0 (GLOVE) ×2
GLOVE EXAM NITRILE EXT CUFF MD (GLOVE) ×3 IMPLANT
GOWN STRL REUS W/ TWL LRG LVL3 (GOWN DISPOSABLE) ×1 IMPLANT
GOWN STRL REUS W/TWL 2XL LVL3 (GOWN DISPOSABLE) ×3 IMPLANT
GOWN STRL REUS W/TWL LRG LVL3 (GOWN DISPOSABLE) ×2
KIT MARKER MARGIN INK (KITS) ×3 IMPLANT
LIGHT WAVEGUIDE WIDE FLAT (MISCELLANEOUS) ×3 IMPLANT
LIQUID BAND (GAUZE/BANDAGES/DRESSINGS) ×3 IMPLANT
NDL SAFETY ECLIPSE 18X1.5 (NEEDLE) IMPLANT
NEEDLE HYPO 18GX1.5 SHARP (NEEDLE)
NEEDLE HYPO 25X1 1.5 SAFETY (NEEDLE) ×3 IMPLANT
NS IRRIG 1000ML POUR BTL (IV SOLUTION) ×3 IMPLANT
PACK BASIN DAY SURGERY FS (CUSTOM PROCEDURE TRAY) ×3 IMPLANT
PACK UNIVERSAL I (CUSTOM PROCEDURE TRAY) ×3 IMPLANT
PENCIL BUTTON HOLSTER BLD 10FT (ELECTRODE) ×6 IMPLANT
PIN SAFETY STERILE (MISCELLANEOUS) IMPLANT
SLEEVE SCD COMPRESS KNEE MED (MISCELLANEOUS) ×3 IMPLANT
SPONGE GAUZE 4X4 12PLY STER LF (GAUZE/BANDAGES/DRESSINGS) ×6 IMPLANT
SPONGE LAP 18X18 X RAY DECT (DISPOSABLE) ×3 IMPLANT
SPONGE LAP 4X18 X RAY DECT (DISPOSABLE) IMPLANT
STAPLER VISISTAT 35W (STAPLE) IMPLANT
STOCKINETTE IMPERVIOUS LG (DRAPES) ×3 IMPLANT
STRIP CLOSURE SKIN 1/2X4 (GAUZE/BANDAGES/DRESSINGS) ×2 IMPLANT
SUT MON AB 4-0 PC3 18 (SUTURE) ×3 IMPLANT
SUT SILK 2 0 SH (SUTURE) IMPLANT
SUT VIC AB 2-0 SH 27 (SUTURE) ×2
SUT VIC AB 2-0 SH 27XBRD (SUTURE) ×1 IMPLANT
SUT VIC AB 3-0 SH 27 (SUTURE) ×4
SUT VIC AB 3-0 SH 27X BRD (SUTURE) ×2 IMPLANT
SYR BULB 3OZ (MISCELLANEOUS) IMPLANT
SYR CONTROL 10ML LL (SYRINGE) ×3 IMPLANT
TOWEL OR 17X24 6PK STRL BLUE (TOWEL DISPOSABLE) ×3 IMPLANT
TOWEL OR NON WOVEN STRL DISP B (DISPOSABLE) IMPLANT
TUBE CONNECTING 20'X1/4 (TUBING) ×1
TUBE CONNECTING 20X1/4 (TUBING) ×2 IMPLANT
YANKAUER SUCT BULB TIP NO VENT (SUCTIONS) ×3 IMPLANT

## 2014-11-16 NOTE — Discharge Instructions (Addendum)
[facility] °Office Phone Number [phone] ° °BREAST BIOPSY/ PARTIAL MASTECTOMY: POST OP INSTRUCTIONS ° °Always review your discharge instruction sheet given to you by the facility where your surgery was performed. ° °IF YOU HAVE DISABILITY OR FAMILY LEAVE FORMS, YOU MUST BRING THEM TO THE OFFICE FOR PROCESSING.  DO NOT GIVE THEM TO YOUR DOCTOR. ° °1. A prescription for pain medication may be given to you upon discharge.  Take your pain medication as prescribed, if needed.  If narcotic pain medicine is not needed, then you may take acetaminophen (Tylenol) or ibuprofen (Advil) as needed. °2. Take your usually prescribed medications unless otherwise directed °3. If you need a refill on your pain medication, please contact your pharmacy.  They will contact our office to request authorization.  Prescriptions will not be filled after 5pm or on week-ends. °4. You should eat very light the first 24 hours after surgery, such as soup, crackers, pudding, etc.  Resume your normal diet the day after surgery. °5. Most patients will experience some swelling and bruising in the breast.  Ice packs and a good support bra will help.  Swelling and bruising can take several days to resolve.  °6. It is common to experience some constipation if taking pain medication after surgery.  Increasing fluid intake and taking a stool softener will usually help or prevent this problem from occurring.  A mild laxative (Milk of Magnesia or Miralax) should be taken according to package directions if there are no bowel movements after 48 hours. °7. Unless discharge instructions indicate otherwise, you may remove your bandages 48 hours after surgery, and you may shower at that time.  You may have steri-strips (small skin tapes) in place directly over the incision.  These strips should be left on the skin for 7-10 days.   Any sutures or staples will be removed at the office during your follow-up visit. °8. ACTIVITIES:  You may resume  regular daily activities (gradually increasing) beginning the next day.  Wearing a good support bra or sports bra (or the breast binder) minimizes pain and swelling.  You may have sexual intercourse when it is comfortable. °a. You may drive when you no longer are taking prescription pain medication, you can comfortably wear a seatbelt, and you can safely maneuver your car and apply brakes. °b. RETURN TO WORK:  __________1 week_______________ °9. You should see your doctor in the office for a follow-up appointment approximately two weeks after your surgery.  Your doctor’s nurse will typically make your follow-up appointment when she calls you with your pathology report.  Expect your pathology report 2-3 business days after your surgery.  You may call to check if you do not hear from us after three days. ° ° °WHEN TO CALL YOUR DOCTOR: °1. Fever over 101.0 °2. Nausea and/or vomiting. °3. Extreme swelling or bruising. °4. Continued bleeding from incision. °5. Increased pain, redness, or drainage from the incision. ° °The clinic staff is available to answer your questions during regular business hours.  Please don’t hesitate to call and ask to speak to one of the nurses for clinical concerns.  If you have a medical emergency, go to the nearest emergency room or call 911.  A surgeon from [facility] is always on call at the hospital. ° °For further questions, please visit [email or web address]  ° ° °Post Anesthesia Home Care Instructions ° °Activity: °Get plenty of rest for the remainder of the day. A responsible adult should stay with you for 24   hours following the procedure.  °For the next 24 hours, DO NOT: °-Drive a car °-Operate machinery °-Drink alcoholic beverages °-Take any medication unless instructed by your physician °-Make any legal decisions or sign important papers. ° °Meals: °Start with liquid foods such as gelatin or soup. Progress to regular foods as tolerated. Avoid greasy, spicy, heavy  foods. If nausea and/or vomiting occur, drink only clear liquids until the nausea and/or vomiting subsides. Call your physician if vomiting continues. ° °Special Instructions/Symptoms: °Your throat may feel dry or sore from the anesthesia or the breathing tube placed in your throat during surgery. If this causes discomfort, gargle with warm salt water. The discomfort should disappear within 24 hours. ° °If you had a scopolamine patch placed behind your ear for the management of post- operative nausea and/or vomiting: ° °1. The medication in the patch is effective for 72 hours, after which it should be removed.  Wrap patch in a tissue and discard in the trash. Wash hands thoroughly with soap and water. °2. You may remove the patch earlier than 72 hours if you experience unpleasant side effects which may include dry mouth, dizziness or visual disturbances. °3. Avoid touching the patch. Wash your hands with soap and water after contact with the patch. °  ° °

## 2014-11-16 NOTE — Op Note (Signed)
Left Breast Lumpectomy with Sentinel Node Mapping and Biopsy Procedure Note  Indications: This patient presents with history of left breast cancer with clinically negative axillary lymph node exam.  Pre-operative Diagnosis: left breast cancer, upper outer quadrant, cT2N0  Post-operative Diagnosis: left breast cancer  Surgeon: Stark Klein   Assistants: none  Anesthesia: General LMA anesthesia and Local anesthesia 0.5% bupivacaine, with epinephrine  ASA Class: 2  Procedure Details  The patient was seen in the Holding Room. The risks, benefits, complications, treatment options, and expected outcomes were discussed with the patient. The possibilities of reaction to medication, pulmonary aspiration, bleeding, infection, the need for additional procedures, failure to diagnose a condition, and creating a complication requiring transfusion or operation were discussed with the patient. The patient concurred with the proposed plan, giving informed consent.  The site of surgery properly noted/marked. The patient was taken to Operating Room # 8, identified as Shirley Howard and the procedure verified as Breast Lumpectomy and Sentinel Node Biopsy. A Time Out was held and the above information confirmed.  After induction of anesthesia, the left arm, breast, and chest were prepped and draped in standard fashion.  The lumpectomy was performed by creating an oblique incision over the lateral left breast.  Dissection was carried down to the pectoral fascia.  Anterior margin is skin. The specimen mammogram confirmed biopsy clip in lesion. Hemostasis was achieved with cautery.  The wound was irrigated and closed with a 3-0 Vicryl deep dermal interrupted and a 4-0 Monocryl subcuticular closure in layers.  Using a hand-held gamma probe, axillary sentinel nodes were identified transcutaneously.  An oblique incision was created below the axillary hairline.  Dissection was carried through the clavipectoral fascia.  3  level 2 axillary sentinel nodes were removed and submitted to pathology. The axillary incision was closed with 3-0 vicryl deep dermal interrupted sutures and 4-0 monocryl subcuticular closure in layers.      Sterile dressings were applied. At the end of the operation, all sponge, instrument, and needle counts were correct.  Findings: grossly clear surgical margins, SLN cps 590, 200, and 95.  Background cps 23  Estimated Blood Loss:  Minimal         Drains: none         Specimens: left lumpectomy, SLN 1, 2, and 3.                Complications:  None; patient tolerated the procedure well.         Disposition: PACU - hemodynamically stable.         Condition: stable

## 2014-11-16 NOTE — Anesthesia Postprocedure Evaluation (Signed)
  Anesthesia Post-op Note  Patient: Camron Broad  Procedure(s) Performed: Procedure(s): LEFT BREAST LUMPECTOMY WITH AXILLARY LYMPH NODE BIOPSY (Left)  Patient Location: PACU  Anesthesia Type: General, Regional   Level of Consciousness: awake, alert  and oriented  Airway and Oxygen Therapy: Patient Spontanous Breathing  Post-op Pain: mild  Post-op Assessment: Post-op Vital signs reviewed  Post-op Vital Signs: Reviewed  Last Vitals:  Filed Vitals:   11/16/14 1700  BP: 154/77  Pulse: 84  Temp: 36.5 C  Resp: 18    Complications: No apparent anesthesia complications

## 2014-11-16 NOTE — Transfer of Care (Signed)
Immediate Anesthesia Transfer of Care Note  Patient: Shirley Howard  Procedure(s) Performed: Procedure(s): LEFT BREAST LUMPECTOMY WITH AXILLARY LYMPH NODE BIOPSY (Left)  Patient Location: PACU  Anesthesia Type:GA combined with regional for post-op pain  Level of Consciousness: awake and alert   Airway & Oxygen Therapy: Patient Spontanous Breathing and Patient connected to face mask oxygen  Post-op Assessment: Report given to RN and Post -op Vital signs reviewed and stable  Post vital signs: Reviewed and stable  Last Vitals:  Filed Vitals:   11/16/14 1250  BP:   Pulse: 67  Temp:   Resp: 13    Complications: No apparent anesthesia complications

## 2014-11-16 NOTE — Interval H&P Note (Signed)
History and Physical Interval Note:  11/16/2014 11:57 AM  Shirley Howard  has presented today for surgery, with the diagnosis of LEFT BREAST CANCER  The various methods of treatment have been discussed with the patient and family. After consideration of risks, benefits and other options for treatment, the patient has consented to  Procedure(s): BREAST LUMPECTOMY WITH AXILLARY LYMPH NODE BIOPSY (Left) as a surgical intervention .  The patient's history has been reviewed, patient examined, no change in status, stable for surgery.  I have reviewed the patient's chart and labs.  Questions were answered to the patient's satisfaction.     Jacobi Nile

## 2014-11-16 NOTE — Anesthesia Preprocedure Evaluation (Signed)
Anesthesia Evaluation  Patient identified by MRN, date of birth, ID band Patient awake    Reviewed: Allergy & Precautions, NPO status , Patient's Chart, lab work & pertinent test results  Airway Mallampati: I  TM Distance: >3 FB Neck ROM: Full    Dental  (+) Teeth Intact, Dental Advisory Given   Pulmonary former smoker,    breath sounds clear to auscultation       Cardiovascular  Rhythm:Regular Rate:Normal     Neuro/Psych    GI/Hepatic GERD  Medicated and Controlled,  Endo/Other    Renal/GU      Musculoskeletal   Abdominal   Peds  Hematology   Anesthesia Other Findings   Reproductive/Obstetrics                             Anesthesia Physical Anesthesia Plan  ASA: II  Anesthesia Plan: General and Regional   Post-op Pain Management:    Induction: Intravenous  Airway Management Planned: LMA  Additional Equipment:   Intra-op Plan:   Post-operative Plan: Extubation in OR  Informed Consent: I have reviewed the patients History and Physical, chart, labs and discussed the procedure including the risks, benefits and alternatives for the proposed anesthesia with the patient or authorized representative who has indicated his/her understanding and acceptance.   Dental advisory given  Plan Discussed with: CRNA, Anesthesiologist and Surgeon  Anesthesia Plan Comments:         Anesthesia Quick Evaluation

## 2014-11-16 NOTE — Anesthesia Procedure Notes (Signed)
Procedure Name: LMA Insertion Date/Time: 11/16/2014 1:25 PM Performed by: Melynda Ripple D Pre-anesthesia Checklist: Patient identified, Emergency Drugs available, Suction available and Patient being monitored Patient Re-evaluated:Patient Re-evaluated prior to inductionOxygen Delivery Method: Circle System Utilized Preoxygenation: Pre-oxygenation with 100% oxygen Intubation Type: IV induction Ventilation: Mask ventilation without difficulty LMA: LMA inserted LMA Size: 4.0 Laryngoscope Size: 4 Number of attempts: 1 Airway Equipment and Method: Bite block Placement Confirmation: positive ETCO2 Tube secured with: Tape Dental Injury: Teeth and Oropharynx as per pre-operative assessment

## 2014-11-16 NOTE — H&P (Signed)
Shirley Howard 11/10/2014 7:59 AM Location: [facility] Patient #: [id number] DOB: 11-Mar-1960 Undefined / Language: Cleophus Molt / Race: White Female  History of Present Illness Stark Klein MD; 11/10/2014 12:11 PM) The patient is a 54 year old female who presents with breast cancer. Patient is a 54 year old female referred for consultation by Dr. Ree Kida regarding a new diagnosis of left breast cancer. The patient presented with a palpable mass in her left breast for approximately 2 weeks. Diagnostic imaging revealed a 2.1 x 1.3 x 2.1 cm mass in the upper outer quadrant of the left breast. A core needle biopsy was performed which demonstrates grade 2 invasive mammary carcinoma with DCIS. This is hormone receptor positive and HER-2 negative. The E-cadherin staining is consistent with ductal phenotype. The patient has no personal history of cancer. Her father had throat cancer. 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 The patient has 2 children. She is a former smoker who quit 2 years ago. She drinks less than 1 serving of alcohol per week. The patient had menarche at age 68. She is postmenopausal with her last period being 6 years ago. She is a G2 P2. She used hormonal contraception for 18 years. She did use hormone replacement for a short period of time. She is up-to-date with colonoscopy, with the first one being less than 5 years ago. She also is up-to-date with her Pap smear. dx u/s/mammo IMPRESSION: 1. Irregular mass within the left breast at the 2 o'clock axis, 2 cm from the nipple, measuring 2.1 x 1.3 x 2.1 cm, corresponding to the palpable abnormality. This is a highly suspicious finding for which ultrasound-guided biopsy is recommended. pathology Diagnosis Breast, left, needle core biopsy, 2:00 o'clock - INVASIVE MAMMARY CARCINOMA, SEE COMMENT. - MAMMARY CARCINOMA IN SITU. CBC, CMET normal   Other Problems Conni Slipper, RN; 11/10/2014 8:00 AM) Arthritis Back Pain Breast Cancer Gastroesophageal Reflux Disease Hemorrhoids Kidney Stone Lump In Breast Migraine Headache  Past Surgical History Conni Slipper, RN; 11/10/2014 8:00 AM) Breast Biopsy Left. Colon Polyp Removal - Colonoscopy Oral Surgery  Diagnostic Studies History Conni Slipper, RN; 11/10/2014 8:00 AM) Colonoscopy 1-5 years ago Mammogram within last year Pap Smear 1-5 years ago  Medication History Conni Slipper, RN; 11/10/2014 8:00 AM) No Current Medications Medications Reconciled  Social History Conni Slipper, RN; 11/10/2014 8:00 AM) Alcohol use Occasional alcohol use. No caffeine use No drug use Tobacco use Former smoker.  Family History Conni Slipper, RN; 11/10/2014 8:00 AM) Cancer Father. Cerebrovascular Accident Father. Depression Daughter. Diabetes Mellitus Mother. Heart Disease Mother. Thyroid problems Mother.  Pregnancy / Birth History Conni Slipper, RN; 11/10/2014 8:00 AM) Age at menarche 20 years. Age of menopause 26-50 Contraceptive History Oral contraceptives. Gravida 2 Irregular periods Maternal age 76-20 Para 2  Review of Systems Conni Slipper RN; 11/10/2014 8:00 AM) General Present- Fatigue and Night Sweats. Not Present- Appetite Loss, Chills, Fever, Weight Gain and Weight Loss. Skin Present- Change in Wart/Mole. Not Present- Dryness, Hives, Jaundice, New Lesions, Non-Healing Wounds, Rash and Ulcer. HEENT Present- Hoarseness, Sore Throat and Wears glasses/contact lenses. Not Present- Earache, Hearing Loss, Nose Bleed, Oral Ulcers, Ringing in the Ears, Seasonal Allergies, Sinus Pain, Visual Disturbances and Yellow Eyes. Respiratory Present- Snoring. Not Present- Bloody sputum, Chronic Cough, Difficulty Breathing and Wheezing. Breast Present- Breast Mass. Not Present- Breast Pain, Nipple Discharge and Skin Changes. Cardiovascular Not Present- Chest Pain, Difficulty Breathing Lying Down,  Leg Cramps, Palpitations, Rapid Heart Rate,  Shortness of Breath and Swelling of Extremities. Gastrointestinal Present- Indigestion. Not Present- Abdominal Pain, Bloating, Bloody Stool, Change in Bowel Habits, Chronic diarrhea, Constipation, Difficulty Swallowing, Excessive gas, Gets full quickly at meals, Hemorrhoids, Nausea, Rectal Pain and Vomiting. Female Genitourinary Not Present- Frequency, Nocturia, Painful Urination, Pelvic Pain and Urgency. Musculoskeletal Present- Back Pain. Not Present- Joint Pain, Joint Stiffness, Muscle Pain, Muscle Weakness and Swelling of Extremities. Neurological Present- Numbness. Not Present- Decreased Memory, Fainting, Headaches, Seizures, Tingling, Tremor, Trouble walking and Weakness. Psychiatric Present- Anxiety, Change in Sleep Pattern and Fearful. Not Present- Bipolar, Depression and Frequent crying. Endocrine Present- Hair Changes and Hot flashes. Not Present- Cold Intolerance, Excessive Hunger, Heat Intolerance and New Diabetes. Hematology Not Present- Easy Bruising, Excessive bleeding, Gland problems, HIV and Persistent Infections.   Physical Exam Stark Klein MD; 11/10/2014 12:13 PM) General Mental Status-Alert. General Appearance-Consistent with stated age. Hydration-Well hydrated. Voice-Normal.  Head and Neck Head-normocephalic, atraumatic with no lesions or palpable masses. Trachea-midline. Thyroid Gland Characteristics - normal size and consistency.  Eye Eyeball - Bilateral-Extraocular movements intact. Sclera/Conjunctiva - Bilateral-No scleral icterus.  Chest and Lung Exam Chest and lung exam reveals -quiet, even and easy respiratory effort with no use of accessory muscles and on auscultation, normal breath sounds, no adventitious sounds and normal vocal resonance. Inspection Chest Wall - Normal. Back - normal.  Breast Note: Palpable mass on the left breast at 2:30. This is mobile and 2.5 cm . There is no nipple  retraction on that side. She has no lymphadenopathy palpable. There is no nipple discharge on either side. she has no skin dimpling. There is very faint bruising at the site of her biopsy.   Cardiovascular Cardiovascular examination reveals -normal heart sounds, regular rate and rhythm with no murmurs and normal pedal pulses bilaterally.  Abdomen Inspection Inspection of the abdomen reveals - No Hernias. Palpation/Percussion Palpation and Percussion of the abdomen reveal - Soft, Non Tender, No Rebound tenderness, No Rigidity (guarding) and No hepatosplenomegaly. Auscultation Auscultation of the abdomen reveals - Bowel sounds normal.  Neurologic Neurologic evaluation reveals -alert and oriented x 3 with no impairment of recent or remote memory. Mental Status-Normal.  Musculoskeletal Global Assessment -Note: no gross deformities.  Normal Exam - Left-Upper Extremity Strength Normal and Lower Extremity Strength Normal. Normal Exam - Right-Upper Extremity Strength Normal and Lower Extremity Strength Normal.  Lymphatic Head & Neck  General Head & Neck Lymphatics: Bilateral - Description - Normal. Axillary  General Axillary Region: Bilateral - Description - Normal. Tenderness - Non Tender. Femoral & Inguinal  Generalized Femoral & Inguinal Lymphatics: Bilateral - Description - No Generalized lymphadenopathy.    Assessment & Plan Stark Klein MD; 11/10/2014 12:15 PM) PRIMARY CANCER OF UPPER OUTER QUADRANT OF LEFT FEMALE BREAST (C50.412) Impression: Patient has a new diagnosis of cT2N0 left breast cancer. She is a good candidate for lumpectomy. We do not need a seed for this patient as the mass is easily palpable. She will also need a sentinel lymph node biopsy. The final pathology will be followed by an Oncotype to help make recommendations for additional treatment. She would then need adjuvant radiotherapy.  She would like this done as soon as possible. I discussed the  time off work as being between 1 and 2 weeks. The surgical procedure was described to the patient. I discussed the incision type and location and that we would need radiology involved on with a wire or seed marker and/or sentinel node.  The risks and benefits of the procedure were described  to the patient and she wishes to proceed.  We discussed the risks bleeding, infection, damage to other structures, need for further procedures/surgeries. We discussed the risk of seroma. The patient was advised if the area in the breast in cancer, we may need to go back to surgery for additional tissue to obtain negative margins or for a lymph node biopsy. The patient was advised that these are the most common complications, but that others can occur as well. They were advised against taking aspirin or other anti-inflammatory agents/blood thinners the week before surgery.  60 min spent in evaluation, examination, counseling, coordination of care. >50% spent in counseling. Current Plans  You are being scheduled for surgery - Our schedulers will call you.  You should hear from our office's scheduling department within 5 working days about the location, date, and time of surgery. We try to make accommodations for patient's preferences in scheduling surgery, but sometimes the OR schedule or the surgeon's schedule prevents [place] from making those accommodations.  If you have not heard from our office [phone]) in 5 working days, call the office and ask for your surgeon's nurse.  If you have other questions about your diagnosis, plan, or surgery, call the office and ask for your surgeon's nurse. Pt Education - [facility] breast cancer surgery: discussed with patient and provided information. Pt Education - [facility] Breast Cancer Information Given - [facility] "Breast Journey" Package   Signed by Stark Klein, MD (11/10/2014 12:16 PM)

## 2014-11-16 NOTE — Progress Notes (Signed)
Assisted Dr. Crews with left, ultrasound guided, pectoralis block. Side rails up, monitors on throughout procedure. See vital signs in flow sheet. Tolerated Procedure well. 

## 2014-11-17 ENCOUNTER — Encounter (HOSPITAL_BASED_OUTPATIENT_CLINIC_OR_DEPARTMENT_OTHER): Payer: Self-pay | Admitting: General Surgery

## 2014-11-19 ENCOUNTER — Encounter: Payer: Self-pay | Admitting: Gastroenterology

## 2014-11-22 ENCOUNTER — Encounter: Payer: Self-pay | Admitting: *Deleted

## 2014-11-22 NOTE — Progress Notes (Unsigned)
Ordered oncotype per Dr. Feng.  Faxed PA to [employer] and faxed requisition to pathology and confirmed receipt. 

## 2014-11-26 ENCOUNTER — Encounter (HOSPITAL_COMMUNITY): Payer: Self-pay

## 2014-11-27 NOTE — Progress Notes (Signed)
Location of Breast Cancer:Left Breast, Upper Outer Quadrant  Histology per Pathology Report:  10/31/24 Diagnosis Breast, left, needle core biopsy, 2:00 o'clock - INVASIVE MAMMARY CARCINOMA, SEE COMMENT.- MAMMARY CARCINOMA IN SITU.  10/11/6 Diagnosis 1. Breast, lumpectomy, Left - INVASIVE DUCTAL CARCINOMA, GRADE 3, SPANNING 2 CM.- DUCTAL CARCINOMA IN SITU, HIGH GRADE.- RESECTION MARGINS ARE NEGATIVE FOR INVASIVE AND IN SITU CARCINOMA. - LOBULAR NEOPLASIA (ATYPICAL LOBULAR HYPERPLASIA).- SEE ONCOLOGY TABLE. 2. Lymph node, sentinel, biopsy, Left axillary #1 - ONE OF ONE LYMPH NODES NEGATIVE FOR CARCINOMA (0/1).1 of 4Duplicate copy FINAL for Bartz, Jacole ([id number]) Diagnosis(continued) 3. Lymph node, sentinel, biopsy, Left axillary #2 - MICROMETASTASIS IN ONE OF ONE LYMPH NODE (1/1). 4. Lymph node, sentinel, biopsy, Left axillary #3 - ONE OF ONE LYMPH NODES NEGATIVE FOR CARCINOMA (0/1).  Receptor Status: ER(100%), PR (100%), Her2-neu (Neg), Ki-67(60%)  Shirley Howard  felt a lump in her left breast in September, which was mild tender. A screening Mammogram revealed a 2.1 cm mass in the left breast 2:00 position, core needle biopsy showed invasive ductal carcinoma, and DCIS.  Past/Anticipated interventions by surgeon, if NPV:[id number] Left Breast Dr. Ralene Muskrat  Past/Anticipated interventions by medical oncology, if any: adjuvant endocrine therapy   Lymphedema issues, if any: No  Pain issues, if any: No    SAFETY ISSUES:  Prior radiation? NO  Pacemaker/ICD? NO  Possible current pregnancy?No  Is the patient on methotrexate? No  Current Complaints / other details:  Menarche age 24,G2P2,oral contraceptives,HRT 30 years stopped 9/216  T2 Lesion  Allergies:Codeine- Cheston, Crista Curb, RN 11/27/2014,1:43 PM

## 2014-11-29 ENCOUNTER — Encounter: Payer: Self-pay | Admitting: *Deleted

## 2014-11-29 ENCOUNTER — Other Ambulatory Visit: Payer: Self-pay | Admitting: General Surgery

## 2014-11-29 NOTE — Progress Notes (Signed)
Received oncotype score of 30. Care team notified of results.

## 2014-11-30 ENCOUNTER — Ambulatory Visit
Admission: RE | Admit: 2014-11-30 | Discharge: 2014-11-30 | Disposition: A | Discharge status: Home / Self Care | Payer: BLUE CROSS/BLUE SHIELD | Source: Ambulatory Visit | Attending: Radiation Oncology | Admitting: Radiation Oncology

## 2014-11-30 ENCOUNTER — Encounter: Payer: Self-pay | Admitting: Radiation Oncology

## 2014-11-30 VITALS — BP 119/63 | HR 62 | Resp 16 | Ht 61.0 in | Wt 159.9 lb

## 2014-11-30 DIAGNOSIS — Z170 Estrogen receptor positive status [ER+]: Secondary | ICD-10-CM | POA: Insufficient documentation

## 2014-11-30 DIAGNOSIS — C50412 Malignant neoplasm of upper-outer quadrant of left female breast: Secondary | ICD-10-CM

## 2014-11-30 DIAGNOSIS — C50912 Malignant neoplasm of unspecified site of left female breast: Secondary | ICD-10-CM | POA: Insufficient documentation

## 2014-11-30 NOTE — Progress Notes (Signed)
See progress note under physician encounter. 

## 2014-11-30 NOTE — Progress Notes (Signed)
CC: Dr. Stark Howard, Dr. Truitt Howard  Diagnosis: Stage I B (T1 N41mi M0) invasive ductal carcinoma of the left breast  History: [name] is a most pleasant 54 year old female who is seen today for review and discussion of radiation therapy in the management of her T1 N79mi invasive ductal carcinoma of left breast.  I first saw the patient at the breast multidisciplinary clinic on 11/10/2014. She noted a left breast mass within the upper-outer quadrant. Mammography on 10/28/2014 at the [facility], showed a 2 cm spiculated mass within the upper-outer quadrant. Ultrasound showed a 2.1 x 1.3 x 2.1 cm mass at 2:00. Ultrasound of the axilla was negative. A biopsy on September 26 showed grade 2 invasive ductal carcinoma with DCIS. Ecadherin testing was positive. Her tumor was ER/PR positive, both at 100% with an elevated Ki-67 of 60%, HER-2/neu negative. On 11/16/2014 she underwent a left partial mastectomy and sentinel lymph node biopsy.  She was found to have a 2.0 cm invasive ductal carcinoma along with high-grade DCIS.  Resection margins were negative, 0.2 cm for invasive disease, and 0.4 cm for in situ disease.  One of 3 lymph nodes contained a micrometastasis.  Her Oncotype DX score is 30, and she will see Dr. Burr Howard next week.  She plans to offer adjuvant chemotherapy.  Physical examination: Alert and oriented.  Filed Vitals:   11/30/14 0814  BP: 119/63  Pulse: 62  Resp: 16   Head and neck examination: Grossly unremarkable.  Nodes: There is no palpable cervical, supraclavicular, or axillary lymphadenopathy.  Her left axillary sentinel lymph node biopsy wound is healing well.  Breasts: There is a partial mastectomy wound along the upper-outer quadrant of the left breast at approximately 2:00.  The wound is healing well with skin glue.  There is the usual degree of underlying induration.  Extremities: Without edema.  Impression: Stage I B (T1 N31mi M0) invasive ductal carcinoma of the left  breast  We again discussed local regional management, I feel that she would be a candidate for high tangents.  We discussed the potential acute and late toxicities of radiation therapy.  We discussed deep inspiration breath-hold technology to avoid cardiac irradiation.  There is controversy regarding the local regional management for patients with micrometastatic nodal disease with most experts treating such patients as having N0 disease.  I would consider high tangents in this patient.  I feel that she would still be a candidate for hypofractionated treatment.  The question at hand is the potential benefit of chemotherapy, she will meet with Dr. Burr Howard early next week.  In the event that she declines chemotherapy she will need to return here for CT simulation later next week.  Otherwise, Dr. Burr Howard conservative follow-up visit with [place] next year when she nears completion of chemotherapy.  Plan: As above.  30 minutes was spent face-to-face with the patient, primarily counseling patient and coordinating her care.

## 2014-12-06 ENCOUNTER — Ambulatory Visit (HOSPITAL_BASED_OUTPATIENT_CLINIC_OR_DEPARTMENT_OTHER): Payer: BLUE CROSS/BLUE SHIELD | Admitting: Hematology

## 2014-12-06 ENCOUNTER — Telehealth: Payer: Self-pay | Admitting: Hematology

## 2014-12-06 ENCOUNTER — Encounter: Payer: Self-pay | Admitting: Hematology

## 2014-12-06 ENCOUNTER — Telehealth: Payer: Self-pay | Admitting: *Deleted

## 2014-12-06 VITALS — BP 119/75 | HR 72 | Temp 98.5°F | Resp 18 | Ht 61.0 in | Wt 157.7 lb

## 2014-12-06 DIAGNOSIS — M545 Low back pain: Secondary | ICD-10-CM | POA: Diagnosis not present

## 2014-12-06 DIAGNOSIS — G7000 Myasthenia gravis without (acute) exacerbation: Secondary | ICD-10-CM

## 2014-12-06 DIAGNOSIS — R079 Chest pain, unspecified: Secondary | ICD-10-CM

## 2014-12-06 DIAGNOSIS — F418 Other specified anxiety disorders: Secondary | ICD-10-CM

## 2014-12-06 DIAGNOSIS — C50412 Malignant neoplasm of upper-outer quadrant of left female breast: Secondary | ICD-10-CM | POA: Diagnosis not present

## 2014-12-06 MED ORDER — ALPRAZOLAM 0.25 MG PO TABS: 0.2500 mg | ORAL_TABLET | Freq: Every evening | ORAL | Status: DC | PRN

## 2014-12-06 NOTE — Patient Instructions (Signed)
Cyclophosphamide injection What is this medicine? CYCLOPHOSPHAMIDE (sye kloe FOSS fa mide) is a chemotherapy drug. It slows the growth of cancer cells. This medicine is used to treat many types of cancer like lymphoma, myeloma, leukemia, breast cancer, and ovarian cancer, to name a few. This medicine may be used for other purposes; ask your health care provider or pharmacist if you have questions. What should I tell my health care provider before I take this medicine? They need to know if you have any of these conditions: -blood disorders -history of other chemotherapy -infection -kidney disease -liver disease -recent or ongoing radiation therapy -tumors in the bone marrow -an unusual or allergic reaction to cyclophosphamide, other chemotherapy, other medicines, foods, dyes, or preservatives -pregnant or trying to get pregnant -breast-feeding How should I use this medicine? This drug is usually given as an injection into a vein or muscle or by infusion into a vein. It is administered in a hospital or clinic by a specially trained health care professional. Talk to your pediatrician regarding the use of this medicine in children. Special care may be needed. Overdosage: If you think you have taken too much of this medicine contact a poison control center or emergency room at once. NOTE: This medicine is only for you. Do not share this medicine with others. What if I miss a dose? It is important not to miss your dose. Call your doctor or health care professional if you are unable to keep an appointment. What may interact with this medicine? This medicine may interact with the following medications: -amiodarone -amphotericin B -azathioprine -certain antiviral medicines for HIV or AIDS such as protease inhibitors (e.g., indinavir, ritonavir) and zidovudine -certain blood pressure medications such as benazepril, captopril, enalapril, fosinopril, lisinopril, moexipril, monopril, perindopril,  quinapril, ramipril, trandolapril -certain cancer medications such as anthracyclines (e.g., daunorubicin, doxorubicin), busulfan, cytarabine, paclitaxel, pentostatin, tamoxifen, trastuzumab -certain diuretics such as chlorothiazide, chlorthalidone, hydrochlorothiazide, indapamide, metolazone -certain medicines that treat or prevent blood clots like warfarin -certain muscle relaxants such as succinylcholine -cyclosporine -etanercept -indomethacin -medicines to increase blood counts like filgrastim, pegfilgrastim, sargramostim -medicines used as general anesthesia -metronidazole -natalizumab This list may not describe all possible interactions. Give your health care provider a list of all the medicines, herbs, non-prescription drugs, or dietary supplements you use. Also tell them if you smoke, drink alcohol, or use illegal drugs. Some items may interact with your medicine. What should I watch for while using this medicine? Visit your doctor for checks on your progress. This drug may make you feel generally unwell. This is not uncommon, as chemotherapy can affect healthy cells as well as cancer cells. Report any side effects. Continue your course of treatment even though you feel ill unless your doctor tells you to stop. Drink water or other fluids as directed. Urinate often, even at night. In some cases, you may be given additional medicines to help with side effects. Follow all directions for their use. Call your doctor or health care professional for advice if you get a fever, chills or sore throat, or other symptoms of a cold or flu. Do not treat yourself. This drug decreases your body's ability to fight infections. Try to avoid being around people who are sick. This medicine may increase your risk to bruise or bleed. Call your doctor or health care professional if you notice any unusual bleeding. Be careful brushing and flossing your teeth or using a toothpick because you may get an infection or  bleed more easily. If you have  any dental work done, tell your dentist you are receiving this medicine. You may get drowsy or dizzy. Do not drive, use machinery, or do anything that needs mental alertness until you know how this medicine affects you. Do not become pregnant while taking this medicine or for 1 year after stopping it. Women should inform their doctor if they wish to become pregnant or think they might be pregnant. Men should not father a child while taking this medicine and for 4 months after stopping it. There is a potential for serious side effects to an unborn child. Talk to your health care professional or pharmacist for more information. Do not breast-feed an infant while taking this medicine. This medicine may interfere with the ability to have a child. This medicine has caused ovarian failure in some women. This medicine has caused reduced sperm counts in some men. You should talk with your doctor or health care professional if you are concerned about your fertility. If you are going to have surgery, tell your doctor or health care professional that you have taken this medicine. What side effects may I notice from receiving this medicine? Side effects that you should report to your doctor or health care professional as soon as possible: -allergic reactions like skin rash, itching or hives, swelling of the face, lips, or tongue -low blood counts - this medicine may decrease the number of white blood cells, red blood cells and platelets. You may be at increased risk for infections and bleeding. -signs of infection - fever or chills, cough, sore throat, pain or difficulty passing urine -signs of decreased platelets or bleeding - bruising, pinpoint red spots on the skin, black, tarry stools, blood in the urine -signs of decreased red blood cells - unusually weak or tired, fainting spells, lightheadedness -breathing problems -dark urine -dizziness -palpitations -swelling of the  ankles, feet, hands -trouble passing urine or change in the amount of urine -weight gain -yellowing of the eyes or skin Side effects that usually do not require medical attention (report to your doctor or health care professional if they continue or are bothersome): -changes in nail or skin color -hair loss -missed menstrual periods -mouth sores -nausea, vomiting This list may not describe all possible side effects. Call your doctor for medical advice about side effects. You may report side effects to FDA at 1-800-FDA-1088. Where should I keep my medicine? This drug is given in a hospital or clinic and will not be stored at home. NOTE: This sheet is a summary. It may not cover all possible information. If you have questions about this medicine, talk to your doctor, pharmacist, or health care provider.    2016, [employer]. (2011-12-07 16:22:58) Docetaxel injection What is this medicine? DOCETAXEL (doe se TAX el) is a chemotherapy drug. It targets fast dividing cells, like cancer cells, and causes these cells to die. This medicine is used to treat many types of cancers like breast cancer, certain stomach cancers, head and neck cancer, lung cancer, and prostate cancer. This medicine may be used for other purposes; ask your health care provider or pharmacist if you have questions. What should I tell my health care provider before I take this medicine? They need to know if you have any of these conditions: -infection (especially a virus infection such as chickenpox, cold sores, or herpes) -liver disease -low blood counts, like low white cell, platelet, or red cell counts -an unusual or allergic reaction to docetaxel, polysorbate 80, other chemotherapy agents, other medicines, foods,  dyes, or preservatives -pregnant or trying to get pregnant -breast-feeding How should I use this medicine? This drug is given as an infusion into a vein. It is administered in a hospital or clinic by  a specially trained health care professional. Talk to your pediatrician regarding the use of this medicine in children. Special care may be needed. Overdosage: If you think you have taken too much of this medicine contact a poison control center or emergency room at once. NOTE: This medicine is only for you. Do not share this medicine with others. What if I miss a dose? It is important not to miss your dose. Call your doctor or health care professional if you are unable to keep an appointment. What may interact with this medicine? -cyclosporine -erythromycin -ketoconazole -medicines to increase blood counts like filgrastim, pegfilgrastim, sargramostim -vaccines Talk to your doctor or health care professional before taking any of these medicines: -acetaminophen -aspirin -ibuprofen -ketoprofen -naproxen This list may not describe all possible interactions. Give your health care provider a list of all the medicines, herbs, non-prescription drugs, or dietary supplements you use. Also tell them if you smoke, drink alcohol, or use illegal drugs. Some items may interact with your medicine. What should I watch for while using this medicine? Your condition will be monitored carefully while you are receiving this medicine. You will need important blood work done while you are taking this medicine. This drug may make you feel generally unwell. This is not uncommon, as chemotherapy can affect healthy cells as well as cancer cells. Report any side effects. Continue your course of treatment even though you feel ill unless your doctor tells you to stop. In some cases, you may be given additional medicines to help with side effects. Follow all directions for their use. Call your doctor or health care professional for advice if you get a fever, chills or sore throat, or other symptoms of a cold or flu. Do not treat yourself. This drug decreases your body's ability to fight infections. Try to avoid being around  people who are sick. This medicine may increase your risk to bruise or bleed. Call your doctor or health care professional if you notice any unusual bleeding. This medicine may contain alcohol in the product. You may get drowsy or dizzy. Do not drive, use machinery, or do anything that needs mental alertness until you know how this medicine affects you. Do not stand or sit up quickly, especially if you are an older patient. This reduces the risk of dizzy or fainting spells. Avoid alcoholic drinks. Do not become pregnant while taking this medicine. Women should inform their doctor if they wish to become pregnant or think they might be pregnant. There is a potential for serious side effects to an unborn child. Talk to your health care professional or pharmacist for more information. Do not breast-feed an infant while taking this medicine. What side effects may I notice from receiving this medicine? Side effects that you should report to your doctor or health care professional as soon as possible: -allergic reactions like skin rash, itching or hives, swelling of the face, lips, or tongue -low blood counts - This drug may decrease the number of white blood cells, red blood cells and platelets. You may be at increased risk for infections and bleeding. -signs of infection - fever or chills, cough, sore throat, pain or difficulty passing urine -signs of decreased platelets or bleeding - bruising, pinpoint red spots on the skin, black, tarry stools, nosebleeds -signs  of decreased red blood cells - unusually weak or tired, fainting spells, lightheadedness -breathing problems -fast or irregular heartbeat -low blood pressure -mouth sores -nausea and vomiting -pain, swelling, redness or irritation at the injection site -pain, tingling, numbness in the hands or feet -swelling of the ankle, feet, hands -weight gain Side effects that usually do not require medical attention (report to your prescriber or health  care professional if they continue or are bothersome): -bone pain -complete hair loss including hair on your head, underarms, pubic hair, eyebrows, and eyelashes -diarrhea -excessive tearing -changes in the color of fingernails -loosening of the fingernails -nausea -muscle pain -red flush to skin -sweating -weak or tired This list may not describe all possible side effects. Call your doctor for medical advice about side effects. You may report side effects to FDA at 1-800-FDA-1088. Where should I keep my medicine? This drug is given in a hospital or clinic and will not be stored at home. NOTE: This sheet is a summary. It may not cover all possible information. If you have questions about this medicine, talk to your doctor, pharmacist, or health care provider.    2016, [employer]. (2014-02-08 16:04:57)

## 2014-12-06 NOTE — Progress Notes (Addendum)
Shirley Howard  [phone] [phone]  Clinic follow up Note   Patient Care Team: Lupita Dawn, MD as PCP - General (Family Medicine) Stark Klein, MD as Consulting Physician (General Surgery) Truitt Merle, MD as Consulting Physician (Hematology) Arloa Koh, MD as Consulting Physician (Radiation Oncology) Rockwell Germany, RN as Registered Nurse Mauro Kaufmann, RN as Registered Nurse Sylvan Cheese, NP as Nurse Practitioner (Nurse Practitioner) 12/06/2014  CHIEF COMPLAINTS:  Follow up left breast cancer  Oncology History   Breast cancer of upper-outer quadrant of left female breast [facility])   Staging form: Breast, AJCC 7th Edition     Clinical stage from 11/10/2014: Stage IIA (T2, N0, M0) - Signed by Truitt Merle, MD on 11/10/2014     Pathologic stage from 11/18/2014: Stage IB (T1c, N27mi, cM0) - Signed by Enid Cutter, MD on 12/01/2014       Staging comments: Staged on lumpectomy specimen by Dr. Tresa Moore          Breast cancer of upper-outer quadrant of left female breast ([place])   10/28/2014 Mammogram Screening and the diagnostic mammogram shows a 2.1 x 1.3 x 2.1 cm it irregular mass in the left breast 2:00 position, 2 cm from the nipple.   11/01/2014 Receptors her2 ER 100%, PR100%+, HER2-, KI67 60%    11/01/2014 Initial Biopsy left breast biopsy showed invasive ductal carcinoma, grade 2, and DCIS    11/04/2014 Initial Diagnosis Breast cancer of upper-outer quadrant of left female breast ([place])   11/16/2014 Oncotype testing RS 30, predicts 20% 10-y risk of distant recurrence with tamoxifen alone    11/16/2014 Surgery left breast lumpectomy iwth SLN biopsy.    11/16/2014 Pathology Results left breast invasive ductal carcinoma, G3, 1 out of 3 SLN had micrometastasis, (-)LVI, margines negative,  (+) high grade DCIS    HISTORY OF PRESENTING ILLNESS:  Shirley Howard 54 y.o. female is here because of her recently diagnosed left breast cancer. She is  accompanied by her husband, daughter, mother to our multidisciplinary breast clinic today.  She felt a lump in her left breast one  Month ago, mild tender, she otherwise feels well no other new symptoms. This was developed by her primary care physician and screening mammogram was obtained. Mammogram reviewed a 2.1 cm mass in the left breast 2:00 position, core needle biopsy showed invasive ductal carcinoma, and DCIS.  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 She feels well overall. She does have mild low back pain, does not take any education. She is physically active at home, but does not exercise regularly. No family history of breast cancer. She was on hormone replacement for the past 8 years, and just stopped progesterone a few weeks ago.  INTERIM HISTORY  [name] returns for follow up. She underwent left breast lumpectomy with sentinel lymph node biopsy on 11/16/2014. She tolerated surgery very well, has occasional mild residual pain at the incision site, otherwise has recovered completely. She reports intermittent substernal chest pain lately, nonexertional, dull pain, which lasts for several hours. It happens most during the day, not related to meals. She does have acid reflux, and is on omeprazole 40 mg daily at night.  MEDICAL HISTORY:  Past Medical History  Diagnosis Date  . Wrist pain   . Sore throat 10/27/2012    10/31/12 [facility] ENT (see scanned document) - vocal cord polyp left, bilateral edema, significant postcricoid edema, c/w chronic tobacco use and chronic reflux. - Recheck 2  months - Encouraged tobacco cessation to reduce risk of laryngeal cancer     . Anxiety   . Depression   . Hot flashes   . GERD (gastroesophageal reflux disease)     SURGICAL HISTORY: Past Surgical History  Procedure Laterality Date  . Cyst removal hand    . Tubal ligation  1995  . Breast lumpectomy with axillary lymph node biopsy Left 11/16/2014    Procedure: LEFT BREAST  LUMPECTOMY WITH AXILLARY LYMPH NODE BIOPSY;  Surgeon: Stark Klein, MD;  Location: [place];  Service: General;  Laterality: Left;   GYN HISTORY  Menarchal: 10 LMP: 48 Contraceptive: 16 HRT: 8 years, stopped in 10/2014  G2P2:    SOCIAL HISTORY: Social History   Social History  . Marital Status: Married    Spouse Name: N/A  . Number of Children: N/A  . Years of Education: N/A   Occupational History  . Not on file.   Social History Main Topics  . Smoking status: Former Smoker -- 1.50 packs/day for 40 years    Types: Cigarettes    Quit date: 11/04/2012  . Smokeless tobacco: Never Used  . Alcohol Use: Yes     Comment: social users   . Drug Use: No  . Sexual Activity:    Partners: Male     Comment: Husband   Other Topics Concern  . Not on file   Social History Narrative   Lives with husband in [place].   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 Supraventricular tachycardia Mother   . Thyroid disease Mother 53    unsure what type   . Cancer Father     throat cancer - smoker  . Stroke Father     ALLERGIES:  is allergic to codeine.  MEDICATIONS:  Current Outpatient Prescriptions  Medication Sig Dispense Refill  . ALPRAZolam (XANAX) 0.25 MG tablet Take 1 tablet (0.25 mg total) by mouth at bedtime as needed for anxiety or sleep. 30 tablet 0  . aspirin 81 MG tablet Take 81 mg by mouth daily.    . calcium carbonate (OS-CAL) 600 MG TABS tablet Take 600 mg by mouth daily with breakfast.    . cholecalciferol (VITAMIN D) 1000 UNITS tablet Take 2,000 Units by mouth daily.    . diphenhydrAMINE (SOMINEX) 25 MG tablet Take 50 mg by mouth at bedtime as needed for sleep.     . fish oil-omega-3 fatty acids 1000 MG capsule Take 2 g by mouth daily.    . fluticasone (FLONASE) 50 MCG/ACT  nasal spray Place 2 sprays into both nostrils daily. 16 g 0  . Inulin (PHILLIPS FIBER GOOD PO) Take by mouth.    . Multiple Vitamin (MULTIVITAMIN) tablet Take 1 tablet by mouth daily.    Marland Kitchen omeprazole (PRILOSEC) 40 MG capsule Take one tablet daily.    Marland Kitchen oxyCODONE-acetaminophen (PERCOCET/ROXICET) 5-325 MG tablet   0   No current facility-administered medications for this visit.    REVIEW OF SYSTEMS:   Constitutional: Denies fevers, chills or abnormal night sweats Eyes: Denies blurriness of vision, double vision or watery eyes Ears, nose, mouth, throat, and face: Denies mucositis or sore throat Respiratory: Denies cough, dyspnea or wheezes Cardiovascular: Denies palpitation, chest discomfort or lower extremity swelling Gastrointestinal:  Denies nausea, heartburn or change in bowel habits Skin: Denies abnormal skin rashes Lymphatics: Denies new lymphadenopathy or easy bruising Neurological:Denies numbness, tingling or new weaknesses Behavioral/Psych: Mood is stable, no new changes  All other systems were reviewed with the patient and are negative.  PHYSICAL EXAMINATION: ECOG PERFORMANCE STATUS: 0 - Asymptomatic  Filed Vitals:   12/06/14 0903  BP: 119/75  Pulse: 72  Temp: 98.5 F (36.9 C)  Resp: 18   Filed Weights   12/06/14 0903  Weight: 157 lb 11.2 oz (71.532 kg)    GENERAL:alert, no distress and comfortable SKIN: skin color, texture, turgor are normal, no rashes or significant lesions EYES: normal, conjunctiva are pink and non-injected, sclera clear OROPHARYNX:no exudate, no erythema and lips, buccal mucosa, and tongue normal  NECK: supple, thyroid normal size, non-tender, without nodularity LYMPH:  no palpable lymphadenopathy in the cervical, axillary or inguinal LUNGS: clear to auscultation and percussion with normal breathing effort HEART: regular rate & rhythm and no murmurs and no lower extremity edema ABDOMEN:abdomen soft, non-tender and normal bowel  sounds Musculoskeletal:no cyanosis of digits and no clubbing  PSYCH: alert & oriented x 3 with fluent speech NEURO: no focal motor/sensory deficits Breasts: Breast inspection showed them to be symmetrical with no nipple discharge. The left breast incision is healing well, clean, no surrounding skin erythema. There is fullness underneath the surgical incision, likely surgical scar or seroma. Palpation of the right breast and bilateral axilla with showed no palpable mass or adenopathy.  LABORATORY DATA:  I have reviewed the data as listed Lab Results  Component Value Date   WBC 8.7 11/10/2014   HGB 14.4 11/10/2014   HCT 41.4 11/10/2014   MCV 89.1 11/10/2014   PLT 313 11/10/2014    Recent Labs  11/10/14 0832  NA 141  K 4.1  CO2 28  GLUCOSE 89  BUN 11.4  CREATININE 0.8  CALCIUM 9.5  PROT 7.1  ALBUMIN 4.0  AST 18  ALT 25  ALKPHOS 81  BILITOT 0.40    Pathology report Diagnosis 1. Breast, lumpectomy, Left - INVASIVE DUCTAL CARCINOMA, GRADE 3, SPANNING 2 CM. - DUCTAL CARCINOMA IN SITU, HIGH GRADE. - RESECTION MARGINS ARE NEGATIVE FOR INVASIVE AND IN SITU CARCINOMA. - LOBULAR NEOPLASIA (ATYPICAL LOBULAR HYPERPLASIA). - SEE ONCOLOGY TABLE. 2. Lymph node, sentinel, biopsy, Left axillary #1 - ONE OF ONE LYMPH NODES NEGATIVE FOR CARCINOMA (0/1). 3. Lymph node, sentinel, biopsy, Left axillary #2 - MICROMETASTASIS IN ONE OF ONE LYMPH NODE (1/1). 4. Lymph node, sentinel, biopsy, Left axillary #3 - ONE OF ONE LYMPH NODES NEGATIVE FOR CARCINOMA (0/1).  Microscopic Comment 1. BREAST, INVASIVE TUMOR, WITH LYMPH NODES PRESENT Specimen, including laterality and lymph node sampling (sentinel, non-sentinel): Left breast lump and left axillary sentinel lymph nodes. Procedure: Left breast lumpectomy and left axillary sentinel lymph node biopsies. Histologic type: Invasive ductal carcinoma. Grade: 3 Tubule formation: 3 Nuclear pleomorphism: 3 Mitotic: 2 Tumor size (gross  measurement): 2 cm Margins: Invasive, distance to closest margin: 0.2 cm (inferior). In-situ, distance to closest margin: 0.4 cm (superior). If margin positive, focally or broadly: N/A. Lymphovascular invasion: Definitive invasion not identified. Ductal carcinoma in situ: Present. Grade: III Extensive intraductal component: No. Lobular neoplasia: Present (atypical lobular hyperplasia). Tumor focality: Unifocal. Treatment effect: N/A. Extent of tumor: Confined to breast parenchyma. Lymph nodes: Examined: 3 Sentinel 0 Non-sentinel 3 Total Lymph nodes with metastasis: 1 Isolated tumor cells (< 0.2 mm): 0 Micrometastasis: (> 0.2 mm and < 2.0 mm): 1 Macrometastasis: (> 2.0  mm): 0 Extracapsular extension: No. Breast prognostic profile: Performed on biopsy ([id number]). Her2 will be repeated on current specimen. Estrogen receptor: Positive, strong staining intensity. Progesterone receptor: Positive, strong staining intensity. Her 2 neu: Negative. Ki-67: 60%. Non-neoplastic breast: Fibrocystic change. TNM: pT1c, pN20mi  Results: HER2 - NEGATIVE RATIO OF HER2/CEP17 SIGNALS 1.15 AVERAGE HER2 COPY NUMBER PER CELL 3.15  Oncotype Dx RS: 30, predicts 20% 10-y risk of distant recurrence with tamoxifen alone   RADIOGRAPHIC STUDIES: I have personally reviewed the radiological images as listed and agreed with the findings in the report.  No new scans  ASSESSMENT & PLAN:  54 year old Caucasian female, postmenopausal, presented with palpable left breast mass  1. Left breast invasive ductal carcinoma, [id number], stage IB, grade 3, ER positive, PR positive, HER-2 negative, (+) DCIS -I reviewed her surgical pathology results in great details with patient and her husband. -She is now status post lumpectomy and sentinel lymph node biopsy. Surgical margins were negative. She has early-stage breast cancer, likely acute by complete surgical resection -I discussed the risk of cancer recurrence  after her surgery. The Oncotype DX test results were explained to her in details. Her recurrence score is 30, which predicts 20% 10 year risk of distant recurrence with tamoxifen alone. Although it is intermediate risk, her score is very close to high risk range, she is young and fit, I recommend adjuvant chemotherapy to reduce the risk of cancer recurrence. -Giving her early stage disease, I recommend adjuvant docetaxel and Cytoxan, every 3 weeks, for total 4 cycles. -Chemotherapy consent: Side effects including but does not not limited to, fatigue, nausea, vomiting, diarrhea, hair loss (including small risk of permanent alopecia), neuropathy, fluid retention, renal and kidney dysfunction, neutropenic fever, needed for blood transfusion, bleeding, were discussed with patient in great detail. She agrees to proceed. -Giving her strong ER/PR positivity, I recommend adjuvant endocrine therapy with aromatase inhibitor for 5-10 years. -She would also need adjuvant breast irradiation for local control, she was seen by radiation oncologist Dr. Valere Dross recently and will start radiation after she completes adjuvant chemo   2. Anxiety and depression -She has been having anxiety and insomnia since her cancer diagnosis -I refilled her xanax today. She can take 2 tab as needed for insomnia   3. Chest pain -atypical for angina, she does not have high risks for coronary artery disease -I think her chest pain is probably related to her acid reflux, I recommend her to take omeprazole 20 mg twice a day, instead of 40 mg daily at night, and she is also using Tums as needed -She will also follow-up with her primary care physician.  Plan -start chemo TC on 12/15/2014 -chemo class -I will see her on 11/9   All questions were answered. The patient knows to call the clinic with any problems, questions or concerns. I spent 25 minutes counseling the patient face to face. The total time spent in the appointment was 30  minutes and more than 50% was on counseling.     Truitt Merle, MD 12/06/2014 9:26 AM

## 2014-12-06 NOTE — Telephone Encounter (Signed)
per pof to sch pt trmt-sent MWemail to sch pt trmt-pt to get updated sch @ chemo ed class 11/3-gave pt avs

## 2014-12-06 NOTE — Telephone Encounter (Signed)
Per staff message and POF I have scheduled appts. Advised scheduler of appts. JMW  

## 2014-12-07 ENCOUNTER — Encounter: Payer: Self-pay | Admitting: Hematology

## 2014-12-07 ENCOUNTER — Other Ambulatory Visit: Payer: Self-pay | Admitting: *Deleted

## 2014-12-07 DIAGNOSIS — C50412 Malignant neoplasm of upper-outer quadrant of left female breast: Secondary | ICD-10-CM

## 2014-12-07 MED ORDER — UNABLE TO FIND: 1.0000 [IU] | Status: DC | PRN

## 2014-12-09 ENCOUNTER — Encounter: Payer: Self-pay | Admitting: *Deleted

## 2014-12-09 ENCOUNTER — Other Ambulatory Visit: Payer: BLUE CROSS/BLUE SHIELD

## 2014-12-09 ENCOUNTER — Encounter: Payer: Self-pay | Admitting: Hematology

## 2014-12-09 MED ORDER — ONDANSETRON HCL 8 MG PO TABS: 8.0000 mg | ORAL_TABLET | Freq: Two times a day (BID) | ORAL | Status: DC

## 2014-12-09 MED ORDER — DEXAMETHASONE 4 MG PO TABS: 8.0000 mg | ORAL_TABLET | Freq: Two times a day (BID) | ORAL | Status: DC

## 2014-12-09 NOTE — Progress Notes (Signed)
Introduced myself as her FA.  Informed her of the [facility] [place] program that is available for copay assistance for her treatment.  She believes she has met her deductible for the year so her insurance should pay at 100%.  I noticed she has 1 more treatment in Jan 2017.  If her plan renews on 02/06/15 I will enroll her in both foundations.  She will let me know.

## 2014-12-13 ENCOUNTER — Encounter: Payer: Self-pay | Admitting: Hematology

## 2014-12-14 ENCOUNTER — Other Ambulatory Visit: Payer: Self-pay | Admitting: *Deleted

## 2014-12-14 DIAGNOSIS — C50412 Malignant neoplasm of upper-outer quadrant of left female breast: Secondary | ICD-10-CM

## 2014-12-15 ENCOUNTER — Encounter: Payer: Self-pay | Admitting: Hematology

## 2014-12-15 ENCOUNTER — Ambulatory Visit (HOSPITAL_BASED_OUTPATIENT_CLINIC_OR_DEPARTMENT_OTHER): Payer: BLUE CROSS/BLUE SHIELD | Admitting: Hematology

## 2014-12-15 ENCOUNTER — Encounter: Payer: Self-pay | Admitting: *Deleted

## 2014-12-15 ENCOUNTER — Ambulatory Visit (HOSPITAL_BASED_OUTPATIENT_CLINIC_OR_DEPARTMENT_OTHER): Payer: BLUE CROSS/BLUE SHIELD

## 2014-12-15 ENCOUNTER — Telehealth: Payer: Self-pay | Admitting: Hematology

## 2014-12-15 ENCOUNTER — Other Ambulatory Visit (HOSPITAL_BASED_OUTPATIENT_CLINIC_OR_DEPARTMENT_OTHER): Payer: BLUE CROSS/BLUE SHIELD

## 2014-12-15 VITALS — BP 148/74 | HR 58 | Temp 97.8°F | Resp 18 | Ht 61.0 in | Wt 158.8 lb

## 2014-12-15 VITALS — BP 123/67 | HR 54 | Temp 98.9°F | Resp 16

## 2014-12-15 DIAGNOSIS — Z5111 Encounter for antineoplastic chemotherapy: Secondary | ICD-10-CM

## 2014-12-15 DIAGNOSIS — M545 Low back pain: Secondary | ICD-10-CM

## 2014-12-15 DIAGNOSIS — C50412 Malignant neoplasm of upper-outer quadrant of left female breast: Secondary | ICD-10-CM

## 2014-12-15 DIAGNOSIS — Z170 Estrogen receptor positive status [ER+]: Secondary | ICD-10-CM

## 2014-12-15 DIAGNOSIS — K219 Gastro-esophageal reflux disease without esophagitis: Secondary | ICD-10-CM

## 2014-12-15 DIAGNOSIS — F418 Other specified anxiety disorders: Secondary | ICD-10-CM

## 2014-12-15 LAB — COMPREHENSIVE METABOLIC PANEL (CC13)
ALBUMIN: 3.9 g/dL (ref 3.5–5.0)
ALK PHOS: 79 U/L (ref 40–150)
ALT: 23 U/L (ref 0–55)
AST: 18 U/L (ref 5–34)
Anion Gap: 12 mEq/L — ABNORMAL HIGH (ref 3–11)
BILIRUBIN TOTAL: 0.31 mg/dL (ref 0.20–1.20)
BUN: 16.7 mg/dL (ref 7.0–26.0)
CO2: 21 mEq/L — ABNORMAL LOW (ref 22–29)
CREATININE: 0.8 mg/dL (ref 0.6–1.1)
Calcium: 9.7 mg/dL (ref 8.4–10.4)
Chloride: 104 mEq/L (ref 98–109)
EGFR: 89 mL/min/{1.73_m2} — AB (ref >90)
GLUCOSE: 126 mg/dL (ref 70–140)
Potassium: 4.2 mEq/L (ref 3.5–5.1)
SODIUM: 136 meq/L (ref 136–145)
TOTAL PROTEIN: 7 g/dL (ref 6.4–8.3)

## 2014-12-15 LAB — CBC WITH DIFFERENTIAL/PLATELET
BASO%: 0.5 % (ref 0.0–2.0)
BASOS ABS: 0.1 10*3/uL (ref 0.0–0.1)
EOS ABS: 0 10*3/uL (ref 0.0–0.5)
EOS%: 0 % (ref 0.0–7.0)
HCT: 38.6 % (ref 34.8–46.6)
HEMOGLOBIN: 13.1 g/dL (ref 11.6–15.9)
LYMPH%: 7.2 % — AB (ref 14.0–49.7)
MCH: 30.3 pg (ref 25.1–34.0)
MCHC: 33.8 g/dL (ref 31.5–36.0)
MCV: 89.5 fL (ref 79.5–101.0)
MONO#: 0.3 10*3/uL (ref 0.1–0.9)
MONO%: 2.2 % (ref 0.0–14.0)
NEUT%: 90.1 % — AB (ref 38.4–76.8)
NEUTROS ABS: 13.4 10*3/uL — AB (ref 1.5–6.5)
Platelets: 309 10*3/uL (ref 145–400)
RBC: 4.32 10*6/uL (ref 3.70–5.45)
RDW: 13 % (ref 11.2–14.5)
WBC: 14.8 10*3/uL — ABNORMAL HIGH (ref 3.9–10.3)
lymph#: 1.1 10*3/uL (ref 0.9–3.3)

## 2014-12-15 MED ORDER — SODIUM CHLORIDE 0.9 % IV SOLN
Freq: Once | INTRAVENOUS | Status: AC
  Administered 2014-12-15: 11:00:00 via INTRAVENOUS
  Filled 2014-12-15: qty 8

## 2014-12-15 MED ORDER — DOCETAXEL CHEMO INJECTION 160 MG/16ML
75.0000 mg/m2 | Freq: Once | INTRAVENOUS | Status: AC
  Administered 2014-12-15: 130 mg via INTRAVENOUS
  Filled 2014-12-15: qty 13

## 2014-12-15 MED ORDER — SODIUM CHLORIDE 0.9 % IV SOLN
Freq: Once | INTRAVENOUS | Status: AC
  Administered 2014-12-15: 10:00:00 via INTRAVENOUS

## 2014-12-15 MED ORDER — CYCLOPHOSPHAMIDE CHEMO INJECTION 1 GM
600.0000 mg/m2 | Freq: Once | INTRAMUSCULAR | Status: AC
  Administered 2014-12-15: 1060 mg via INTRAVENOUS
  Filled 2014-12-15: qty 53

## 2014-12-15 NOTE — Progress Notes (Signed)
Spoke w/ pt, she informed me that her insurance renews in June 2017 so there is no need to enroll her in PAF or the [place] [facility] program since her insurance should pay at 100%.  She has my card in case anything changes.

## 2014-12-15 NOTE — Progress Notes (Signed)
[place]  Telephone:([phone]) [phone] Fax:(336) [phone]  Clinic follow up Note   Patient Care Team: Lupita Dawn, MD as PCP - General (Family Medicine) Stark Klein, MD as Consulting Physician (General Surgery) Truitt Merle, MD as Consulting Physician (Hematology) Arloa Koh, MD as Consulting Physician (Radiation Oncology) Rockwell Germany, RN as Registered Nurse Mauro Kaufmann, RN as Registered Nurse Sylvan Cheese, NP as Nurse Practitioner (Nurse Practitioner) 12/15/2014  CHIEF COMPLAINTS:  Follow up left breast cancer  Oncology History   Breast cancer of upper-outer quadrant of left female breast [facility])   Staging form: Breast, AJCC 7th Edition     Clinical stage from 11/10/2014: Stage IIA (T2, N0, M0) - Signed by Truitt Merle, MD on 11/10/2014     Pathologic stage from 11/18/2014: Stage IB (T1c, N31mi, cM0) - Signed by Enid Cutter, MD on 12/01/2014       Staging comments: Staged on lumpectomy specimen by Dr. Tresa Moore          Breast cancer of upper-outer quadrant of left female breast ([place])   10/28/2014 Mammogram Screening and the diagnostic mammogram shows a 2.1 x 1.3 x 2.1 cm it irregular mass in the left breast 2:00 position, 2 cm from the nipple.   11/01/2014 Receptors her2 ER 100%, PR100%+, HER2-, KI67 60%    11/01/2014 Initial Biopsy left breast biopsy showed invasive ductal carcinoma, grade 2, and DCIS    11/04/2014 Initial Diagnosis Breast cancer of upper-outer quadrant of left female breast ([place])   11/16/2014 Oncotype testing RS 30, predicts 20% 10-y risk of distant recurrence with tamoxifen alone    11/16/2014 Surgery left breast lumpectomy iwth SLN biopsy.    11/16/2014 Pathology Results left breast invasive ductal carcinoma, G3, 1 out of 3 SLN had micrometastasis, (-)LVI, margines negative,  (+) high grade DCIS    HISTORY OF PRESENTING ILLNESS:  Shirley Howard 54 y.o. female is here because of her recently diagnosed left breast cancer. She is  accompanied by her husband, daughter, mother to our multidisciplinary breast clinic today.  She felt a lump in her left breast one  Month ago, mild tender, she otherwise feels well no other new symptoms. This was developed by her primary care physician and screening mammogram was obtained. Mammogram reviewed a 2.1 cm mass in the left breast 2:00 position, core needle biopsy showed invasive ductal carcinoma, and DCIS.  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 She feels well overall. She does have mild low back pain, does not take any education. She is physically active at home, but does not exercise regularly. No family history of breast cancer. She was on hormone replacement for the past 8 years, and just stopped progesterone a few weeks ago.  INTERIM HISTORY  [name] returns for follow up and first cycle chemotherapy. She is doing well overall, recovered well. She denies any significant pain or other complaints. She has good appetite and eating more. She participated the chemotherapy class, and is ready to start chemotherapy.  MEDICAL HISTORY:  Past Medical History  Diagnosis Date  . Wrist pain   . Sore throat 10/27/2012    10/31/12 [facility] (see scanned document) - vocal cord polyp left, bilateral edema, significant postcricoid edema, c/w chronic tobacco use and chronic reflux. - Recheck 2 months - Encouraged tobacco cessation to reduce risk of laryngeal cancer     . Anxiety   . Depression   . Hot flashes   . GERD (gastroesophageal reflux  disease)     SURGICAL HISTORY: Past Surgical History  Procedure Laterality Date  . Cyst removal hand    . Tubal ligation  1995  . Breast lumpectomy with axillary lymph node biopsy Left 11/16/2014    Procedure: LEFT BREAST LUMPECTOMY WITH AXILLARY LYMPH NODE BIOPSY;  Surgeon: Stark Klein, MD;  Location: [facility];  Service: General;  Laterality: Left;   GYN HISTORY  Menarchal: 10 LMP: 48 Contraceptive:  16 HRT: 8 years, stopped in 10/2014  G2P2:    SOCIAL HISTORY: Social History   Social History  . Marital Status: Married    Spouse Name: N/A  . Number of Children: N/A  . Years of Education: N/A   Occupational History  . Not on file.   Social History Main Topics  . Smoking status: Former Smoker -- 1.50 packs/day for 40 years    Types: Cigarettes    Quit date: 11/04/2012  . Smokeless tobacco: Never Used  . Alcohol Use: Yes     Comment: social users   . Drug Use: No  . Sexual Activity:    Partners: Male     Comment: Husband   Other Topics Concern  . Not on file   Social History Narrative   Lives with husband in [place].   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 Supraventricular tachycardia Mother   . Thyroid disease Mother 21    unsure what type   . Cancer Father     throat cancer - smoker  . Stroke Father     ALLERGIES:  is allergic to codeine.  MEDICATIONS:  Current Outpatient Prescriptions  Medication Sig Dispense Refill  . ALPRAZolam (XANAX) 0.25 MG tablet Take 1-2 tablets (0.25-0.5 mg total) by mouth at bedtime as needed for anxiety or sleep. 50 tablet 0  . aspirin 81 MG tablet Take 81 mg by mouth daily.    . calcium carbonate (OS-CAL) 600 MG TABS tablet Take 600 mg by mouth daily with breakfast.    . cholecalciferol (VITAMIN D) 1000 UNITS tablet Take 2,000 Units by mouth daily.    Marland Kitchen dexamethasone (DECADRON) 4 MG tablet Take 2 tablets (8 mg total) by mouth 2 (two) times daily. Start the day before Taxotere. Then again the day after chemo for 3 days. 30 tablet 1  . diphenhydrAMINE (SOMINEX) 25 MG tablet Take 50 mg by mouth at bedtime as needed for sleep.     . fish oil-omega-3 fatty acids 1000 MG capsule Take 2 g by mouth daily.    . fluticasone (FLONASE) 50 MCG/ACT nasal spray  Place 2 sprays into both nostrils daily. 16 g 0  . Inulin (PHILLIPS FIBER GOOD PO) Take by mouth.    . Multiple Vitamin (MULTIVITAMIN) tablet Take 1 tablet by mouth daily.    Marland Kitchen omeprazole (PRILOSEC) 40 MG capsule Take one tablet daily.    . ondansetron (ZOFRAN) 8 MG tablet Take 1 tablet (8 mg total) by mouth 2 (two) times daily. Start the day after chemo for 3 days. Then take as needed for nausea or vomiting. 30 tablet 1  . oxyCODONE-acetaminophen (PERCOCET/ROXICET) 5-325 MG tablet   0  . UNABLE TO FIND 1 Units by Does not apply route as needed. Please provide cranial prosthesis due to hair  loss from chemotherapy. 1 each 1   No current facility-administered medications for this visit.    REVIEW OF SYSTEMS:   Constitutional: Denies fevers, chills or abnormal night sweats Eyes: Denies blurriness of vision, double vision or watery eyes Ears, nose, mouth, throat, and face: Denies mucositis or sore throat Respiratory: Denies cough, dyspnea or wheezes Cardiovascular: Denies palpitation, chest discomfort or lower extremity swelling Gastrointestinal:  Denies nausea, heartburn or change in bowel habits Skin: Denies abnormal skin rashes Lymphatics: Denies new lymphadenopathy or easy bruising Neurological:Denies numbness, tingling or new weaknesses Behavioral/Psych: Mood is stable, no new changes  All other systems were reviewed with the patient and are negative.  PHYSICAL EXAMINATION: ECOG PERFORMANCE STATUS: 0 - Asymptomatic  Filed Vitals:   12/15/14 0926  BP: 148/74  Pulse: 58  Temp: 97.8 F (36.6 C)  Resp: 18   Filed Weights   12/15/14 0926  Weight: 158 lb 12.8 oz (72.031 kg)    GENERAL:alert, no distress and comfortable SKIN: skin color, texture, turgor are normal, no rashes or significant lesions EYES: normal, conjunctiva are pink and non-injected, sclera clear OROPHARYNX:no exudate, no erythema and lips, buccal mucosa, and tongue normal  NECK: supple, thyroid normal size,  non-tender, without nodularity LYMPH:  no palpable lymphadenopathy in the cervical, axillary or inguinal LUNGS: clear to auscultation and percussion with normal breathing effort HEART: regular rate & rhythm and no murmurs and no lower extremity edema ABDOMEN:abdomen soft, non-tender and normal bowel sounds Musculoskeletal:no cyanosis of digits and no clubbing  PSYCH: alert & oriented x 3 with fluent speech NEURO: no focal motor/sensory deficits Breasts: Breast inspection showed them to be symmetrical with no nipple discharge. The left breast incision is healing well, clean, no surrounding skin erythema. There is fullness underneath the surgical incision, likely surgical scar or seroma. Palpation of the right breast and bilateral axilla with showed no palpable mass or adenopathy.  LABORATORY DATA:  I have reviewed the data as listed Lab Results  Component Value Date   WBC 14.8* 12/15/2014   HGB 13.1 12/15/2014   HCT 38.6 12/15/2014   MCV 89.5 12/15/2014   PLT 309 12/15/2014    Recent Labs  11/10/14 0832 12/15/14 0841  NA 141 136  K 4.1 4.2  CO2 28 21*  GLUCOSE 89 126  BUN 11.4 16.7  CREATININE 0.8 0.8  CALCIUM 9.5 9.7  PROT 7.1 7.0  ALBUMIN 4.0 3.9  AST 18 18  ALT 25 23  ALKPHOS 81 79  BILITOT 0.40 0.31    Pathology report Diagnosis 1. Breast, lumpectomy, Left - INVASIVE DUCTAL CARCINOMA, GRADE 3, SPANNING 2 CM. - DUCTAL CARCINOMA IN SITU, HIGH GRADE. - RESECTION MARGINS ARE NEGATIVE FOR INVASIVE AND IN SITU CARCINOMA. - LOBULAR NEOPLASIA (ATYPICAL LOBULAR HYPERPLASIA). - SEE ONCOLOGY TABLE. 2. Lymph node, sentinel, biopsy, Left axillary #1 - ONE OF ONE LYMPH NODES NEGATIVE FOR CARCINOMA (0/1). 3. Lymph node, sentinel, biopsy, Left axillary #2 - MICROMETASTASIS IN ONE OF ONE LYMPH NODE (1/1). 4. Lymph node, sentinel, biopsy, Left axillary #3 - ONE OF ONE LYMPH NODES NEGATIVE FOR CARCINOMA (0/1).  Microscopic Comment 1. BREAST, INVASIVE TUMOR, WITH LYMPH NODES  PRESENT Specimen, including laterality and lymph node sampling (sentinel, non-sentinel): Left breast lump and left axillary sentinel lymph nodes. Procedure: Left breast lumpectomy and left axillary sentinel lymph node biopsies. Histologic type: Invasive ductal carcinoma. Grade: 3 Tubule formation: 3 Nuclear pleomorphism: 3 Mitotic: 2 Tumor size (gross measurement): 2 cm Margins: Invasive, distance to closest margin: 0.2 cm (inferior).  In-situ, distance to closest margin: 0.4 cm (superior). If margin positive, focally or broadly: N/A. Lymphovascular invasion: Definitive invasion not identified. Ductal carcinoma in situ: Present. Grade: III Extensive intraductal component: No. Lobular neoplasia: Present (atypical lobular hyperplasia). Tumor focality: Unifocal. Treatment effect: N/A. Extent of tumor: Confined to breast parenchyma. Lymph nodes: Examined: 3 Sentinel 0 Non-sentinel 3 Total Lymph nodes with metastasis: 1 Isolated tumor cells (< 0.2 mm): 0 Micrometastasis: (> 0.2 mm and < 2.0 mm): 1 Macrometastasis: (> 2.0 mm): 0 Extracapsular extension: No. Breast prognostic profile: Performed on biopsy ([id number]). Her2 will be repeated on current specimen. Estrogen receptor: Positive, strong staining intensity. Progesterone receptor: Positive, strong staining intensity. Her 2 neu: Negative. Ki-67: 60%. Non-neoplastic breast: Fibrocystic change. TNM: pT1c, pN54mi  Results: HER2 - NEGATIVE RATIO OF HER2/CEP17 SIGNALS 1.15 AVERAGE HER2 COPY NUMBER PER CELL 3.15  Oncotype Dx RS: 30, predicts 20% 10-y risk of distant recurrence with tamoxifen alone   RADIOGRAPHIC STUDIES: I have personally reviewed the radiological images as listed and agreed with the findings in the report.  No new scans  ASSESSMENT & PLAN:  54 year old Caucasian female, postmenopausal, presented with palpable left breast mass  1. Left breast invasive ductal carcinoma, [id number], stage IB, grade 3, ER  positive, PR positive, HER-2 negative, (+) DCIS -I reviewed her surgical pathology results in great details with patient and her husband. -She is now status post lumpectomy and sentinel lymph node biopsy. Surgical margins were negative. She has early-stage breast cancer, likely acute by complete surgical resection -I discussed the risk of cancer recurrence after her surgery. The Oncotype DX test results were explained to her in details. Her recurrence score is 30, which predicts 20% 10 year risk of distant recurrence with tamoxifen alone. Although it is intermediate risk, her score is very close to high risk range, she is young and fit, I recommend adjuvant chemotherapy to reduce the risk of cancer recurrence. -Giving her early stage disease, I recommend adjuvant docetaxel and Cytoxan, every 3 weeks, for total 4 cycles. -Lab reviewed, adequate for treatment, will start cycle 1 chemotherapy today. Main side effects were reviewed with her again, especially neutropenia fever. -I discussed the role of Neulasta to prevent neutropenia fever. She is pretty healthy and no other comorbidities, we'll hold on Neulasta with first cycle him a will consider Neulasta with subsequent cycle if she has severe neutropenia. -Giving her strong ER/PR positivity, I recommend adjuvant endocrine therapy with aromatase inhibitor for 5-10 years. -She would also need adjuvant breast irradiation for local control, she was seen by radiation oncologist Dr. Valere Dross recently and will start radiation after she completes adjuvant chemo   2. Anxiety and depression -stable. She takes xanax as needed.   3. GERD -She is on omeprazole, and takes Tums and night  -we discussed dexa around chemo may exacerbate her acid reflux.  Plan -first cycle chemoTC today. No neulasta  -Return to clinic in 9-10 days for toxicity check up and lab  All questions were answered. The patient knows to call the clinic with any problems, questions or  concerns. I spent 15 minutes counseling the patient face to face. The total time spent in the appointment was 20 minutes and more than 50% was on counseling.     Truitt Merle, MD 12/15/2014 2:40 PM

## 2014-12-15 NOTE — Patient Instructions (Addendum)
[facility] Discharge Instructions for Patients Receiving Chemotherapy  Today you received the following chemotherapy agents: Taxotere and Cytoxan.  To help prevent nausea and vomiting after your treatment, we encourage you to take your nausea medication: Zofran 8 mg 2x a day for 3 days after chemo.   If you develop nausea and vomiting that is not controlled by your nausea medication, call the clinic.   BELOW ARE SYMPTOMS THAT SHOULD BE REPORTED IMMEDIATELY:  *FEVER GREATER THAN 100.5 F  *CHILLS WITH OR WITHOUT FEVER  NAUSEA AND VOMITING THAT IS NOT CONTROLLED WITH YOUR NAUSEA MEDICATION  *UNUSUAL SHORTNESS OF BREATH  *UNUSUAL BRUISING OR BLEEDING  TENDERNESS IN MOUTH AND THROAT WITH OR WITHOUT PRESENCE OF ULCERS  *URINARY PROBLEMS  *BOWEL PROBLEMS  UNUSUAL RASH Items with * indicate a potential emergency and should be followed up as soon as possible.  Feel free to call the clinic you have any questions or concerns. The clinic phone number is [phone].  Please show the [facility] at check-in to the Emergency Department and triage nurse.  Docetaxel injection What is this medicine? DOCETAXEL (doe se TAX el) is a chemotherapy drug. It targets fast dividing cells, like cancer cells, and causes these cells to die. This medicine is used to treat many types of cancers like breast cancer, certain stomach cancers, head and neck cancer, lung cancer, and prostate cancer. This medicine may be used for other purposes; ask your health care provider or pharmacist if you have questions. What should I tell my health care provider before I take this medicine? They need to know if you have any of these conditions: -infection (especially a virus infection such as chickenpox, cold sores, or herpes) -liver disease -low blood counts, like low white cell, platelet, or red cell counts -an unusual or allergic reaction to docetaxel, polysorbate 80, other chemotherapy agents,  other medicines, foods, dyes, or preservatives -pregnant or trying to get pregnant -breast-feeding How should I use this medicine? This drug is given as an infusion into a vein. It is administered in a hospital or clinic by a specially trained health care professional. Talk to your pediatrician regarding the use of this medicine in children. Special care may be needed. Overdosage: If you think you have taken too much of this medicine contact a poison control center or emergency room at once. NOTE: This medicine is only for you. Do not share this medicine with others. What if I miss a dose? It is important not to miss your dose. Call your doctor or health care professional if you are unable to keep an appointment. What may interact with this medicine? -cyclosporine -erythromycin -ketoconazole -medicines to increase blood counts like filgrastim, pegfilgrastim, sargramostim -vaccines Talk to your doctor or health care professional before taking any of these medicines: -acetaminophen -aspirin -ibuprofen -ketoprofen -naproxen This list may not describe all possible interactions. Give your health care provider a list of all the medicines, herbs, non-prescription drugs, or dietary supplements you use. Also tell them if you smoke, drink alcohol, or use illegal drugs. Some items may interact with your medicine. What should I watch for while using this medicine? Your condition will be monitored carefully while you are receiving this medicine. You will need important blood work done while you are taking this medicine. This drug may make you feel generally unwell. This is not uncommon, as chemotherapy can affect healthy cells as well as cancer cells. Report any side effects. Continue your course of treatment even  though you feel ill unless your doctor tells you to stop. In some cases, you may be given additional medicines to help with side effects. Follow all directions for their use. Call your doctor  or health care professional for advice if you get a fever, chills or sore throat, or other symptoms of a cold or flu. Do not treat yourself. This drug decreases your body's ability to fight infections. Try to avoid being around people who are sick. This medicine may increase your risk to bruise or bleed. Call your doctor or health care professional if you notice any unusual bleeding. This medicine may contain alcohol in the product. You may get drowsy or dizzy. Do not drive, use machinery, or do anything that needs mental alertness until you know how this medicine affects you. Do not stand or sit up quickly, especially if you are an older patient. This reduces the risk of dizzy or fainting spells. Avoid alcoholic drinks. Do not become pregnant while taking this medicine. Women should inform their doctor if they wish to become pregnant or think they might be pregnant. There is a potential for serious side effects to an unborn child. Talk to your health care professional or pharmacist for more information. Do not breast-feed an infant while taking this medicine. What side effects may I notice from receiving this medicine? Side effects that you should report to your doctor or health care professional as soon as possible: -allergic reactions like skin rash, itching or hives, swelling of the face, lips, or tongue -low blood counts - This drug may decrease the number of white blood cells, red blood cells and platelets. You may be at increased risk for infections and bleeding. -signs of infection - fever or chills, cough, sore throat, pain or difficulty passing urine -signs of decreased platelets or bleeding - bruising, pinpoint red spots on the skin, black, tarry stools, nosebleeds -signs of decreased red blood cells - unusually weak or tired, fainting spells, lightheadedness -breathing problems -fast or irregular heartbeat -low blood pressure -mouth sores -nausea and vomiting -pain, swelling, redness or  irritation at the injection site -pain, tingling, numbness in the hands or feet -swelling of the ankle, feet, hands -weight gain Side effects that usually do not require medical attention (report to your prescriber or health care professional if they continue or are bothersome): -bone pain -complete hair loss including hair on your head, underarms, pubic hair, eyebrows, and eyelashes -diarrhea -excessive tearing -changes in the color of fingernails -loosening of the fingernails -nausea -muscle pain -red flush to skin -sweating -weak or tired This list may not describe all possible side effects. Call your doctor for medical advice about side effects. You may report side effects to FDA at 1-800-FDA-1088. Where should I keep my medicine? This drug is given in a hospital or clinic and will not be stored at home. NOTE: This sheet is a summary. It may not cover all possible information. If you have questions about this medicine, talk to your doctor, pharmacist, or health care provider.    2016, [employer]vier/Gold Standard. (2014-02-08 16:04:57)   Cyclophosphamide injection What is this medicine? CYCLOPHOSPHAMIDE (sye kloe FOSS fa mide) is a chemotherapy drug. It slows the growth of cancer cells. This medicine is used to treat many types of cancer like lymphoma, myeloma, leukemia, breast cancer, and ovarian cancer, to name a few. This medicine may be used for other purposes; ask your health care provider or pharmacist if you have questions. What should I tell my health  care provider before I take this medicine? They need to know if you have any of these conditions: -blood disorders -history of other chemotherapy -infection -kidney disease -liver disease -recent or ongoing radiation therapy -tumors in the bone marrow -an unusual or allergic reaction to cyclophosphamide, other chemotherapy, other medicines, foods, dyes, or preservatives -pregnant or trying to get  pregnant -breast-feeding How should I use this medicine? This drug is usually given as an injection into a vein or muscle or by infusion into a vein. It is administered in a hospital or clinic by a specially trained health care professional. Talk to your pediatrician regarding the use of this medicine in children. Special care may be needed. Overdosage: If you think you have taken too much of this medicine contact a poison control center or emergency room at once. NOTE: This medicine is only for you. Do not share this medicine with others. What if I miss a dose? It is important not to miss your dose. Call your doctor or health care professional if you are unable to keep an appointment. What may interact with this medicine? This medicine may interact with the following medications: -amiodarone -amphotericin B -azathioprine -certain antiviral medicines for HIV or AIDS such as protease inhibitors (e.g., indinavir, ritonavir) and zidovudine -certain blood pressure medications such as benazepril, captopril, enalapril, fosinopril, lisinopril, moexipril, monopril, perindopril, quinapril, ramipril, trandolapril -certain cancer medications such as anthracyclines (e.g., daunorubicin, doxorubicin), busulfan, cytarabine, paclitaxel, pentostatin, tamoxifen, trastuzumab -certain diuretics such as chlorothiazide, chlorthalidone, hydrochlorothiazide, indapamide, metolazone -certain medicines that treat or prevent blood clots like warfarin -certain muscle relaxants such as succinylcholine -cyclosporine -etanercept -indomethacin -medicines to increase blood counts like filgrastim, pegfilgrastim, sargramostim -medicines used as general anesthesia -metronidazole -natalizumab This list may not describe all possible interactions. Give your health care provider a list of all the medicines, herbs, non-prescription drugs, or dietary supplements you use. Also tell them if you smoke, drink alcohol, or use illegal  drugs. Some items may interact with your medicine. What should I watch for while using this medicine? Visit your doctor for checks on your progress. This drug may make you feel generally unwell. This is not uncommon, as chemotherapy can affect healthy cells as well as cancer cells. Report any side effects. Continue your course of treatment even though you feel ill unless your doctor tells you to stop. Drink water or other fluids as directed. Urinate often, even at night. In some cases, you may be given additional medicines to help with side effects. Follow all directions for their use. Call your doctor or health care professional for advice if you get a fever, chills or sore throat, or other symptoms of a cold or flu. Do not treat yourself. This drug decreases your body's ability to fight infections. Try to avoid being around people who are sick. This medicine may increase your risk to bruise or bleed. Call your doctor or health care professional if you notice any unusual bleeding. Be careful brushing and flossing your teeth or using a toothpick because you may get an infection or bleed more easily. If you have any dental work done, tell your dentist you are receiving this medicine. You may get drowsy or dizzy. Do not drive, use machinery, or do anything that needs mental alertness until you know how this medicine affects you. Do not become pregnant while taking this medicine or for 1 year after stopping it. Women should inform their doctor if they wish to become pregnant or think they might be pregnant. Men  should not father a child while taking this medicine and for 4 months after stopping it. There is a potential for serious side effects to an unborn child. Talk to your health care professional or pharmacist for more information. Do not breast-feed an infant while taking this medicine. This medicine may interfere with the ability to have a child. This medicine has caused ovarian failure in some women.  This medicine has caused reduced sperm counts in some men. You should talk with your doctor or health care professional if you are concerned about your fertility. If you are going to have surgery, tell your doctor or health care professional that you have taken this medicine. What side effects may I notice from receiving this medicine? Side effects that you should report to your doctor or health care professional as soon as possible: -allergic reactions like skin rash, itching or hives, swelling of the face, lips, or tongue -low blood counts - this medicine may decrease the number of white blood cells, red blood cells and platelets. You may be at increased risk for infections and bleeding. -signs of infection - fever or chills, cough, sore throat, pain or difficulty passing urine -signs of decreased platelets or bleeding - bruising, pinpoint red spots on the skin, black, tarry stools, blood in the urine -signs of decreased red blood cells - unusually weak or tired, fainting spells, lightheadedness -breathing problems -dark urine -dizziness -palpitations -swelling of the ankles, feet, hands -trouble passing urine or change in the amount of urine -weight gain -yellowing of the eyes or skin Side effects that usually do not require medical attention (report to your doctor or health care professional if they continue or are bothersome): -changes in nail or skin color -hair loss -missed menstrual periods -mouth sores -nausea, vomiting This list may not describe all possible side effects. Call your doctor for medical advice about side effects. You may report side effects to FDA at 1-800-FDA-1088. Where should I keep my medicine? This drug is given in a hospital or clinic and will not be stored at home. NOTE: This sheet is a summary. It may not cover all possible information. If you have questions about this medicine, talk to your doctor, pharmacist, or health care provider.    2016,  [employer]vier/[employer] Standard. (2011-12-07 16:22:58)

## 2014-12-15 NOTE — Telephone Encounter (Signed)
Gave patient avs report and appointments for November and December  °

## 2014-12-16 ENCOUNTER — Encounter: Payer: Self-pay | Admitting: *Deleted

## 2014-12-16 ENCOUNTER — Ambulatory Visit: Payer: BLUE CROSS/BLUE SHIELD

## 2014-12-16 NOTE — Progress Notes (Signed)
[facility] Work  Clinical Social Work was referred by nurse for assessment of psychosocial needs due to request for ADR completion.  Clinical Social Worker contacted patient at home to offer support and assess for needs.  CSW introduced self and explained role of CSW team. Pt reports she is adjusting well currently and is eager to complete ADRs for she and her husband. She is trying to coordinate his work schedule and then will reach out for appointment with [facility] team to notarize documents. Pt is open to CSW checking in at next appt on 12/24/14. Pt aware to reach out if needs arise.    Clinical Social Work interventions: Resource education ADR education  Loren Racer, [facility] Clinical Social Worker [place]  [place] Phone: [phone] Fax: [phone]

## 2014-12-20 ENCOUNTER — Encounter: Payer: Self-pay | Admitting: Hematology

## 2014-12-24 ENCOUNTER — Other Ambulatory Visit (HOSPITAL_BASED_OUTPATIENT_CLINIC_OR_DEPARTMENT_OTHER): Payer: BLUE CROSS/BLUE SHIELD

## 2014-12-24 ENCOUNTER — Encounter: Payer: Self-pay | Admitting: *Deleted

## 2014-12-24 ENCOUNTER — Ambulatory Visit (HOSPITAL_BASED_OUTPATIENT_CLINIC_OR_DEPARTMENT_OTHER): Payer: BLUE CROSS/BLUE SHIELD | Admitting: Hematology

## 2014-12-24 ENCOUNTER — Encounter: Payer: Self-pay | Admitting: Hematology

## 2014-12-24 VITALS — BP 113/65 | HR 77 | Temp 98.2°F | Resp 18 | Ht 61.0 in | Wt 155.1 lb

## 2014-12-24 DIAGNOSIS — C50412 Malignant neoplasm of upper-outer quadrant of left female breast: Secondary | ICD-10-CM

## 2014-12-24 LAB — CBC WITH DIFFERENTIAL/PLATELET
BASO%: 0.7 % (ref 0.0–2.0)
BASOS ABS: 0 10*3/uL (ref 0.0–0.1)
EOS%: 0.7 % (ref 0.0–7.0)
Eosinophils Absolute: 0 10*3/uL (ref 0.0–0.5)
HEMATOCRIT: 37 % (ref 34.8–46.6)
HGB: 12.4 g/dL (ref 11.6–15.9)
LYMPH#: 1.1 10*3/uL (ref 0.9–3.3)
LYMPH%: 74.8 % — ABNORMAL HIGH (ref 14.0–49.7)
MCH: 30.2 pg (ref 25.1–34.0)
MCHC: 33.5 g/dL (ref 31.5–36.0)
MCV: 90.2 fL (ref 79.5–101.0)
MONO#: 0.3 10*3/uL (ref 0.1–0.9)
MONO%: 17.9 % — ABNORMAL HIGH (ref 0.0–14.0)
NEUT#: 0.1 10*3/uL — CL (ref 1.5–6.5)
NEUT%: 5.9 % — AB (ref 38.4–76.8)
PLATELETS: 278 10*3/uL (ref 145–400)
RBC: 4.1 10*6/uL (ref 3.70–5.45)
RDW: 12.5 % (ref 11.2–14.5)
WBC: 1.5 10*3/uL — ABNORMAL LOW (ref 3.9–10.3)

## 2014-12-24 LAB — COMPREHENSIVE METABOLIC PANEL (CC13)
ALT: 24 U/L (ref 0–55)
AST: 19 U/L (ref 5–34)
Albumin: 3.6 g/dL (ref 3.5–5.0)
Alkaline Phosphatase: 79 U/L (ref 40–150)
Anion Gap: 8 mEq/L (ref 3–11)
BILIRUBIN TOTAL: 0.35 mg/dL (ref 0.20–1.20)
BUN: 10.7 mg/dL (ref 7.0–26.0)
CO2: 28 meq/L (ref 22–29)
CREATININE: 0.8 mg/dL (ref 0.6–1.1)
Calcium: 9.4 mg/dL (ref 8.4–10.4)
Chloride: 104 mEq/L (ref 98–109)
EGFR: 89 mL/min/{1.73_m2} — ABNORMAL LOW (ref >90)
GLUCOSE: 97 mg/dL (ref 70–140)
Potassium: 4.7 mEq/L (ref 3.5–5.1)
Sodium: 139 mEq/L (ref 136–145)
Total Protein: 6.4 g/dL (ref 6.4–8.3)

## 2014-12-24 NOTE — Progress Notes (Signed)
Shirley Howard  Clinical Social Howard was referred by pt  to review and complete healthcare advance directives.  Clinical Social Worker met with patient in [facility] office.  The patient designated husband, Shirley Howard, as their primary healthcare agent and Shirley Howard as their secondary agent.  Patient also completed healthcare living will.    Clinical Social Worker notarized documents and made copies for patient/family. Clinical Social Worker will send documents to medical records to be scanned into patient's chart. Clinical Social Worker encouraged patient/family to contact with any additional questions or concerns.  Shirley Howard, [facility] Worker [place]  [place] Phone: [phone] Fax: [phone]

## 2014-12-24 NOTE — Progress Notes (Signed)
[place]  [phone] [phone]  Clinic follow up Note   Patient Care Team: Lupita Dawn, MD as PCP - General (Family Medicine) Stark Klein, MD as Consulting Physician (General Surgery) Truitt Merle, MD as Consulting Physician (Hematology) Arloa Koh, MD as Consulting Physician (Radiation Oncology) Rockwell Germany, RN as Registered Nurse Mauro Kaufmann, RN as Registered Nurse Sylvan Cheese, NP as Nurse Practitioner (Nurse Practitioner) 12/24/2014  CHIEF COMPLAINTS:  Follow up left breast cancer  Oncology History   Breast cancer of upper-outer quadrant of left female breast [facility])   Staging form: Breast, AJCC 7th Edition     Clinical stage from 11/10/2014: Stage IIA (T2, N0, M0) - Signed by Truitt Merle, MD on 11/10/2014     Pathologic stage from 11/18/2014: Stage IB (T1c, N45mi, cM0) - Signed by Enid Cutter, MD on 12/01/2014       Staging comments: Staged on lumpectomy specimen by Dr. Tresa Moore          Breast cancer of upper-outer quadrant of left female breast ([place])   10/28/2014 Mammogram Screening and the diagnostic mammogram shows a 2.1 x 1.3 x 2.1 cm it irregular mass in the left breast 2:00 position, 2 cm from the nipple.   11/01/2014 Receptors her2 ER 100%, PR100%+, HER2-, KI67 60%    11/01/2014 Initial Biopsy left breast biopsy showed invasive ductal carcinoma, grade 2, and DCIS    11/04/2014 Initial Diagnosis Breast cancer of upper-outer quadrant of left female breast ([place])   11/16/2014 Oncotype testing RS 30, predicts 20% 10-y risk of distant recurrence with tamoxifen alone    11/16/2014 Surgery left breast lumpectomy iwth SLN biopsy.    11/16/2014 Pathology Results left breast invasive ductal carcinoma, G3, 1 out of 3 SLN had micrometastasis, (-)LVI, margines negative,  (+) high grade DCIS   12/15/2014 -  Adjuvant Chemotherapy Docetaxel 75 mg/m, Cytoxan 600 mg/m, every 3 weeks, planning for total 4 cycles.    HISTORY OF  PRESENTING ILLNESS:  Shirley Howard 54 y.o. female is here because of her recently diagnosed left breast cancer. She is accompanied by her husband, daughter, mother to our multidisciplinary breast clinic today.  She felt a lump in her left breast one  Month ago, mild tender, she otherwise feels well no other new symptoms. This was developed by her primary care physician and screening mammogram was obtained. Mammogram reviewed a 2.1 cm mass in the left breast 2:00 position, core needle biopsy showed invasive ductal carcinoma, and DCIS.  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 She feels well overall. She does have mild low back pain, does not take any education. She is physically active at home, but does not exercise regularly. No family history of breast cancer. She was on hormone replacement for the past 8 years, and just stopped progesterone a few weeks ago.  CURRENT THERAPY: Docetaxel 75 mg/m, Cytoxan 600 mg/m, every 3 weeks, started on 12/15/2014  INTERIM HISTORY  [name] returns for follow up and a toxicity check up after first cycle chemotherapy. She tolerated well overall. She had mild epigastric discomfort and diarrhea for 3 days after chemotherapy, resolved afterwards. No nausea, fever, chills, or other complaints. Her appetite has been maintained well, and she is eating well.  MEDICAL HISTORY:  Past Medical History  Diagnosis Date  . Wrist pain   . Sore throat 10/27/2012    10/31/12 [facility] ENT (see scanned document) - vocal cord polyp left, bilateral edema, significant  postcricoid edema, c/w chronic tobacco use and chronic reflux. - Recheck 2 months - Encouraged tobacco cessation to reduce risk of laryngeal cancer     . Anxiety   . Depression   . Hot flashes   . GERD (gastroesophageal reflux disease)     SURGICAL HISTORY: Past Surgical History  Procedure Laterality Date  . Cyst removal hand    . Tubal ligation  1995  . Breast lumpectomy with  axillary lymph node biopsy Left 11/16/2014    Procedure: LEFT BREAST LUMPECTOMY WITH AXILLARY LYMPH NODE BIOPSY;  Surgeon: Stark Klein, MD;  Location: [facility];  Service: General;  Laterality: Left;   GYN HISTORY  Menarchal: 10 LMP: 48 Contraceptive: 16 HRT: 8 years, stopped in 10/2014  G2P2:    SOCIAL HISTORY: Social History   Social History  . Marital Status: Married    Spouse Name: N/A  . Number of Children: N/A  . Years of Education: N/A   Occupational History  . Not on file.   Social History Main Topics  . Smoking status: Former Smoker -- 1.50 packs/day for 40 years    Types: Cigarettes    Quit date: 11/04/2012  . Smokeless tobacco: Never Used  . Alcohol Use: Yes     Comment: social users   . Drug Use: No  . Sexual Activity:    Partners: Male     Comment: Husband   Other Topics Concern  . Not on file   Social History Narrative   Lives with husband in [place].   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 Supraventricular tachycardia Mother   . Thyroid disease Mother 62    unsure what type   . Cancer Father     throat cancer - smoker  . Stroke Father     ALLERGIES:  is allergic to codeine.  MEDICATIONS:  Current Outpatient Prescriptions  Medication Sig Dispense Refill  . ALPRAZolam (XANAX) 0.25 MG tablet Take 1-2 tablets (0.25-0.5 mg total) by mouth at bedtime as needed for anxiety or sleep. 50 tablet 0  . aspirin 81 MG tablet Take 81 mg by mouth daily.    . calcium carbonate (OS-CAL) 600 MG TABS tablet Take 600 mg by mouth daily with breakfast.    . cholecalciferol (VITAMIN D) 1000 UNITS tablet Take 2,000 Units by mouth daily.    Marland Kitchen dexamethasone (DECADRON) 4 MG tablet Take 2 tablets (8 mg total) by mouth 2 (two) times daily. Start the day before Taxotere. Then  again the day after chemo for 3 days. 30 tablet 1  . diphenhydrAMINE (SOMINEX) 25 MG tablet Take 50 mg by mouth at bedtime as needed for sleep.     . fish oil-omega-3 fatty acids 1000 MG capsule Take 2 g by mouth daily.    . fluticasone (FLONASE) 50 MCG/ACT nasal spray Place 2 sprays into both nostrils daily. 16 g 0  . Inulin (PHILLIPS FIBER GOOD PO) Take by mouth.    . Multiple Vitamin (MULTIVITAMIN) tablet Take 1 tablet by mouth daily.    Marland Kitchen omeprazole (PRILOSEC) 40 MG capsule Take one tablet daily.    . ondansetron (ZOFRAN) 8 MG tablet Take 1 tablet (8 mg total) by mouth 2 (two) times daily. Start the day after chemo for  3 days. Then take as needed for nausea or vomiting. 30 tablet 1  . oxyCODONE-acetaminophen (PERCOCET/ROXICET) 5-325 MG tablet   0  . UNABLE TO FIND 1 Units by Does not apply route as needed. Please provide cranial prosthesis due to hair loss from chemotherapy. 1 each 1   No current facility-administered medications for this visit.    REVIEW OF SYSTEMS:   Constitutional: Denies fevers, chills or abnormal night sweats Eyes: Denies blurriness of vision, double vision or watery eyes Ears, nose, mouth, throat, and face: Denies mucositis or sore throat Respiratory: Denies cough, dyspnea or wheezes Cardiovascular: Denies palpitation, chest discomfort or lower extremity swelling Gastrointestinal:  Denies nausea, heartburn or change in bowel habits Skin: Denies abnormal skin rashes Lymphatics: Denies new lymphadenopathy or easy bruising Neurological:Denies numbness, tingling or new weaknesses Behavioral/Psych: Mood is stable, no new changes  All other systems were reviewed with the patient and are negative.  PHYSICAL EXAMINATION: ECOG PERFORMANCE STATUS: 0 - Asymptomatic  Filed Vitals:   12/24/14 0830  BP: 113/65  Pulse: 77  Temp: 98.2 F (36.8 C)  Resp: 18   Filed Weights   12/24/14 0830  Weight: 155 lb 1.6 oz (70.353 kg)    GENERAL:alert, no distress and  comfortable SKIN: skin color, texture, turgor are normal, no rashes or significant lesions EYES: normal, conjunctiva are pink and non-injected, sclera clear OROPHARYNX:no exudate, no erythema and lips, buccal mucosa, and tongue normal  NECK: supple, thyroid normal size, non-tender, without nodularity LYMPH:  no palpable lymphadenopathy in the cervical, axillary or inguinal LUNGS: clear to auscultation and percussion with normal breathing effort HEART: regular rate & rhythm and no murmurs and no lower extremity edema ABDOMEN:abdomen soft, non-tender and normal bowel sounds Musculoskeletal:no cyanosis of digits and no clubbing  PSYCH: alert & oriented x 3 with fluent speech NEURO: no focal motor/sensory deficits Breasts: Breast inspection showed them to be symmetrical with no nipple discharge. The left breast incision is healing well, clean, no surrounding skin erythema. There is fullness underneath the surgical incision, likely surgical scar or seroma. Palpation of the right breast and bilateral axilla with showed no palpable mass or adenopathy.  LABORATORY DATA:  I have reviewed the data as listed Lab Results  Component Value Date   WBC 1.5* 12/24/2014   HGB 12.4 12/24/2014   HCT 37.0 12/24/2014   MCV 90.2 12/24/2014   PLT 278 12/24/2014    Recent Labs  11/10/14 0832 12/15/14 0841  NA 141 136  K 4.1 4.2  CO2 28 21*  GLUCOSE 89 126  BUN 11.4 16.7  CREATININE 0.8 0.8  CALCIUM 9.5 9.7  PROT 7.1 7.0  ALBUMIN 4.0 3.9  AST 18 18  ALT 25 23  ALKPHOS 81 79  BILITOT 0.40 0.31    Pathology report Diagnosis 1. Breast, lumpectomy, Left - INVASIVE DUCTAL CARCINOMA, GRADE 3, SPANNING 2 CM. - DUCTAL CARCINOMA IN SITU, HIGH GRADE. - RESECTION MARGINS ARE NEGATIVE FOR INVASIVE AND IN SITU CARCINOMA. - LOBULAR NEOPLASIA (ATYPICAL LOBULAR HYPERPLASIA). - SEE ONCOLOGY TABLE. 2. Lymph node, sentinel, biopsy, Left axillary #1 - ONE OF ONE LYMPH NODES NEGATIVE FOR CARCINOMA (0/1). 3.  Lymph node, sentinel, biopsy, Left axillary #2 - MICROMETASTASIS IN ONE OF ONE LYMPH NODE (1/1). 4. Lymph node, sentinel, biopsy, Left axillary #3 - ONE OF ONE LYMPH NODES NEGATIVE FOR CARCINOMA (0/1).  Microscopic Comment 1. BREAST, INVASIVE TUMOR, WITH LYMPH NODES PRESENT Specimen, including laterality and lymph node sampling (sentinel, non-sentinel): Left breast lump and left axillary  sentinel lymph nodes. Procedure: Left breast lumpectomy and left axillary sentinel lymph node biopsies. Histologic type: Invasive ductal carcinoma. Grade: 3 Tubule formation: 3 Nuclear pleomorphism: 3 Mitotic: 2 Tumor size (gross measurement): 2 cm Margins: Invasive, distance to closest margin: 0.2 cm (inferior). In-situ, distance to closest margin: 0.4 cm (superior). If margin positive, focally or broadly: N/A. Lymphovascular invasion: Definitive invasion not identified. Ductal carcinoma in situ: Present. Grade: III Extensive intraductal component: No. Lobular neoplasia: Present (atypical lobular hyperplasia). Tumor focality: Unifocal. Treatment effect: N/A. Extent of tumor: Confined to breast parenchyma. Lymph nodes: Examined: 3 Sentinel 0 Non-sentinel 3 Total Lymph nodes with metastasis: 1 Isolated tumor cells (< 0.2 mm): 0 Micrometastasis: (> 0.2 mm and < 2.0 mm): 1 Macrometastasis: (> 2.0 mm): 0 Extracapsular extension: No. Breast prognostic profile: Performed on biopsy ([id number]). Her2 will be repeated on current specimen. Estrogen receptor: Positive, strong staining intensity. Progesterone receptor: Positive, strong staining intensity. Her 2 neu: Negative. Ki-67: 60%. Non-neoplastic breast: Fibrocystic change. TNM: pT1c, pN75mi  Results: HER2 - NEGATIVE RATIO OF HER2/CEP17 SIGNALS 1.15 AVERAGE HER2 COPY NUMBER PER CELL 3.15  Oncotype Dx RS: 30, predicts 20% 10-y risk of distant recurrence with tamoxifen alone   RADIOGRAPHIC STUDIES: I have personally reviewed the  radiological images as listed and agreed with the findings in the report.  No new scans  ASSESSMENT & PLAN:  54 year old Caucasian female, postmenopausal, presented with palpable left breast mass  1. Left breast invasive ductal carcinoma, [id number], stage IB, grade 3, ER positive, PR positive, HER-2 negative, (+) DCIS -I reviewed her surgical pathology results in great details with patient and her husband. -She is now status post lumpectomy and sentinel lymph node biopsy. Surgical margins were negative. She has early-stage breast cancer, likely acute by complete surgical resection -I discussed the risk of cancer recurrence after her surgery. The Oncotype DX test results were explained to her in details. Her recurrence score is 30, which predicts 20% 10 year risk of distant recurrence with tamoxifen alone. Although it is intermediate risk, her score is very close to high risk range, she is young and fit, I recommend adjuvant chemotherapy to reduce the risk of cancer recurrence. -Giving her early stage disease, I recommend adjuvant docetaxel and Cytoxan, every 3 weeks, for total 4 cycles. -Lab reviewed, adequate for treatment, will start cycle 1 chemotherapy today. Main side effects were reviewed with her again, especially neutropenia fever. -I discussed the role of Neulasta to prevent neutropenia fever. She is pretty healthy and no other comorbidities, we'll hold on Neulasta with first cycle him a will consider Neulasta with subsequent cycle if she has severe neutropenia. -Giving her strong ER/PR positivity, I recommend adjuvant endocrine therapy with aromatase inhibitor for 5-10 years. -She would also need adjuvant breast irradiation for local control, she was seen by radiation oncologist Dr. Valere Dross recently and will start radiation after she completes adjuvant chemo  -Lab reviewed, she is neutropenic with ANC 0.1 today, afebrile and feels well, I reviewed neutropenic fever precaution.   2.  Neutropenia -Secondary chemotherapy. Her ANC 0.1 today, no signs of infection. -Neutropenic fever precaution reviewed with her  3. Diarrhea and Epigastric discomfort  - mild, secondary to chemotherapy, resolved now  - she knows to take Imodium as needed for diarrhea after chemotherapy.   4. Anxiety and depression -stable. She takes xanax as needed.   5. GERD -She is on omeprazole, and takes Tums and night  -we discussed dexa around chemo may exacerbate her acid reflux.  Plan -  She'll return on December 1 for second cycle chemotherapy  All questions were answered. The patient knows to call the clinic with any problems, questions or concerns. I spent 10 minutes counseling the patient face to face. The total time spent in the appointment was 15 minutes and more than 50% was on counseling.     Truitt Merle, MD 12/24/2014 8:45 AM

## 2014-12-27 ENCOUNTER — Other Ambulatory Visit (HOSPITAL_BASED_OUTPATIENT_CLINIC_OR_DEPARTMENT_OTHER): Payer: BLUE CROSS/BLUE SHIELD

## 2014-12-27 ENCOUNTER — Telehealth: Payer: Self-pay | Admitting: *Deleted

## 2014-12-27 ENCOUNTER — Other Ambulatory Visit: Payer: Self-pay | Admitting: *Deleted

## 2014-12-27 ENCOUNTER — Ambulatory Visit (HOSPITAL_BASED_OUTPATIENT_CLINIC_OR_DEPARTMENT_OTHER): Payer: BLUE CROSS/BLUE SHIELD | Admitting: Nurse Practitioner

## 2014-12-27 VITALS — BP 140/80 | HR 60 | Temp 98.7°F | Resp 18 | Ht 61.0 in | Wt 155.7 lb

## 2014-12-27 DIAGNOSIS — K1231 Oral mucositis (ulcerative) due to antineoplastic therapy: Secondary | ICD-10-CM

## 2014-12-27 DIAGNOSIS — T451X5A Adverse effect of antineoplastic and immunosuppressive drugs, initial encounter: Secondary | ICD-10-CM

## 2014-12-27 DIAGNOSIS — D701 Agranulocytosis secondary to cancer chemotherapy: Secondary | ICD-10-CM | POA: Diagnosis not present

## 2014-12-27 DIAGNOSIS — C50412 Malignant neoplasm of upper-outer quadrant of left female breast: Secondary | ICD-10-CM

## 2014-12-27 DIAGNOSIS — R21 Rash and other nonspecific skin eruption: Secondary | ICD-10-CM | POA: Diagnosis not present

## 2014-12-27 LAB — CBC WITH DIFFERENTIAL/PLATELET
BASO%: 0.8 % (ref 0.0–2.0)
BASOS ABS: 0 10*3/uL (ref 0.0–0.1)
EOS%: 1.5 % (ref 0.0–7.0)
Eosinophils Absolute: 0 10*3/uL (ref 0.0–0.5)
HEMATOCRIT: 35.8 % (ref 34.8–46.6)
HGB: 12.1 g/dL (ref 11.6–15.9)
LYMPH#: 1.4 10*3/uL (ref 0.9–3.3)
LYMPH%: 52.8 % — ABNORMAL HIGH (ref 14.0–49.7)
MCH: 30.4 pg (ref 25.1–34.0)
MCHC: 33.8 g/dL (ref 31.5–36.0)
MCV: 89.9 fL (ref 79.5–101.0)
MONO#: 0.8 10*3/uL (ref 0.1–0.9)
MONO%: 29.4 % — AB (ref 0.0–14.0)
NEUT#: 0.4 10*3/uL — CL (ref 1.5–6.5)
NEUT%: 15.5 % — AB (ref 38.4–76.8)
Platelets: 313 10*3/uL (ref 145–400)
RBC: 3.98 10*6/uL (ref 3.70–5.45)
RDW: 12.9 % (ref 11.2–14.5)
WBC: 2.7 10*3/uL — ABNORMAL LOW (ref 3.9–10.3)

## 2014-12-27 LAB — COMPREHENSIVE METABOLIC PANEL (CC13)
ALT: 26 U/L (ref 0–55)
AST: 23 U/L (ref 5–34)
Albumin: 3.7 g/dL (ref 3.5–5.0)
Alkaline Phosphatase: 73 U/L (ref 40–150)
Anion Gap: 9 mEq/L (ref 3–11)
BUN: 8.7 mg/dL (ref 7.0–26.0)
CALCIUM: 9.3 mg/dL (ref 8.4–10.4)
CHLORIDE: 104 meq/L (ref 98–109)
CO2: 26 mEq/L (ref 22–29)
Creatinine: 0.7 mg/dL (ref 0.6–1.1)
EGFR: >90 mL/min/{1.73_m2} (ref >90)
Glucose: 91 mg/dl (ref 70–140)
POTASSIUM: 4.1 meq/L (ref 3.5–5.1)
Sodium: 140 mEq/L (ref 136–145)
Total Bilirubin: 0.31 mg/dL (ref 0.20–1.20)
Total Protein: 6.6 g/dL (ref 6.4–8.3)

## 2014-12-27 LAB — TECHNOLOGIST REVIEW

## 2014-12-27 MED ORDER — MAGIC MOUTHWASH W/LIDOCAINE: 5.0000 mL | Freq: Four times a day (QID) | ORAL | Status: DC | PRN

## 2014-12-27 NOTE — Telephone Encounter (Signed)
Dr. Burr Medico notified.  Spoke with pt and instructed pt to come in today at 1230 for lab and 1 pm to see symptom management provider.  Pt voiced understanding.

## 2014-12-27 NOTE — Telephone Encounter (Signed)
Voicemail: "I'm having pain on the inside of my mouth, under my tongue in the very back,  It's not a mouth sore, it's a lymph node type of pain and my ear hurts.  It's becoming difficult to swallow and chew.  Return number [phone].".

## 2014-12-29 ENCOUNTER — Telehealth: Payer: Self-pay | Admitting: *Deleted

## 2014-12-29 ENCOUNTER — Encounter: Payer: Self-pay | Admitting: Nurse Practitioner

## 2014-12-29 DIAGNOSIS — T451X5A Adverse effect of antineoplastic and immunosuppressive drugs, initial encounter: Secondary | ICD-10-CM

## 2014-12-29 DIAGNOSIS — K1231 Oral mucositis (ulcerative) due to antineoplastic therapy: Secondary | ICD-10-CM | POA: Insufficient documentation

## 2014-12-29 DIAGNOSIS — R21 Rash and other nonspecific skin eruption: Secondary | ICD-10-CM

## 2014-12-29 DIAGNOSIS — D701 Agranulocytosis secondary to cancer chemotherapy: Secondary | ICD-10-CM | POA: Insufficient documentation

## 2014-12-29 HISTORY — DX: Adverse effect of antineoplastic and immunosuppressive drugs, initial encounter: D70.1

## 2014-12-29 HISTORY — DX: Rash and other nonspecific skin eruption: R21

## 2014-12-29 HISTORY — DX: Adverse effect of antineoplastic and immunosuppressive drugs, initial encounter: T45.1X5A

## 2014-12-29 HISTORY — DX: Oral mucositis (ulcerative) due to antineoplastic therapy: K12.31

## 2014-12-29 NOTE — Progress Notes (Signed)
SYMPTOM MANAGEMENT CLINIC   HPI: Shirley Howard 54 y.o. female diagnosed with breast cancer.  Currently, and 1 Taxotere/Cytoxan.  Chemotherapy therapy regimen.  Patient reports pain to the left side of her tongue and an enlarged lymph node to the left upper neck.  Directly below her jaw line.  She denies any actual sore throat or fever/chills.  HPI  ROS  Past Medical History  Diagnosis Date  . Wrist pain   . Sore throat 10/27/2012    10/31/12 [facility]NT (see scanned document) - vocal cord polyp left, bilateral edema, significant postcricoid edema, c/w chronic tobacco use and chronic reflux. - Recheck 2 months - Encouraged tobacco cessation to reduce risk of laryngeal cancer     . Anxiety   . Depression   . Hot flashes   . GERD (gastroesophageal reflux disease)     Past Surgical History  Procedure Laterality Date  . Cyst removal hand    . Tubal ligation  1995  . Breast lumpectomy with axillary lymph node biopsy Left 11/16/2014    Procedure: LEFT BREAST LUMPECTOMY WITH AXILLARY LYMPH NODE BIOPSY;  Surgeon: Stark Klein, MD;  Location: [place];  Service: General;  Laterality: Left;    has HYPERLIPIDEMIA; Ex-heavy cigarette smoker (20-39 per day); MENOPAUSAL SYNDROME; History of depression; Left wrist pain; Poor sleep pattern; Weight gain; Preventative health care; Vocal cord nodule; Pap smear abnormality of cervix with ASCUS favoring benign; Left breast mass; Breast cancer of upper-outer quadrant of left female breast ([place]); Rash of hands; Mucositis due to antineoplastic therapy; and Chemotherapy induced neutropenia (HCC) on her problem list.    is allergic to codeine.    Medication List       This list is accurate as of: 12/27/14 11:59 PM.  Always use your most recent med list.               ALPRAZolam 0.25 MG tablet  Commonly known as:  XANAX  Take 1-2 tablets (0.25-0.5 mg total) by mouth at bedtime as needed for anxiety or sleep.     aspirin 81  MG tablet  Take 81 mg by mouth daily.     calcium carbonate 600 MG Tabs tablet  Commonly known as:  OS-CAL  Take 600 mg by mouth daily with breakfast.     cholecalciferol 1000 UNITS tablet  Commonly known as:  VITAMIN D  Take 2,000 Units by mouth daily.     dexamethasone 4 MG tablet  Commonly known as:  DECADRON  Take 2 tablets (8 mg total) by mouth 2 (two) times daily. Start the day before Taxotere. Then again the day after chemo for 3 days.     diphenhydrAMINE 25 MG tablet  Commonly known as:  SOMINEX  Take 50 mg by mouth at bedtime as needed for sleep.     fish oil-omega-3 fatty acids 1000 MG capsule  Take 2 g by mouth daily.     fluticasone 50 MCG/ACT nasal spray  Commonly known as:  FLONASE  Place 2 sprays into both nostrils daily.     magic mouthwash w/lidocaine Soln  Take 5 mLs by mouth 4 (four) times daily as needed for mouth pain.     multivitamin tablet  Take 1 tablet by mouth daily.     omeprazole 40 MG capsule  Commonly known as:  PRILOSEC  Take one tablet daily.     ondansetron 8 MG tablet  Commonly known as:  ZOFRAN  Take 1 tablet (8 mg total)  by mouth 2 (two) times daily. Start the day after chemo for 3 days. Then take as needed for nausea or vomiting.     oxyCODONE-acetaminophen 5-325 MG tablet  Commonly known as:  PERCOCET/ROXICET     PHILLIPS FIBER GOOD PO  Take by mouth.         PHYSICAL EXAMINATION  Oncology Vitals 12/27/2014 12/24/2014  Height 155 cm 155 cm  Weight 70.625 kg 70.353 kg  Weight (lbs) 155 lbs 11 oz 155 lbs 2 oz  BMI (kg/m2) 29.42 kg/m2 29.31 kg/m2  Temp 98.7 98.2  Pulse 60 77  Resp 18 18  SpO2 99 100  BSA (m2) 1.74 m2 1.74 m2   BP Readings from Last 2 Encounters:  12/27/14 140/80  12/24/14 113/65    Physical Exam  Constitutional: She is oriented to person, place, and time and well-developed, well-nourished, and in no distress.  HENT:  Head: Normocephalic and atraumatic.  Patient has 1 large ulceration to the  left underside of her tongue; with some left upper neck under her jaw line lymph node enlargement as well.  Posterior oropharynx clear with no erythema or exudate.  Eyes: Conjunctivae and EOM are normal. Pupils are equal, round, and reactive to light. Right eye exhibits no discharge. Left eye exhibits no discharge. No scleral icterus.  Neck: Normal range of motion.  Pulmonary/Chest: Effort normal. No respiratory distress.  Musculoskeletal: Normal range of motion. She exhibits no edema or tenderness.  Neurological: She is alert and oriented to person, place, and time. Gait normal.  Skin: Skin is warm and dry. Rash noted. No erythema. No pallor.  Patient has some mild dermatitis to the dorsal side of both hands directly below the thumbs.  Psychiatric: Affect normal.  Nursing note and vitals reviewed.   LABORATORY DATA:. Appointment on 12/27/2014  Component Date Value Ref Range Status  . WBC 12/27/2014 2.7* 3.9 - 10.3 10e3/uL Final  . NEUT# 12/27/2014 0.4* 1.5 - 6.5 10e3/uL Final  . HGB 12/27/2014 12.1  11.6 - 15.9 g/dL Final  . HCT 12/27/2014 35.8  34.8 - 46.6 % Final  . Platelets 12/27/2014 313  145 - 400 10e3/uL Final  . MCV 12/27/2014 89.9  79.5 - 101.0 fL Final  . MCH 12/27/2014 30.4  25.1 - 34.0 pg Final  . MCHC 12/27/2014 33.8  31.5 - 36.0 g/dL Final  . RBC 12/27/2014 3.98  3.70 - 5.45 10e6/uL Final  . RDW 12/27/2014 12.9  11.2 - 14.5 % Final  . lymph# 12/27/2014 1.4  0.9 - 3.3 10e3/uL Final  . MONO# 12/27/2014 0.8  0.1 - 0.9 10e3/uL Final  . Eosinophils Absolute 12/27/2014 0.0  0.0 - 0.5 10e3/uL Final  . Basophils Absolute 12/27/2014 0.0  0.0 - 0.1 10e3/uL Final  . NEUT% 12/27/2014 15.5* 38.4 - 76.8 % Final  . LYMPH% 12/27/2014 52.8* 14.0 - 49.7 % Final  . MONO% 12/27/2014 29.4* 0.0 - 14.0 % Final  . EOS% 12/27/2014 1.5  0.0 - 7.0 % Final  . BASO% 12/27/2014 0.8  0.0 - 2.0 % Final  . Sodium 12/27/2014 140  136 - 145 mEq/L Final  . Potassium 12/27/2014 4.1  3.5 - 5.1 mEq/L  Final  . Chloride 12/27/2014 104  98 - 109 mEq/L Final  . CO2 12/27/2014 26  22 - 29 mEq/L Final  . Glucose 12/27/2014 91  70 - 140 mg/dl Final   Glucose reference range is for nonfasting patients. Fasting glucose reference range is 70- 100.  Marland Kitchen BUN 12/27/2014 8.7  7.0 - 26.0 mg/dL Final  . Creatinine 12/27/2014 0.7  0.6 - 1.1 mg/dL Final  . Total Bilirubin 12/27/2014 0.31  0.20 - 1.20 mg/dL Final  . Alkaline Phosphatase 12/27/2014 73  40 - 150 U/L Final  . AST 12/27/2014 23  5 - 34 U/L Final  . ALT 12/27/2014 26  0 - 55 U/L Final  . Total Protein 12/27/2014 6.6  6.4 - 8.3 g/dL Final  . Albumin 12/27/2014 3.7  3.5 - 5.0 g/dL Final  . Calcium 12/27/2014 9.3  8.4 - 10.4 mg/dL Final  . Anion Gap 12/27/2014 9  3 - 11 mEq/L Final  . EGFR 12/27/2014 >90  >90 ml/min/1.73 m2 Final   eGFR is calculated using the CKD-EPI Creatinine Equation (2009)  . Technologist Review 12/27/2014 occassional meta & myelocyte. few large pltsl.   Final   RADIOGRAPHIC STUDIES: No results found.  ASSESSMENT/PLAN:    Breast cancer of upper-outer quadrant of left female breast [facility]) Patient received cycle one of her Taxotere/Cytoxan chemotherapy on 12/15/2014.  She reports to the [place] today with a very tender lesion to the underside of her tongue; and some left neck lymphadenopathy as a result.  She also has noticed a rash to both of her hands.  She denies any recent fevers or chills.  Labs obtained today revealed a WBC of 2.7, ANC 0.4, hemoglobin 12.1, platelet count 313.  Patient's plans to return on 01/06/2015 for labs, visit, and chemotherapy.  Rash of hands Patient states that she has been washing her hands very often since initiating chemotherapy; has noticed a new onset rash to the dorsal side of both of her hands.  She denies any pruritus to the rash.  On exam.-It does appear the patient has some mild dermatitis to the tops of both of her hands directly below the palms.  There is no evidence of  infection to the rash.  Patient was advised to try Benadryl 25 mg every 6 hours and Pepcid 20 mg every 12 hours to see if this helps with the rash.  Also, patient was given samples of Aquaphor to try at home to see if this helps as well.   Patient was advised to call/return or go directly to the emergency department for any worsening symptoms whatsoever.  Mucositis due to antineoplastic therapy Patient reports pain to the left side of her tongue and an enlarged lymph node to the left upper neck.  Directly below her jaw line.  She denies any actual sore throat or fever/chills.  On exam.-Patient does have a significant mucositis-type lesion to the left underside of her tongue.  She also has some enlarged lymph nodes directly below her jaw line as well.  Patient is neutropenic today with an ANC of 0.4.  Reviewed all findings with Dr.Feng; and decision was made to prescribed Magic mouthwash with lidocaine and also advised patient to continue with both baking soda and salt swish and spit as well.  Will hold on initiating any antibiotics at this time since patient is afebrile at this point.  Patient was advised to call/return or go directly to the emergency department for any worsening symptoms whatsoever.  Chemotherapy induced neutropenia (HCC) Patient is neutropenic today with an ANC of 0.4.  Patient does have one mucositis-type region to the underside of her tongue; with subsequent left upper neck lymph node enlargement.  Otherwise-patient has no further complaints.  Patient is afebrile today.  Reviewed all neutropenia guidelines with both patient and her daughter again today.  Patient was advised to call/return directly to the emergency department for any worsening symptoms whatsoever.  Patient stated understanding of all instructions; and was in agreement with this plan of care. The patient knows to call the clinic with any problems, questions or concerns.   Review/collaboration with Dr.  Burr Medico regarding all aspects of patient's visit today.   Total time spent with patient was 25 minutes;  with greater than 75 percent of that time spent in face to face counseling regarding patient's symptoms,  and coordination of care and follow up.  Disclaimer:This dictation was prepared with Dragon/digital dictation along with [employer]. Any transcriptional errors that result from this process are unintentional.  Drue Second, NP 12/29/2014

## 2014-12-29 NOTE — Assessment & Plan Note (Signed)
Patient is neutropenic today with an ANC of 0.4.  Patient does have one mucositis-type region to the underside of her tongue; with subsequent left upper neck lymph node enlargement.  Otherwise-patient has no further complaints.  Patient is afebrile today.  Reviewed all neutropenia guidelines with both patient and her daughter again today.  Patient was advised to call/return directly to the emergency department for any worsening symptoms whatsoever.

## 2014-12-29 NOTE — Assessment & Plan Note (Signed)
Patient states that she has been washing her hands very often since initiating chemotherapy; has noticed a new onset rash to the dorsal side of both of her hands.  She denies any pruritus to the rash.  On exam.-It does appear the patient has some mild dermatitis to the tops of both of her hands directly below the palms.  There is no evidence of infection to the rash.  Patient was advised to try Benadryl 25 mg every 6 hours and Pepcid 20 mg every 12 hours to see if this helps with the rash.  Also, patient was given samples of Aquaphor to try at home to see if this helps as well.   Patient was advised to call/return or go directly to the emergency department for any worsening symptoms whatsoever.

## 2014-12-29 NOTE — Assessment & Plan Note (Signed)
Patient reports pain to the left side of her tongue and an enlarged lymph node to the left upper neck.  Directly below her jaw line.  She denies any actual sore throat or fever/chills.  On exam.-Patient does have a significant mucositis-type lesion to the left underside of her tongue.  She also has some enlarged lymph nodes directly below her jaw line as well.  Patient is neutropenic today with an ANC of 0.4.  Reviewed all findings with Dr.Feng; and decision was made to prescribed Magic mouthwash with lidocaine and also advised patient to continue with both baking soda and salt swish and spit as well.  Will hold on initiating any antibiotics at this time since patient is afebrile at this point.  Patient was advised to call/return or go directly to the emergency department for any worsening symptoms whatsoever.

## 2014-12-29 NOTE — Assessment & Plan Note (Signed)
Patient received cycle one of her Taxotere/Cytoxan chemotherapy on 12/15/2014.  She reports to the [place] today with a very tender lesion to the underside of her tongue; and some left neck lymphadenopathy as a result.  She also has noticed a rash to both of her hands.  She denies any recent fevers or chills.  Labs obtained today revealed a WBC of 2.7, ANC 0.4, hemoglobin 12.1, platelet count 313.  Patient's plans to return on 01/06/2015 for labs, visit, and chemotherapy.

## 2014-12-29 NOTE — Telephone Encounter (Signed)
TC to pt to follow up on [facility] visit. Pt states she has reduced pain in her mouth and is able to eat with little pain compared to prior to visit. She states she is feeling much better. The rash on her hands has resolved. She confirms appt for next Thursday.

## 2015-01-06 ENCOUNTER — Telehealth: Payer: Self-pay | Admitting: *Deleted

## 2015-01-06 ENCOUNTER — Ambulatory Visit (HOSPITAL_BASED_OUTPATIENT_CLINIC_OR_DEPARTMENT_OTHER): Payer: BLUE CROSS/BLUE SHIELD | Admitting: Hematology

## 2015-01-06 ENCOUNTER — Telehealth: Payer: Self-pay | Admitting: Hematology

## 2015-01-06 ENCOUNTER — Other Ambulatory Visit (HOSPITAL_BASED_OUTPATIENT_CLINIC_OR_DEPARTMENT_OTHER): Payer: BLUE CROSS/BLUE SHIELD

## 2015-01-06 ENCOUNTER — Ambulatory Visit (HOSPITAL_BASED_OUTPATIENT_CLINIC_OR_DEPARTMENT_OTHER): Payer: BLUE CROSS/BLUE SHIELD

## 2015-01-06 ENCOUNTER — Encounter: Payer: Self-pay | Admitting: Hematology

## 2015-01-06 VITALS — BP 134/78 | HR 65 | Temp 97.5°F | Resp 18 | Ht 61.0 in | Wt 158.6 lb

## 2015-01-06 DIAGNOSIS — C50412 Malignant neoplasm of upper-outer quadrant of left female breast: Secondary | ICD-10-CM | POA: Diagnosis not present

## 2015-01-06 DIAGNOSIS — D701 Agranulocytosis secondary to cancer chemotherapy: Secondary | ICD-10-CM | POA: Diagnosis not present

## 2015-01-06 DIAGNOSIS — K219 Gastro-esophageal reflux disease without esophagitis: Secondary | ICD-10-CM | POA: Diagnosis not present

## 2015-01-06 DIAGNOSIS — Z5111 Encounter for antineoplastic chemotherapy: Secondary | ICD-10-CM | POA: Diagnosis not present

## 2015-01-06 DIAGNOSIS — K1231 Oral mucositis (ulcerative) due to antineoplastic therapy: Secondary | ICD-10-CM

## 2015-01-06 DIAGNOSIS — Z170 Estrogen receptor positive status [ER+]: Secondary | ICD-10-CM | POA: Diagnosis not present

## 2015-01-06 DIAGNOSIS — F418 Other specified anxiety disorders: Secondary | ICD-10-CM

## 2015-01-06 DIAGNOSIS — T451X5A Adverse effect of antineoplastic and immunosuppressive drugs, initial encounter: Secondary | ICD-10-CM

## 2015-01-06 HISTORY — DX: Oral mucositis (ulcerative) due to antineoplastic therapy: K12.31

## 2015-01-06 LAB — CBC WITH DIFFERENTIAL/PLATELET
BASO%: 0.4 % (ref 0.0–2.0)
BASOS ABS: 0.1 10*3/uL (ref 0.0–0.1)
EOS ABS: 0 10*3/uL (ref 0.0–0.5)
EOS%: 0 % (ref 0.0–7.0)
HEMATOCRIT: 35.1 % (ref 34.8–46.6)
HGB: 11.8 g/dL (ref 11.6–15.9)
LYMPH#: 1.7 10*3/uL (ref 0.9–3.3)
LYMPH%: 6.4 % — AB (ref 14.0–49.7)
MCH: 30 pg (ref 25.1–34.0)
MCHC: 33.7 g/dL (ref 31.5–36.0)
MCV: 89 fL (ref 79.5–101.0)
MONO#: 1.3 10*3/uL — AB (ref 0.1–0.9)
MONO%: 5 % (ref 0.0–14.0)
NEUT#: 23.8 10*3/uL — ABNORMAL HIGH (ref 1.5–6.5)
NEUT%: 88.2 % — AB (ref 38.4–76.8)
PLATELETS: 373 10*3/uL (ref 145–400)
RBC: 3.94 10*6/uL (ref 3.70–5.45)
RDW: 13.4 % (ref 11.2–14.5)
WBC: 27 10*3/uL — ABNORMAL HIGH (ref 3.9–10.3)

## 2015-01-06 LAB — COMPREHENSIVE METABOLIC PANEL (CC13)
ALT: 39 U/L (ref 0–55)
ANION GAP: 11 meq/L (ref 3–11)
AST: 25 U/L (ref 5–34)
Albumin: 3.9 g/dL (ref 3.5–5.0)
Alkaline Phosphatase: 79 U/L (ref 40–150)
BUN: 21.1 mg/dL (ref 7.0–26.0)
CALCIUM: 9.9 mg/dL (ref 8.4–10.4)
CHLORIDE: 100 meq/L (ref 98–109)
CO2: 24 meq/L (ref 22–29)
Creatinine: 0.8 mg/dL (ref 0.6–1.1)
EGFR: 85 mL/min/{1.73_m2} — AB (ref >90)
Glucose: 116 mg/dl (ref 70–140)
POTASSIUM: 3.9 meq/L (ref 3.5–5.1)
Sodium: 135 mEq/L — ABNORMAL LOW (ref 136–145)
Total Bilirubin: <0.3 mg/dL (ref 0.20–1.20)
Total Protein: 7.3 g/dL (ref 6.4–8.3)

## 2015-01-06 MED ORDER — DOCETAXEL CHEMO INJECTION 160 MG/16ML
75.0000 mg/m2 | Freq: Once | INTRAVENOUS | Status: AC
  Administered 2015-01-06: 130 mg via INTRAVENOUS
  Filled 2015-01-06: qty 13

## 2015-01-06 MED ORDER — SODIUM CHLORIDE 0.9 % IV SOLN
Freq: Once | INTRAVENOUS | Status: AC
  Administered 2015-01-06: 14:00:00 via INTRAVENOUS

## 2015-01-06 MED ORDER — SODIUM CHLORIDE 0.9 % IV SOLN
Freq: Once | INTRAVENOUS | Status: AC
  Administered 2015-01-06: 14:00:00 via INTRAVENOUS
  Filled 2015-01-06: qty 8

## 2015-01-06 MED ORDER — SODIUM CHLORIDE 0.9 % IV SOLN
600.0000 mg/m2 | Freq: Once | INTRAVENOUS | Status: AC
  Administered 2015-01-06: 1060 mg via INTRAVENOUS
  Filled 2015-01-06: qty 53

## 2015-01-06 MED ORDER — DEXAMETHASONE 4 MG PO TABS: 8.0000 mg | ORAL_TABLET | Freq: Two times a day (BID) | ORAL | Status: DC

## 2015-01-06 NOTE — Progress Notes (Signed)
[facility]  [phone] [phone]  Clinic follow up Note   Patient Care Team: Kyle J Fletke, MD as PCP - General (Family Medicine) Faera Byerly, MD as Consulting Physician (General Surgery) Yan Feng, MD as Consulting Physician (Hematology) Robert Murray, MD as Consulting Physician (Radiation Oncology) Keisha N Martini, RN as Registered Nurse Dawn C Stuart, RN as Registered Nurse Heather Thompson Mackey, NP as Nurse Practitioner (Nurse Practitioner) 01/06/2015  CHIEF COMPLAINTS:  Follow up left breast cancer  Oncology History   Breast cancer of upper-outer quadrant of left female breast (HCC)   Staging form: Breast, AJCC 7th Edition     Clinical stage from 11/10/2014: Stage IIA (T2, N0, M0) - Signed by Yan Feng, MD on 11/10/2014     Pathologic stage from 11/18/2014: Stage IB (T1c, N1mi, cM0) - Signed by Joshua Kish, MD on 12/01/2014       Staging comments: Staged on lumpectomy specimen by Dr. Manny          Breast cancer of upper-outer quadrant of left female breast (HCC)   10/28/2014 Mammogram Screening and the diagnostic mammogram shows a 2.1 x 1.3 x 2.1 cm it irregular mass in the left breast 2:00 position, 2 cm from the nipple.   11/01/2014 Receptors her2 ER 100%, PR100%+, HER2-, KI67 60%    11/01/2014 Initial Biopsy left breast biopsy showed invasive ductal carcinoma, grade 2, and DCIS    11/04/2014 Initial Diagnosis Breast cancer of upper-outer quadrant of left female breast (HCC)   11/16/2014 Oncotype testing RS 30, predicts 20% 10-y risk of distant recurrence with tamoxifen alone    11/16/2014 Surgery left breast lumpectomy iwth SLN biopsy.    11/16/2014 Pathology Results left breast invasive ductal carcinoma, G3, 1 out of 3 SLN had micrometastasis, (-)LVI, margines negative,  (+) high grade DCIS   12/15/2014 -  Adjuvant Chemotherapy Docetaxel 75 mg/m, Cytoxan 600 mg/m, every 3 weeks, planning for total 4 cycles.    HISTORY OF  PRESENTING ILLNESS:  Shirley Howard 54 y.o. female is here because of her recently diagnosed left breast cancer. She is accompanied by her husband, daughter, mother to our multidisciplinary breast clinic today.  She felt a lump in her left breast one  Month ago, mild tender, she otherwise feels well no other new symptoms. This was developed by her primary care physician and screening mammogram was obtained. Mammogram reviewed a 2.1 cm mass in the left breast 2:00 position, core needle biopsy showed invasive ductal carcinoma, and DCIS.  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 She feels well overall. She does have mild low back pain, does not take any education. She is physically active at home, but does not exercise regularly. No family history of breast cancer. She was on hormone replacement for the past 8 years, and just stopped progesterone a few weeks ago.  CURRENT THERAPY: Docetaxel 75 mg/m, Cytoxan 600 mg/m, every 3 weeks, started on 12/15/2014  INTERIM HISTORY  [name] returns for follow up and second cycle chemotherapy. She developed a mouth sore after I saw her last time, was seen at our symptom management clinic. She used magic mouthwash, and resolved afterwards. She is otherwise doing very well, her appetite and and energy level are very good, she denies any nausea, diarrhea, pain or any other symptoms. Her weight is stable.  MEDICAL HISTORY:  Past Medical History  Diagnosis Date  . Wrist pain   . Sore throat 10/27/2012    10/31/12   [facility] (see scanned document) - vocal cord polyp left, bilateral edema, significant postcricoid edema, c/w chronic tobacco use and chronic reflux. - Recheck 2 months - Encouraged tobacco cessation to reduce risk of laryngeal cancer     . Anxiety   . Depression   . Hot flashes   . GERD (gastroesophageal reflux disease)     SURGICAL HISTORY: Past Surgical History  Procedure Laterality Date  . Cyst removal hand    .  Tubal ligation  1995  . Breast lumpectomy with axillary lymph node biopsy Left 11/16/2014    Procedure: LEFT BREAST LUMPECTOMY WITH AXILLARY LYMPH NODE BIOPSY;  Surgeon: Faera Byerly, MD;  Location: [facility];  Service: General;  Laterality: Left;   GYN HISTORY  Menarchal: 10 LMP: 48 Contraceptive: 16 HRT: 8 years, stopped in 10/2014  G2P2:    SOCIAL HISTORY: Social History   Social History  . Marital Status: Married    Spouse Name: N/A  . Number of Children: N/A  . Years of Education: N/A   Occupational History  . Not on file.   Social History Main Topics  . Smoking status: Former Smoker -- 1.50 packs/day for 40 years    Types: Cigarettes    Quit date: 11/04/2012  . Smokeless tobacco: Never Used  . Alcohol Use: Yes     Comment: social users   . Drug Use: No  . Sexual Activity:    Partners: Male     Comment: Husband   Other Topics Concern  . Not on file   Social History Narrative   Lives with husband in [place].   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 Supraventricular tachycardia Mother   . Thyroid disease Mother 76    unsure what type   . Cancer Father     throat cancer - smoker  . Stroke Father     ALLERGIES:  is allergic to codeine.  MEDICATIONS:  Current Outpatient Prescriptions  Medication Sig Dispense Refill  . ALPRAZolam (XANAX) 0.25 MG tablet Take 1-2 tablets (0.25-0.5 mg total) by mouth at bedtime as needed for anxiety or sleep. 50 tablet 0  . aspirin 81 MG tablet Take 81 mg by mouth daily.    . calcium carbonate (OS-CAL) 600 MG TABS tablet Take 600 mg by mouth daily with breakfast.    . cholecalciferol (VITAMIN D) 1000 UNITS tablet Take 2,000 Units by mouth daily.    . dexamethasone (DECADRON) 4 MG tablet Take 2 tablets (8 mg total) by mouth 2 (two)  times daily. Start the day before Taxotere. Then again the day after chemo for 3 days. 35 tablet 0  . diphenhydrAMINE (SOMINEX) 25 MG tablet Take 50 mg by mouth at bedtime as needed for sleep.     . fish oil-omega-3 fatty acids 1000 MG capsule Take 2 g by mouth daily.    . fluticasone (FLONASE) 50 MCG/ACT nasal spray Place 2 sprays into both nostrils daily. 16 g 0  . Inulin (PHILLIPS FIBER GOOD PO) Take by mouth.    . L-Glutamine 500 MG TABS Take 1 tablet by mouth 4 (four) times daily.    . magic mouthwash w/lidocaine SOLN Take 5 mLs by mouth 4 (four) times daily as needed for mouth pain. 240 mL   1  . Multiple Vitamin (MULTIVITAMIN) tablet Take 1 tablet by mouth daily.    Marland Kitchen omeprazole (PRILOSEC) 40 MG capsule Take one tablet daily.    . ondansetron (ZOFRAN) 8 MG tablet Take 1 tablet (8 mg total) by mouth 2 (two) times daily. Start the day after chemo for 3 days. Then take as needed for nausea or vomiting. 30 tablet 1  . oxyCODONE-acetaminophen (PERCOCET/ROXICET) 5-325 MG tablet   0   No current facility-administered medications for this visit.    REVIEW OF SYSTEMS:   Constitutional: Denies fevers, chills or abnormal night sweats Eyes: Denies blurriness of vision, double vision or watery eyes Ears, nose, mouth, throat, and face: Denies mucositis or sore throat Respiratory: Denies cough, dyspnea or wheezes Cardiovascular: Denies palpitation, chest discomfort or lower extremity swelling Gastrointestinal:  Denies nausea, heartburn or change in bowel habits Skin: Denies abnormal skin rashes Lymphatics: Denies new lymphadenopathy or easy bruising Neurological:Denies numbness, tingling or new weaknesses Behavioral/Psych: Mood is stable, no new changes  All other systems were reviewed with the patient and are negative.  PHYSICAL EXAMINATION: ECOG PERFORMANCE STATUS: 0 - Asymptomatic  Filed Vitals:   01/06/15 1231  BP: 134/78  Pulse: 65  Temp: 97.5 F (36.4 C)  Resp: 18   Filed Weights     01/06/15 1231  Weight: 158 lb 9.6 oz (71.94 kg)    GENERAL:alert, no distress and comfortable SKIN: skin color, texture, turgor are normal, no rashes or significant lesions EYES: normal, conjunctiva are pink and non-injected, sclera clear OROPHARYNX:no exudate, no erythema and lips, buccal mucosa, and tongue normal  NECK: supple, thyroid normal size, non-tender, without nodularity LYMPH:  no palpable lymphadenopathy in the cervical, axillary or inguinal LUNGS: clear to auscultation and percussion with normal breathing effort HEART: regular rate & rhythm and no murmurs and no lower extremity edema ABDOMEN:abdomen soft, non-tender and normal bowel sounds Musculoskeletal:no cyanosis of digits and no clubbing  PSYCH: alert & oriented x 3 with fluent speech NEURO: no focal motor/sensory deficits Breasts: Breast inspection showed them to be symmetrical with no nipple discharge. The left breast incision is healing well, clean, no surrounding skin erythema. There is fullness underneath the surgical incision, likely surgical scar or seroma. Palpation of the right breast and bilateral axilla with showed no palpable mass or adenopathy.  LABORATORY DATA:  I have reviewed the data as listed Lab Results  Component Value Date   WBC 27.0* 01/06/2015   HGB 11.8 01/06/2015   HCT 35.1 01/06/2015   MCV 89.0 01/06/2015   PLT 373 01/06/2015    Recent Labs  12/24/14 0813 12/27/14 1243 01/06/15 1211  NA 139 140 135*  K 4.7 4.1 3.9  CO2 _0 GLUCOSE 97 91 116  BUN 10.7 8.7 21.1  CREATININE 0.8 0.7 0.8  CALCIUM 9.4 9.3 9.9  PROT 6.4 6.6 7.3  ALBUMIN 3.6 3.7 3.9  AST _1 ALT 24 26 39  ALKPHOS 79 73 79  BILITOT 0.35 0.31 <0.30    Pathology report Diagnosis 1. Breast, lumpectomy, Left - INVASIVE DUCTAL CARCINOMA, GRADE 3, SPANNING 2 CM. - DUCTAL CARCINOMA IN SITU, HIGH GRADE. - RESECTION MARGINS ARE NEGATIVE FOR INVASIVE AND IN SITU CARCINOMA. - LOBULAR NEOPLASIA (ATYPICAL  LOBULAR HYPERPLASIA). - SEE ONCOLOGY TABLE. 2. Lymph node, sentinel, biopsy, Left axillary #1 - ONE OF ONE LYMPH NODES NEGATIVE FOR CARCINOMA (0/1). 3. Lymph node, sentinel, biopsy, Left axillary #2 - MICROMETASTASIS IN ONE OF ONE LYMPH NODE (1/1). 4. Lymph  node, sentinel, biopsy, Left axillary #3 - ONE OF ONE LYMPH NODES NEGATIVE FOR CARCINOMA (0/1).  Microscopic Comment 1. BREAST, INVASIVE TUMOR, WITH LYMPH NODES PRESENT Specimen, including laterality and lymph node sampling (sentinel, non-sentinel): Left breast lump and left axillary sentinel lymph nodes. Procedure: Left breast lumpectomy and left axillary sentinel lymph node biopsies. Histologic type: Invasive ductal carcinoma. Grade: 3 Tubule formation: 3 Nuclear pleomorphism: 3 Mitotic: 2 Tumor size (gross measurement): 2 cm Margins: Invasive, distance to closest margin: 0.2 cm (inferior). In-situ, distance to closest margin: 0.4 cm (superior). If margin positive, focally or broadly: N/A. Lymphovascular invasion: Definitive invasion not identified. Ductal carcinoma in situ: Present. Grade: III Extensive intraductal component: No. Lobular neoplasia: Present (atypical lobular hyperplasia). Tumor focality: Unifocal. Treatment effect: N/A. Extent of tumor: Confined to breast parenchyma. Lymph nodes: Examined: 3 Sentinel 0 Non-sentinel 3 Total Lymph nodes with metastasis: 1 Isolated tumor cells (< 0.2 mm): 0 Micrometastasis: (> 0.2 mm and < 2.0 mm): 1 Macrometastasis: (> 2.0 mm): 0 Extracapsular extension: No. Breast prognostic profile: Performed on biopsy ([id number]). Her2 will be repeated on current specimen. Estrogen receptor: Positive, strong staining intensity. Progesterone receptor: Positive, strong staining intensity. Her 2 neu: Negative. Ki-67: 60%. Non-neoplastic breast: Fibrocystic change. TNM: pT1c, pN1mi  Results: HER2 - NEGATIVE RATIO OF HER2/CEP17 SIGNALS 1.15 AVERAGE HER2 COPY NUMBER PER CELL  3.15  Oncotype Dx RS: 30, predicts 20% 10-y risk of distant recurrence with tamoxifen alone   RADIOGRAPHIC STUDIES: I have personally reviewed the radiological images as listed and agreed with the findings in the report.  No new scans  ASSESSMENT & PLAN:  54-year-old Caucasian female, postmenopausal, presented with palpable left breast mass  1. Left breast invasive ductal carcinoma, [id number], stage IB, grade 3, ER positive, PR positive, HER-2 negative, (+) DCIS -I previously reviewed her surgical pathology results in great details with patient and her husband. -She is now status post lumpectomy and sentinel lymph node biopsy. Surgical margins were negative. She has early-stage breast cancer, likely acute by complete surgical resection -I discussed the risk of cancer recurrence after her surgery. The Oncotype DX test results were explained to her in details. Her recurrence score is 30, which predicts 20% 10 year risk of distant recurrence with tamoxifen alone. Although it is intermediate risk, her score is very close to high risk range, she is young and fit, I recommend adjuvant chemotherapy to reduce the risk of cancer recurrence. -Giving her early stage disease, I recommend adjuvant docetaxel and Cytoxan, every 3 weeks, for total 4 cycles. -Lab reviewed, adequate for treatment, well proceed second cycle today.  -Her white count has recovered well well, no Neulasta for second cycle also -Giving her strong ER/PR positivity, I recommend adjuvant endocrine therapy with aromatase inhibitor for 5-10 years. -She would also need adjuvant breast irradiation for local control, she was seen by radiation oncologist Dr. Murray recently and will start radiation after she completes adjuvant chemo   2. Neutropenia -Secondary chemotherapy. Nadir ANC 0.1, recovered well well, no fever -Neutropenic fever precaution reviewed with her  3. Diarrhea,  Epigastric discomfort and mucositis  - mild, secondary to  chemotherapy, resolved now  - she knows to take Imodium as needed for diarrhea, and he used Magic mouthwash after chemotherapy.   4. Anxiety and depression -stable. She takes xanax as needed.   5. GERD -She is on omeprazole, and takes Tums and night  -we discussed dexa around chemo may exacerbate her acid reflux.  Plan -Second cycle of docetaxel and   Cytoxan today, no Neulasta -She will return in 3 weeks for cycle 3  All questions were answered. The patient knows to call the clinic with any problems, questions or concerns. I spent 20 minutes counseling the patient face to face. The total time spent in the appointment was 25 minutes and more than 50% was on counseling.     Feng, Yan, MD 01/06/2015 1:23 PM 

## 2015-01-06 NOTE — Patient Instructions (Signed)
[facility] Discharge Instructions for Patients Receiving Chemotherapy  Today you received the following chemotherapy agents: Taxotere and Cytoxan.  To help prevent nausea and vomiting after your treatment, we encourage you to take your nausea medication: Dexamethasone. Take one twice daily beginning the morning of 01/07/15. Take Zofran every 8 hours as needed.   If you develop nausea and vomiting that is not controlled by your nausea medication, call the clinic.   BELOW ARE SYMPTOMS THAT SHOULD BE REPORTED IMMEDIATELY:  *FEVER GREATER THAN 100.5 F  *CHILLS WITH OR WITHOUT FEVER  NAUSEA AND VOMITING THAT IS NOT CONTROLLED WITH YOUR NAUSEA MEDICATION  *UNUSUAL SHORTNESS OF BREATH  *UNUSUAL BRUISING OR BLEEDING  TENDERNESS IN MOUTH AND THROAT WITH OR WITHOUT PRESENCE OF ULCERS  *URINARY PROBLEMS  *BOWEL PROBLEMS  UNUSUAL RASH Items with * indicate a potential emergency and should be followed up as soon as possible.  Feel free to call the clinic should you have any questions or concerns. The clinic phone number is [phone].  Please show the [place] at check-in to the Emergency Department and triage nurse.

## 2015-01-06 NOTE — Telephone Encounter (Signed)
perp of to sch pt appt-sent MW email to sch pt trmt-pt wants to get updated on MY CHART

## 2015-01-06 NOTE — Telephone Encounter (Signed)
Per staff message and POF I have scheduled appts. Advised scheduler of appts. JMW  

## 2015-01-07 ENCOUNTER — Ambulatory Visit: Payer: BLUE CROSS/BLUE SHIELD

## 2015-01-11 ENCOUNTER — Telehealth: Payer: Self-pay | Admitting: Hematology

## 2015-01-11 NOTE — Telephone Encounter (Signed)
pt cld to state inf was scheduled wrong-gave pt time & date of inf-scheduled correctly

## 2015-01-21 ENCOUNTER — Other Ambulatory Visit: Payer: Self-pay | Admitting: Hematology

## 2015-01-21 MED ORDER — ALPRAZOLAM 0.25 MG PO TABS: 0.2500 mg | ORAL_TABLET | Freq: Every evening | ORAL | Status: DC | PRN

## 2015-01-26 ENCOUNTER — Other Ambulatory Visit (HOSPITAL_BASED_OUTPATIENT_CLINIC_OR_DEPARTMENT_OTHER): Payer: BLUE CROSS/BLUE SHIELD

## 2015-01-26 ENCOUNTER — Ambulatory Visit (HOSPITAL_BASED_OUTPATIENT_CLINIC_OR_DEPARTMENT_OTHER): Payer: BLUE CROSS/BLUE SHIELD | Admitting: Hematology

## 2015-01-26 ENCOUNTER — Encounter: Payer: Self-pay | Admitting: Hematology

## 2015-01-26 ENCOUNTER — Ambulatory Visit (HOSPITAL_BASED_OUTPATIENT_CLINIC_OR_DEPARTMENT_OTHER): Payer: BLUE CROSS/BLUE SHIELD

## 2015-01-26 VITALS — BP 130/65 | HR 79 | Temp 97.7°F | Resp 18 | Ht 61.0 in | Wt 161.5 lb

## 2015-01-26 DIAGNOSIS — D72829 Elevated white blood cell count, unspecified: Secondary | ICD-10-CM

## 2015-01-26 DIAGNOSIS — Z170 Estrogen receptor positive status [ER+]: Secondary | ICD-10-CM

## 2015-01-26 DIAGNOSIS — D6481 Anemia due to antineoplastic chemotherapy: Secondary | ICD-10-CM | POA: Diagnosis not present

## 2015-01-26 DIAGNOSIS — C50412 Malignant neoplasm of upper-outer quadrant of left female breast: Secondary | ICD-10-CM | POA: Diagnosis not present

## 2015-01-26 DIAGNOSIS — F418 Other specified anxiety disorders: Secondary | ICD-10-CM

## 2015-01-26 DIAGNOSIS — K1231 Oral mucositis (ulcerative) due to antineoplastic therapy: Secondary | ICD-10-CM

## 2015-01-26 DIAGNOSIS — K219 Gastro-esophageal reflux disease without esophagitis: Secondary | ICD-10-CM

## 2015-01-26 DIAGNOSIS — Z5111 Encounter for antineoplastic chemotherapy: Secondary | ICD-10-CM | POA: Diagnosis not present

## 2015-01-26 LAB — CBC WITH DIFFERENTIAL/PLATELET
BASO%: 0.3 % (ref 0.0–2.0)
BASOS ABS: 0.1 10*3/uL (ref 0.0–0.1)
EOS ABS: 0 10*3/uL (ref 0.0–0.5)
EOS%: 0 % (ref 0.0–7.0)
HCT: 35.2 % (ref 34.8–46.6)
HGB: 11.5 g/dL — ABNORMAL LOW (ref 11.6–15.9)
LYMPH%: 4.5 % — AB (ref 14.0–49.7)
MCH: 29.6 pg (ref 25.1–34.0)
MCHC: 32.7 g/dL (ref 31.5–36.0)
MCV: 90.5 fL (ref 79.5–101.0)
MONO#: 1 10*3/uL — AB (ref 0.1–0.9)
MONO%: 3.5 % (ref 0.0–14.0)
NEUT#: 26.8 10*3/uL — ABNORMAL HIGH (ref 1.5–6.5)
NEUT%: 91.7 % — AB (ref 38.4–76.8)
PLATELETS: 310 10*3/uL (ref 145–400)
RBC: 3.88 10*6/uL (ref 3.70–5.45)
RDW: 14.6 % — ABNORMAL HIGH (ref 11.2–14.5)
WBC: 29.2 10*3/uL — ABNORMAL HIGH (ref 3.9–10.3)
lymph#: 1.3 10*3/uL (ref 0.9–3.3)

## 2015-01-26 LAB — COMPREHENSIVE METABOLIC PANEL
ALT: 30 U/L (ref 0–55)
ANION GAP: 12 meq/L — AB (ref 3–11)
AST: 19 U/L (ref 5–34)
Albumin: 3.6 g/dL (ref 3.5–5.0)
Alkaline Phosphatase: 90 U/L (ref 40–150)
BUN: 20.6 mg/dL (ref 7.0–26.0)
CHLORIDE: 103 meq/L (ref 98–109)
CO2: 23 meq/L (ref 22–29)
Calcium: 9.6 mg/dL (ref 8.4–10.4)
Creatinine: 0.8 mg/dL (ref 0.6–1.1)
EGFR: 88 mL/min/{1.73_m2} — AB (ref >90)
GLUCOSE: 140 mg/dL (ref 70–140)
POTASSIUM: 3.8 meq/L (ref 3.5–5.1)
Sodium: 138 mEq/L (ref 136–145)
Total Bilirubin: <0.3 mg/dL (ref 0.20–1.20)
Total Protein: 7.1 g/dL (ref 6.4–8.3)

## 2015-01-26 LAB — TECHNOLOGIST REVIEW

## 2015-01-26 MED ORDER — SODIUM CHLORIDE 0.9 % IV SOLN
Freq: Once | INTRAVENOUS | Status: AC
  Administered 2015-01-26: 10:00:00 via INTRAVENOUS

## 2015-01-26 MED ORDER — DOCETAXEL CHEMO INJECTION 160 MG/16ML
75.0000 mg/m2 | Freq: Once | INTRAVENOUS | Status: AC
  Administered 2015-01-26: 130 mg via INTRAVENOUS
  Filled 2015-01-26: qty 13

## 2015-01-26 MED ORDER — SODIUM CHLORIDE 0.9 % IV SOLN
600.0000 mg/m2 | Freq: Once | INTRAVENOUS | Status: AC
  Administered 2015-01-26: 1060 mg via INTRAVENOUS
  Filled 2015-01-26: qty 53

## 2015-01-26 MED ORDER — SODIUM CHLORIDE 0.9 % IV SOLN
Freq: Once | INTRAVENOUS | Status: AC
  Administered 2015-01-26: 10:00:00 via INTRAVENOUS
  Filled 2015-01-26: qty 8

## 2015-01-26 MED ORDER — SODIUM CHLORIDE 0.9 % IJ SOLN
10.0000 mL | INTRAMUSCULAR | Status: DC | PRN
  Filled 2015-01-26: qty 10

## 2015-01-26 MED ORDER — HEPARIN SOD (PORK) LOCK FLUSH 100 UNIT/ML IV SOLN
500.0000 [IU] | Freq: Once | INTRAVENOUS | Status: DC | PRN
  Filled 2015-01-26: qty 5

## 2015-01-26 NOTE — Progress Notes (Signed)
Shirley Howard  [phone] [phone]  Clinic follow up Note   Patient Care Team: Lupita Dawn, MD as PCP - General (Family Medicine) Stark Klein, MD as Consulting Physician (General Surgery) Truitt Merle, MD as Consulting Physician (Hematology) Arloa Koh, MD as Consulting Physician (Radiation Oncology) Rockwell Germany, RN as Registered Nurse Mauro Kaufmann, RN as Registered Nurse Sylvan Cheese, NP as Nurse Practitioner (Nurse Practitioner) 01/26/2015  CHIEF COMPLAINTS:  Follow up left breast cancer  Oncology History   Breast cancer of upper-outer quadrant of left female breast [facility])   Staging form: Breast, AJCC 7th Edition     Clinical stage from 11/10/2014: Stage IIA (T2, N0, M0) - Signed by Truitt Merle, MD on 11/10/2014     Pathologic stage from 11/18/2014: Stage IB (T1c, N32m, cM0) - Signed by JEnid Cutter MD on 12/01/2014       Staging comments: Staged on lumpectomy specimen by Dr. MTresa Moore         Breast cancer of upper-outer quadrant of left female breast ([facility]   10/28/2014 Mammogram Screening and the diagnostic mammogram shows a 2.1 x 1.3 x 2.1 cm it irregular mass in the left breast 2:00 position, 2 cm from the nipple.   11/01/2014 Receptors her2 ER 100%, PR100%+, HER2-, KI67 60%    11/01/2014 Initial Biopsy left breast biopsy showed invasive ductal carcinoma, grade 2, and DCIS    11/04/2014 Initial Diagnosis Breast cancer of upper-outer quadrant of left female breast ([facility]   11/16/2014 Oncotype testing RS 30, predicts 20% 10-y risk of distant recurrence with tamoxifen alone    11/16/2014 Surgery left breast lumpectomy iwth SLN biopsy.    11/16/2014 Pathology Results left breast invasive ductal carcinoma, G3, 1 out of 3 SLN had micrometastasis, (-)LVI, margines negative,  (+) high grade DCIS   12/15/2014 -  Adjuvant Chemotherapy Docetaxel 75 mg/m, Cytoxan 600 mg/m, every 3 weeks, planning for total 4 cycles.    HISTORY OF  PRESENTING ILLNESS:  Shirley Howard 54y.o. female is here because of her recently diagnosed left breast cancer. She is accompanied by her husband, daughter, mother to our multidisciplinary breast clinic today.  She felt a lump in her left breast one  Month ago, mild tender, she otherwise feels well no other new symptoms. This was developed by her primary care physician and screening mammogram was obtained. Mammogram reviewed a 2.1 cm mass in the left breast 2:00 position, core needle biopsy showed invasive ductal carcinoma, and DCIS.  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 She feels well overall. She does have mild low back pain, does not take any education. She is physically active at home, but does not exercise regularly. No family history of breast cancer. She was on hormone replacement for the past 8 years, and just stopped progesterone a few weeks ago.  CURRENT THERAPY: Docetaxel 75 mg/m, Cytoxan 600 mg/m, every 3 weeks, started on 12/15/2014  INTERIM HISTORY  Shirley Howard returns for follow up and third cycle chemotherapy. She developed skin rash on her scalp and hands a week after her last cycle chemotherapy, slightly itchy, which has nearly resolved now. She otherwise tolerated chemotherapy very well, no significant nausea, diarrhea, or other complaints.  MEDICAL HISTORY:  Past Medical History  Diagnosis Date  . Wrist pain   . Sore throat 10/27/2012    [facility] vocal cord polyp left, bilateral edema, significant postcricoid edema, c/w chronic tobacco  use and chronic reflux. - Recheck 2 months - Encouraged tobacco cessation to reduce risk of laryngeal cancer     . Anxiety   . Depression   . Hot flashes   . GERD (gastroesophageal reflux disease)     SURGICAL HISTORY: Past Surgical History  Procedure Laterality Date  . Cyst removal hand    . Tubal ligation  1995  . Breast lumpectomy with axillary lymph node biopsy Left  11/16/2014    Procedure: LEFT BREAST LUMPECTOMY WITH AXILLARY LYMPH NODE BIOPSY;  Surgeon: Stark Klein, MD;  Location: [facility];  Service: General;  Laterality: Left;   GYN HISTORY  Menarchal: 10 LMP: 48 Contraceptive: 16 HRT: 8 years, stopped in 10/2014  G2P2:    SOCIAL HISTORY: Social History   Social History  . Marital Status: Married    Spouse Name: N/A  . Number of Children: N/A  . Years of Education: N/A   Occupational History  . Not on file.   Social History Main Topics  . Smoking status: Former Smoker -- 1.50 packs/day for 40 years    Types: Cigarettes    Quit date: 11/04/2012  . Smokeless tobacco: Never Used  . Alcohol Use: Yes     Comment: social users   . Drug Use: No  . Sexual Activity:    Partners: Male     Comment: Husband   Other Topics Concern  . Not on file   Social History Narrative   Lives with husband in [place].   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 Supraventricular tachycardia Mother   . Thyroid disease Mother 70    unsure what type   . Cancer Father     throat cancer - smoker  . Stroke Father     ALLERGIES:  is allergic to codeine.  MEDICATIONS:  Current Outpatient Prescriptions  Medication Sig Dispense Refill  . ALPRAZolam (XANAX) 0.25 MG tablet Take 1-2 tablets (0.25-0.5 mg total) by mouth at bedtime as needed for anxiety or sleep. 50 tablet 0  . aspirin 81 MG tablet Take 81 mg by mouth daily.    . calcium carbonate (OS-CAL) 600 MG TABS tablet Take 600 mg by mouth daily with breakfast.    . cholecalciferol (VITAMIN D) 1000 UNITS tablet Take 2,000 Units by mouth daily.    Marland Kitchen dexamethasone (DECADRON) 4 MG tablet Take 2 tablets (8 mg total) by mouth 2 (two) times daily. Start the day before Taxotere. Then again the day after chemo for 3  days. 35 tablet 0  . diphenhydrAMINE (SOMINEX) 25 MG tablet Take 50 mg by mouth at bedtime as needed for sleep.     . fish oil-omega-3 fatty acids 1000 MG capsule Take 2 g by mouth daily.    . fluticasone (FLONASE) 50 MCG/ACT nasal spray Place 2 sprays into both nostrils daily. 16 g 0  . Inulin (PHILLIPS FIBER GOOD PO) Take by mouth.    . L-Glutamine 500 MG TABS Take 1 tablet by mouth 4 (four) times daily.    . magic mouthwash w/lidocaine SOLN Take 5 mLs by mouth 4 (four) times daily as needed for mouth pain. 240 mL 1  . Multiple Vitamin (MULTIVITAMIN) tablet Take 1 tablet by mouth daily.    Marland Kitchen omeprazole (  PRILOSEC) 40 MG capsule Take one tablet daily.    . ondansetron (ZOFRAN) 8 MG tablet Take 1 tablet (8 mg total) by mouth 2 (two) times daily. Start the day after chemo for 3 days. Then take as needed for nausea or vomiting. 30 tablet 1  . oxyCODONE-acetaminophen (PERCOCET/ROXICET) 5-325 MG tablet   0   No current facility-administered medications for this visit.    REVIEW OF SYSTEMS:   Constitutional: Denies fevers, chills or abnormal night sweats Eyes: Denies blurriness of vision, double vision or watery eyes Ears, nose, mouth, throat, and face: Denies mucositis or sore throat Respiratory: Denies cough, dyspnea or wheezes Cardiovascular: Denies palpitation, chest discomfort or lower extremity swelling Gastrointestinal:  Denies nausea, heartburn or change in bowel habits Skin: Denies abnormal skin rashes Lymphatics: Denies new lymphadenopathy or easy bruising Neurological:Denies numbness, tingling or new weaknesses Behavioral/Psych: Mood is stable, no new changes  All other systems were reviewed with the patient and are negative.  PHYSICAL EXAMINATION: ECOG PERFORMANCE STATUS: 0 - Asymptomatic  Filed Vitals:   01/26/15 0906  BP: 130/65  Pulse: 79  Temp: 97.7 F (36.5 C)  Resp: 18   Filed Weights   01/26/15 0906  Weight: 161 lb 8 oz (73.256 kg)    GENERAL:alert, no  distress and comfortable SKIN: skin color, texture, turgor are normal, there are a few scattered small skin rash on her scalp EYES: normal, conjunctiva are pink and non-injected, sclera clear OROPHARYNX:no exudate, no erythema and lips, buccal mucosa, and tongue normal  NECK: supple, thyroid normal size, non-tender, without nodularity LYMPH:  no palpable lymphadenopathy in the cervical, axillary or inguinal LUNGS: clear to auscultation and percussion with normal breathing effort HEART: regular rate & rhythm and no murmurs and no lower extremity edema ABDOMEN:abdomen soft, non-tender and normal bowel sounds Musculoskeletal:no cyanosis of digits and no clubbing  PSYCH: alert & oriented x 3 with fluent speech NEURO: no focal motor/sensory deficits Breasts: Breast inspection showed them to be symmetrical with no nipple discharge. The left breast incision is healing well, clean, no surrounding skin erythema. There is fullness underneath the surgical incision, likely surgical scar or seroma. Palpation of the right breast and bilateral axilla with showed no palpable mass or adenopathy.  LABORATORY DATA:  I have reviewed the data as listed CBC Latest Ref Rng 01/26/2015 01/06/2015 12/27/2014  WBC 3.9 - 10.3 10e3/uL 29.2(H) 27.0(H) 2.7(L)  Hemoglobin 11.6 - 15.9 g/dL 11.5(L) 11.8 12.1  Hematocrit 34.8 - 46.6 % 35.2 35.1 35.8  Platelets 145 - 400 10e3/uL 310 373 313     Recent Labs  12/24/14 0813 12/27/14 1243 01/06/15 1211  NA 139 140 135*  K 4.7 4.1 3.9  CO2 _0 GLUCOSE 97 91 116  BUN 10.7 8.7 21.1  CREATININE 0.8 0.7 0.8  CALCIUM 9.4 9.3 9.9  PROT 6.4 6.6 7.3  ALBUMIN 3.6 3.7 3.9  AST _1 ALT 24 26 39  ALKPHOS 79 73 79  BILITOT 0.35 0.31 <0.30    Pathology report Diagnosis 1. Breast, lumpectomy, Left - INVASIVE DUCTAL CARCINOMA, GRADE 3, SPANNING 2 CM. - DUCTAL CARCINOMA IN SITU, HIGH GRADE. - RESECTION MARGINS ARE NEGATIVE FOR INVASIVE AND IN SITU CARCINOMA. -  LOBULAR NEOPLASIA (ATYPICAL LOBULAR HYPERPLASIA). - SEE ONCOLOGY TABLE. 2. Lymph node, sentinel, biopsy, Left axillary #1 - ONE OF ONE LYMPH NODES NEGATIVE FOR CARCINOMA (0/1). 3. Lymph node, sentinel, biopsy, Left axillary #2 - MICROMETASTASIS IN ONE OF ONE LYMPH NODE (1/1). 4. Lymph  node, sentinel, biopsy, Left axillary #3 - ONE OF ONE LYMPH NODES NEGATIVE FOR CARCINOMA (0/1).  Microscopic Comment 1. BREAST, INVASIVE TUMOR, WITH LYMPH NODES PRESENT Specimen, including laterality and lymph node sampling (sentinel, non-sentinel): Left breast lump and left axillary sentinel lymph nodes. Procedure: Left breast lumpectomy and left axillary sentinel lymph node biopsies. Histologic type: Invasive ductal carcinoma. Grade: 3 Tubule formation: 3 Nuclear pleomorphism: 3 Mitotic: 2 Tumor size (gross measurement): 2 cm Margins: Invasive, distance to closest margin: 0.2 cm (inferior). In-situ, distance to closest margin: 0.4 cm (superior). If margin positive, focally or broadly: N/A. Lymphovascular invasion: Definitive invasion not identified. Ductal carcinoma in situ: Present. Grade: III Extensive intraductal component: No. Lobular neoplasia: Present (atypical lobular hyperplasia). Tumor focality: Unifocal. Treatment effect: N/A. Extent of tumor: Confined to breast parenchyma. Lymph nodes: Examined: 3 Sentinel 0 Non-sentinel 3 Total Lymph nodes with metastasis: 1 Isolated tumor cells (< 0.2 mm): 0 Micrometastasis: (> 0.2 mm and < 2.0 mm): 1 Macrometastasis: (> 2.0 mm): 0 Extracapsular extension: No. Breast prognostic profile: Performed on biopsy ([id number]). Her2 will be repeated on current specimen. Estrogen receptor: Positive, strong staining intensity. Progesterone receptor: Positive, strong staining intensity. Her 2 neu: Negative. Ki-67: 60%. Non-neoplastic breast: Fibrocystic change. TNM: pT1c, pN17m  Results: HER2 - NEGATIVE RATIO OF HER2/CEP17 SIGNALS 1.15 AVERAGE  HER2 COPY NUMBER PER CELL 3.15  Oncotype Dx RS: 30, predicts 20% 10-y risk of distant recurrence with tamoxifen alone   RADIOGRAPHIC STUDIES: I have personally reviewed the radiological images as listed and agreed with the findings in the report.  No new scans  ASSESSMENT & PLAN:  54year old Caucasian female, postmenopausal, presented with palpable left breast mass  1. Left breast invasive ductal carcinoma, [id number], stage IB, grade 3, ER positive, PR positive, HER-2 negative, (+) DCIS -I previously reviewed her surgical pathology results in great details with patient and her husband. -She is now status post lumpectomy and sentinel lymph node biopsy. Surgical margins were negative. She has early-stage breast cancer, likely acute by complete surgical resection -I discussed the risk of cancer recurrence after her surgery. The Oncotype DX test results were explained to her in details. Her recurrence score is 30, which predicts 20% 10 year risk of distant recurrence with tamoxifen alone. Although it is intermediate risk, her score is very close to high risk range, she is young and fit, I recommend adjuvant chemotherapy to reduce the risk of cancer recurrence. -Giving her early stage disease, I recommend adjuvant docetaxel and Cytoxan, every 3 weeks, for total 4 cycles. She has tolerated the first 2 cycles well. -Lab reviewed, adequate for treatment, well proceed third cycle today.  -Giving her strong ER/PR positivity, I recommend adjuvant endocrine therapy with aromatase inhibitor for 5-10 years. -She would also need adjuvant breast irradiation for local control, she was seen by radiation oncologist Dr. MuValere Drossecently and will start radiation after she completes adjuvant chemo   2. Leukocytosis -Secondary to dexamethasone she took yesterday. -No clinical signs of infection  3. Mild anemia -Secondary to chemotherapy. -We'll continue to monitor closely. She is asymptomatic, no need for blood  transfusion.   3. Anxiety and depression -stable. She takes xanax as needed.   5. GERD -She is on omeprazole, and takes Tums and night  -we discussed dexa around chemo may exacerbate her acid reflux.  Plan -third cycle of docetaxel and Cytoxan today, no Neulasta -She will return in 3 weeks for cycle 4 (last cycle)  All questions were answered. The patient knows to  call the clinic with any problems, questions or concerns. I spent 20 minutes counseling the patient face to face. The total time spent in the appointment was 25 minutes and more than 50% was on counseling.     Truitt Merle, MD 01/26/2015 9:40 AM

## 2015-01-26 NOTE — Patient Instructions (Signed)
[facility] Discharge Instructions for Patients Receiving Chemotherapy  Today you received the following chemotherapy agents;  Taxotere and Cytoxan.    To help prevent nausea and vomiting after your treatment, we encourage you to take your nausea medication as directed.     If you develop nausea and vomiting that is not controlled by your nausea medication, call the clinic.   BELOW ARE SYMPTOMS THAT SHOULD BE REPORTED IMMEDIATELY:  *FEVER GREATER THAN 100.5 F  *CHILLS WITH OR WITHOUT FEVER  NAUSEA AND VOMITING THAT IS NOT CONTROLLED WITH YOUR NAUSEA MEDICATION  *UNUSUAL SHORTNESS OF BREATH  *UNUSUAL BRUISING OR BLEEDING  TENDERNESS IN MOUTH AND THROAT WITH OR WITHOUT PRESENCE OF ULCERS  *URINARY PROBLEMS  *BOWEL PROBLEMS  UNUSUAL RASH Items with * indicate a potential emergency and should be followed up as soon as possible.  Feel free to call the clinic you have any questions or concerns. The clinic phone number is [phone].  Please show the CHEMO ALERT CARD at check-in to the Emergency Department and triage nurse.   

## 2015-02-03 ENCOUNTER — Encounter: Payer: Self-pay | Admitting: Hematology

## 2015-02-15 ENCOUNTER — Other Ambulatory Visit: Payer: Self-pay | Admitting: *Deleted

## 2015-02-15 ENCOUNTER — Encounter: Payer: Self-pay | Admitting: Hematology

## 2015-02-15 MED FILL — DEXAMETHASONE 4 MG TABLET: 4 | 8 days supply | Qty: 35 | Fill #0

## 2015-02-15 NOTE — Telephone Encounter (Signed)
Called pt & informed that script was sent to [facility] asked if that script had been picked up.  She reports that she did not know about this script & will check with them to see if they still have it.

## 2015-02-17 ENCOUNTER — Ambulatory Visit (HOSPITAL_BASED_OUTPATIENT_CLINIC_OR_DEPARTMENT_OTHER): Payer: BLUE CROSS/BLUE SHIELD

## 2015-02-17 ENCOUNTER — Other Ambulatory Visit (HOSPITAL_BASED_OUTPATIENT_CLINIC_OR_DEPARTMENT_OTHER): Payer: BLUE CROSS/BLUE SHIELD

## 2015-02-17 ENCOUNTER — Encounter: Payer: Self-pay | Admitting: Hematology

## 2015-02-17 ENCOUNTER — Telehealth: Payer: Self-pay | Admitting: Hematology

## 2015-02-17 ENCOUNTER — Ambulatory Visit (HOSPITAL_BASED_OUTPATIENT_CLINIC_OR_DEPARTMENT_OTHER): Payer: BLUE CROSS/BLUE SHIELD | Admitting: Hematology

## 2015-02-17 VITALS — BP 113/58 | HR 64 | Temp 98.0°F | Resp 17 | Ht 61.0 in | Wt 165.2 lb

## 2015-02-17 DIAGNOSIS — D72829 Elevated white blood cell count, unspecified: Secondary | ICD-10-CM

## 2015-02-17 DIAGNOSIS — Z5111 Encounter for antineoplastic chemotherapy: Secondary | ICD-10-CM

## 2015-02-17 DIAGNOSIS — D6481 Anemia due to antineoplastic chemotherapy: Secondary | ICD-10-CM

## 2015-02-17 DIAGNOSIS — C50412 Malignant neoplasm of upper-outer quadrant of left female breast: Secondary | ICD-10-CM

## 2015-02-17 DIAGNOSIS — K219 Gastro-esophageal reflux disease without esophagitis: Secondary | ICD-10-CM

## 2015-02-17 DIAGNOSIS — F418 Other specified anxiety disorders: Secondary | ICD-10-CM

## 2015-02-17 LAB — COMPREHENSIVE METABOLIC PANEL
ALBUMIN: 3.7 g/dL (ref 3.5–5.0)
ALK PHOS: 73 U/L (ref 40–150)
ALT: 28 U/L (ref 0–55)
ANION GAP: 12 meq/L — AB (ref 3–11)
AST: 20 U/L (ref 5–34)
BILIRUBIN TOTAL: 0.31 mg/dL (ref 0.20–1.20)
BUN: 19.5 mg/dL (ref 7.0–26.0)
CO2: 22 mEq/L (ref 22–29)
Calcium: 9.6 mg/dL (ref 8.4–10.4)
Chloride: 103 mEq/L (ref 98–109)
Creatinine: 0.7 mg/dL (ref 0.6–1.1)
Glucose: 125 mg/dl (ref 70–140)
Potassium: 4.1 mEq/L (ref 3.5–5.1)
Sodium: 137 mEq/L (ref 136–145)
TOTAL PROTEIN: 7 g/dL (ref 6.4–8.3)

## 2015-02-17 LAB — CBC WITH DIFFERENTIAL/PLATELET
BASO%: 0 % (ref 0.0–2.0)
BASOS ABS: 0 10*3/uL (ref 0.0–0.1)
EOS ABS: 0 10*3/uL (ref 0.0–0.5)
EOS%: 0 % (ref 0.0–7.0)
HCT: 32.7 % — ABNORMAL LOW (ref 34.8–46.6)
HGB: 11 g/dL — ABNORMAL LOW (ref 11.6–15.9)
LYMPH%: 4.5 % — AB (ref 14.0–49.7)
MCH: 30.9 pg (ref 25.1–34.0)
MCHC: 33.6 g/dL (ref 31.5–36.0)
MCV: 91.9 fL (ref 79.5–101.0)
MONO#: 1.3 10*3/uL — AB (ref 0.1–0.9)
MONO%: 5 % (ref 0.0–14.0)
NEUT%: 90.5 % — ABNORMAL HIGH (ref 38.4–76.8)
NEUTROS ABS: 23.5 10*3/uL — AB (ref 1.5–6.5)
PLATELETS: 257 10*3/uL (ref 145–400)
RBC: 3.56 10*6/uL — AB (ref 3.70–5.45)
RDW: 16.3 % — ABNORMAL HIGH (ref 11.2–14.5)
WBC: 26 10*3/uL — AB (ref 3.9–10.3)
lymph#: 1.2 10*3/uL (ref 0.9–3.3)

## 2015-02-17 MED ORDER — SODIUM CHLORIDE 0.9 % IV SOLN
Freq: Once | INTRAVENOUS | Status: AC
  Administered 2015-02-17: 11:00:00 via INTRAVENOUS

## 2015-02-17 MED ORDER — DOCETAXEL CHEMO INJECTION 160 MG/16ML
75.0000 mg/m2 | Freq: Once | INTRAVENOUS | Status: AC
  Administered 2015-02-17: 130 mg via INTRAVENOUS
  Filled 2015-02-17: qty 13

## 2015-02-17 MED ORDER — DEXAMETHASONE SODIUM PHOSPHATE 100 MG/10ML IJ SOLN
Freq: Once | INTRAMUSCULAR | Status: AC
  Administered 2015-02-17: 12:00:00 via INTRAVENOUS
  Filled 2015-02-17: qty 8

## 2015-02-17 MED ORDER — SODIUM CHLORIDE 0.9 % IV SOLN
600.0000 mg/m2 | Freq: Once | INTRAVENOUS | Status: AC
  Administered 2015-02-17: 1060 mg via INTRAVENOUS
  Filled 2015-02-17: qty 53

## 2015-02-17 NOTE — Patient Instructions (Signed)
[facility] Discharge Instructions for Patients Receiving Chemotherapy  Today you received the following chemotherapy agents: Taxotere and Cytoxan.  To help prevent nausea and vomiting after your treatment, we encourage you to take your nausea medication:Zofran 8 mg every every 12 hours as needed.   If you develop nausea and vomiting that is not controlled by your nausea medication, call the clinic.   BELOW ARE SYMPTOMS THAT SHOULD BE REPORTED IMMEDIATELY:  *FEVER GREATER THAN 100.5 F  *CHILLS WITH OR WITHOUT FEVER  NAUSEA AND VOMITING THAT IS NOT CONTROLLED WITH YOUR NAUSEA MEDICATION  *UNUSUAL SHORTNESS OF BREATH  *UNUSUAL BRUISING OR BLEEDING  TENDERNESS IN MOUTH AND THROAT WITH OR WITHOUT PRESENCE OF ULCERS  *URINARY PROBLEMS  *BOWEL PROBLEMS  UNUSUAL RASH Items with * indicate a potential emergency and should be followed up as soon as possible.  Feel free to call the clinic you have any questions or concerns. The clinic phone number is [phone].  Please show the [facility] at check-in to the Emergency Department and triage nurse.

## 2015-02-17 NOTE — Progress Notes (Signed)
[place]  [phone] [phone]  Clinic follow up Note   Patient Care Team: Lupita Dawn, MD as PCP - General (Family Medicine) Stark Klein, MD as Consulting Physician (General Surgery) Truitt Merle, MD as Consulting Physician (Hematology) Arloa Koh, MD as Consulting Physician (Radiation Oncology) Rockwell Germany, RN as Registered Nurse Mauro Kaufmann, RN as Registered Nurse Sylvan Cheese, NP as Nurse Practitioner (Nurse Practitioner) 02/17/2015  CHIEF COMPLAINTS:  Follow up left breast cancer  Oncology History   Breast cancer of upper-outer quadrant of left female breast [facility])   Staging form: Breast, AJCC 7th Edition     Clinical stage from 11/10/2014: Stage IIA (T2, N0, M0) - Signed by Truitt Merle, MD on 11/10/2014     Pathologic stage from 11/18/2014: Stage IB (T1c, N56m, cM0) - Signed by JEnid Cutter MD on 12/01/2014       Staging comments: Staged on lumpectomy specimen by Dr. MTresa Moore         Breast cancer of upper-outer quadrant of left female breast ([facility]   10/28/2014 Mammogram Screening and the diagnostic mammogram shows a 2.1 x 1.3 x 2.1 cm it irregular mass in the left breast 2:00 position, 2 cm from the nipple.   11/01/2014 Receptors her2 ER 100%, PR100%+, HER2-, KI67 60%    11/01/2014 Initial Biopsy left breast biopsy showed invasive ductal carcinoma, grade 2, and DCIS    11/04/2014 Initial Diagnosis Breast cancer of upper-outer quadrant of left female breast ([facility]   11/16/2014 Oncotype testing RS 30, predicts 20% 10-y risk of distant recurrence with tamoxifen alone    11/16/2014 Surgery left breast lumpectomy iwth SLN biopsy.    11/16/2014 Pathology Results left breast invasive ductal carcinoma, G3, 1 out of 3 SLN had micrometastasis, (-)LVI, margines negative,  (+) high grade DCIS   12/15/2014 -  Adjuvant Chemotherapy Docetaxel 75 mg/m, Cytoxan 600 mg/m, every 3 weeks, planning for total 4 cycles.    HISTORY OF  PRESENTING ILLNESS:  DRhyseRiffey 55y.o. female is here because of her recently diagnosed left breast cancer. She is accompanied by her husband, daughter, mother to our multidisciplinary breast clinic today.  She felt a lump in her left breast one  Month ago, mild tender, she otherwise feels well no other new symptoms. This was developed by her primary care physician and screening mammogram was obtained. Mammogram reviewed a 2.1 cm mass in the left breast 2:00 position, core needle biopsy showed invasive ductal carcinoma, and DCIS.  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 She feels well overall. She does have mild low back pain, does not take any education. She is physically active at home, but does not exercise regularly. No family history of breast cancer. She was on hormone replacement for the past 8 years, and just stopped progesterone a few weeks ago.  CURRENT THERAPY: Docetaxel 75 mg/m, Cytoxan 600 mg/m, every 3 weeks, started on 12/15/2014  INTERIM HISTORY  Mrs Zalewski returns for follow up and last cycle chemotherapy. She has been tolerating chemotherapy very well, no significant nausea, diarrhea, or appetite issue. She has some hot flash, does not see well and night. Otherwise doing very well. She has gained some weight since he started chemotherapy.  MEDICAL HISTORY:  Past Medical History  Diagnosis Date  . Wrist pain   . Sore throat 10/27/2012    [facility] vocal cord polyp left, bilateral edema, significant postcricoid edema, c/w  chronic tobacco use and chronic reflux. - Recheck 2 months - Encouraged tobacco cessation to reduce risk of laryngeal cancer     . Anxiety   . Depression   . Hot flashes   . GERD (gastroesophageal reflux disease)     SURGICAL HISTORY: Past Surgical History  Procedure Laterality Date  . Cyst removal hand    . Tubal ligation  1995  . Breast lumpectomy with axillary lymph node biopsy Left  11/16/2014    Procedure: LEFT BREAST LUMPECTOMY WITH AXILLARY LYMPH NODE BIOPSY;  Surgeon: Stark Klein, MD;  Location: [facility];  Service: General;  Laterality: Left;   GYN HISTORY  Menarchal: 10 LMP: 48 Contraceptive: 16 HRT: 8 years, stopped in 10/2014  G2P2:    SOCIAL HISTORY: Social History   Social History  . Marital Status: Married    Spouse Name: N/A  . Number of Children: N/A  . Years of Education: N/A   Occupational History  . Not on file.   Social History Main Topics  . Smoking status: Former Smoker -- 1.50 packs/day for 40 years    Types: Cigarettes    Quit date: 11/04/2012  . Smokeless tobacco: Never Used  . Alcohol Use: Yes     Comment: social users   . Drug Use: No  . Sexual Activity:    Partners: Male     Comment: Husband   Other Topics Concern  . Not on file   Social History Narrative   Lives with husband in [place].   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 Supraventricular tachycardia Mother   . Thyroid disease Mother 41    unsure what type   . Cancer Father     throat cancer - smoker  . Stroke Father     ALLERGIES:  is allergic to codeine.  MEDICATIONS:  Current Outpatient Prescriptions  Medication Sig Dispense Refill  . ALPRAZolam (XANAX) 0.25 MG tablet Take 1-2 tablets (0.25-0.5 mg total) by mouth at bedtime as needed for anxiety or sleep. 50 tablet 0  . aspirin 81 MG tablet Take 81 mg by mouth daily.    . calcium carbonate (OS-CAL) 600 MG TABS tablet Take 600 mg by mouth daily with breakfast.    . cholecalciferol (VITAMIN D) 1000 UNITS tablet Take 2,000 Units by mouth daily.    Marland Kitchen dexamethasone (DECADRON) 4 MG tablet Take 2 tablets (8 mg total) by mouth 2 (two) times daily. Start the day before Taxotere. Then again the day after chemo for 3  days. 35 tablet 0  . diphenhydrAMINE (SOMINEX) 25 MG tablet Take 50 mg by mouth at bedtime as needed for sleep.     . fish oil-omega-3 fatty acids 1000 MG capsule Take 2 g by mouth daily.    . fluticasone (FLONASE) 50 MCG/ACT nasal spray Place 2 sprays into both nostrils daily. 16 g 0  . Inulin (PHILLIPS FIBER GOOD PO) Take by mouth.    . L-Glutamine 500 MG TABS Take 1 tablet by mouth 4 (four) times daily.    . magic mouthwash w/lidocaine SOLN Take 5 mLs by mouth 4 (four) times daily as needed for mouth pain. 240 mL 1  . Multiple Vitamin (MULTIVITAMIN) tablet Take 1 tablet by mouth daily.    Marland Kitchen  omeprazole (PRILOSEC) 40 MG capsule Take one tablet daily.    . ondansetron (ZOFRAN) 8 MG tablet Take 1 tablet (8 mg total) by mouth 2 (two) times daily. Start the day after chemo for 3 days. Then take as needed for nausea or vomiting. 30 tablet 1  . oxyCODONE-acetaminophen (PERCOCET/ROXICET) 5-325 MG tablet   0   No current facility-administered medications for this visit.    REVIEW OF SYSTEMS:   Constitutional: Denies fevers, chills or abnormal night sweats Eyes: Denies blurriness of vision, double vision or watery eyes Ears, nose, mouth, throat, and face: Denies mucositis or sore throat Respiratory: Denies cough, dyspnea or wheezes Cardiovascular: Denies palpitation, chest discomfort or lower extremity swelling Gastrointestinal:  Denies nausea, heartburn or change in bowel habits Skin: Denies abnormal skin rashes Lymphatics: Denies new lymphadenopathy or easy bruising Neurological:Denies numbness, tingling or new weaknesses Behavioral/Psych: Mood is stable, no new changes  All other systems were reviewed with the patient and are negative.  PHYSICAL EXAMINATION: ECOG PERFORMANCE STATUS: 0 - Asymptomatic  Filed Vitals:   02/17/15 0921  BP: 113/58  Pulse: 64  Temp: 98 F (36.7 C)  Resp: 17   Filed Weights   02/17/15 0921  Weight: 165 lb 3.2 oz (74.934 kg)    GENERAL:alert, no  distress and comfortable SKIN: skin color, texture, turgor are normal, there are a few scattered small skin rash on her scalp EYES: normal, conjunctiva are pink and non-injected, sclera clear OROPHARYNX:no exudate, no erythema and lips, buccal mucosa, and tongue normal  NECK: supple, thyroid normal size, non-tender, without nodularity LYMPH:  no palpable lymphadenopathy in the cervical, axillary or inguinal LUNGS: clear to auscultation and percussion with normal breathing effort HEART: regular rate & rhythm and no murmurs and no lower extremity edema ABDOMEN:abdomen soft, non-tender and normal bowel sounds Musculoskeletal:no cyanosis of digits and no clubbing  PSYCH: alert & oriented x 3 with fluent speech NEURO: no focal motor/sensory deficits Breasts: Breast inspection showed them to be symmetrical with no nipple discharge. The left breast incision has healed well There is fullness underneath the surgical incision, likely surgical scar or seroma. Palpation of the right breast and bilateral axilla with showed no palpable mass or adenopathy.  LABORATORY DATA:  I have reviewed the data as listed CBC Latest Ref Rng 02/17/2015 01/26/2015 01/06/2015  WBC 3.9 - 10.3 10e3/uL 26.0(H) 29.2(H) 27.0(H)  Hemoglobin 11.6 - 15.9 g/dL 11.0(L) 11.5(L) 11.8  Hematocrit 34.8 - 46.6 % 32.7(L) 35.2 35.1  Platelets 145 - 400 10e3/uL 257 310 373     Recent Labs  01/06/15 1211 01/26/15 0852 02/17/15 0857  NA 135* 138 137  K 3.9 3.8 4.1  CO2 _0 GLUCOSE 116 140 125  BUN 21.1 20.6 19.5  CREATININE 0.8 0.8 0.7  CALCIUM 9.9 9.6 9.6  PROT 7.3 7.1 7.0  ALBUMIN 3.9 3.6 3.7  AST _1 ALT 39 30 28  ALKPHOS 79 90 73  BILITOT <0.30 <0.30 0.31    Pathology report Diagnosis 1. Breast, lumpectomy, Left - INVASIVE DUCTAL CARCINOMA, GRADE 3, SPANNING 2 CM. - DUCTAL CARCINOMA IN SITU, HIGH GRADE. - RESECTION MARGINS ARE NEGATIVE FOR INVASIVE AND IN SITU CARCINOMA. - LOBULAR NEOPLASIA (ATYPICAL  LOBULAR HYPERPLASIA). - SEE ONCOLOGY TABLE. 2. Lymph node, sentinel, biopsy, Left axillary #1 - ONE OF ONE LYMPH NODES NEGATIVE FOR CARCINOMA (0/1). 3. Lymph node, sentinel, biopsy, Left axillary #2 - MICROMETASTASIS IN ONE OF ONE LYMPH NODE (1/1). 4. Lymph node, sentinel, biopsy, Left  axillary #3 - ONE OF ONE LYMPH NODES NEGATIVE FOR CARCINOMA (0/1).  Microscopic Comment 1. BREAST, INVASIVE TUMOR, WITH LYMPH NODES PRESENT Specimen, including laterality and lymph node sampling (sentinel, non-sentinel): Left breast lump and left axillary sentinel lymph nodes. Procedure: Left breast lumpectomy and left axillary sentinel lymph node biopsies. Histologic type: Invasive ductal carcinoma. Grade: 3 Tubule formation: 3 Nuclear pleomorphism: 3 Mitotic: 2 Tumor size (gross measurement): 2 cm Margins: Invasive, distance to closest margin: 0.2 cm (inferior). In-situ, distance to closest margin: 0.4 cm (superior). If margin positive, focally or broadly: N/A. Lymphovascular invasion: Definitive invasion not identified. Ductal carcinoma in situ: Present. Grade: III Extensive intraductal component: No. Lobular neoplasia: Present (atypical lobular hyperplasia). Tumor focality: Unifocal. Treatment effect: N/A. Extent of tumor: Confined to breast parenchyma. Lymph nodes: Examined: 3 Sentinel 0 Non-sentinel 3 Total Lymph nodes with metastasis: 1 Isolated tumor cells (< 0.2 mm): 0 Micrometastasis: (> 0.2 mm and < 2.0 mm): 1 Macrometastasis: (> 2.0 mm): 0 Extracapsular extension: No. Breast prognostic profile: Performed on biopsy ([id number]). Her2 will be repeated on current specimen. Estrogen receptor: Positive, strong staining intensity. Progesterone receptor: Positive, strong staining intensity. Her 2 neu: Negative. Ki-67: 60%. Non-neoplastic breast: Fibrocystic change. TNM: pT1c, pN58m  Results: HER2 - NEGATIVE RATIO OF HER2/CEP17 SIGNALS 1.15 AVERAGE HER2 COPY NUMBER PER CELL  3.15  Oncotype Dx RS: 30, predicts 20% 10-y risk of distant recurrence with tamoxifen alone   RADIOGRAPHIC STUDIES: I have personally reviewed the radiological images as listed and agreed with the findings in the report.  No new scans  ASSESSMENT & PLAN:  55year old Caucasian female, postmenopausal, presented with palpable left breast mass  1. Left breast invasive ductal carcinoma, [id number], stage IB, grade 3, ER positive, PR positive, HER-2 negative, (+) DCIS -I previously reviewed her surgical pathology results in great details with patient and her husband. -She is now status post lumpectomy and sentinel lymph node biopsy. Surgical margins were negative. She has early-stage breast cancer, likely acute by complete surgical resection -I discussed the risk of cancer recurrence after her surgery. The Oncotype DX test results were explained to her in details. Her recurrence score is 30, which predicts 20% 10 year risk of distant recurrence with tamoxifen alone. Although it is intermediate risk, her score is very close to high risk range, she is young and fit, I recommend adjuvant chemotherapy to reduce the risk of cancer recurrence. -Giving her early stage disease, I recommend adjuvant docetaxel and Cytoxan, every 3 weeks, for total 4 cycles. She has tolerated the chemotherapy very well so far. -Lab reviewed, adequate for treatment, well proceed the last cycle today.  -Giving her strong ER/PR positivity, I recommend adjuvant endocrine therapy with aromatase inhibitor for 5-10 years. -She would also need adjuvant breast irradiation for local control, she was seen by radiation oncologist Dr. MuValere Drossecently and will start radiation after she completes adjuvant chemo   2. Leukocytosis -Secondary to dexamethasone she took yesterday. -No clinical signs of infection  3. Mild anemia -Secondary to chemotherapy, mild and stable overall he would no need for transfusion -We'll continue to monitor  closely.    3. Anxiety and depression -stable. She takes xanax as needed.   5. GERD -She is on omeprazole, and takes Tums and night  -we discussed dexa around chemo may exacerbate her acid reflux.  Plan -last cycle of docetaxel and Cytoxan today, no Neulasta -She will proceed with breast irradiation soon. I'll refer her back to radiation oncology. -She will return in 2  months for follow up and adjuvant antiestrogen therapy.  All questions were answered. The patient knows to call the clinic with any problems, questions or concerns. I spent 20 minutes counseling the patient face to face. The total time spent in the appointment was 25 minutes and more than 50% was on counseling.     Truitt Merle, MD 02/17/2015

## 2015-02-17 NOTE — Telephone Encounter (Signed)
Gv pt appt for 04/14/15.

## 2015-02-18 ENCOUNTER — Telehealth: Payer: Self-pay | Admitting: *Deleted

## 2015-02-18 NOTE — Telephone Encounter (Signed)
Left message for a return phone call to follow up after completion of chemo.

## 2015-02-21 ENCOUNTER — Telehealth: Payer: Self-pay | Admitting: Hematology

## 2015-02-21 NOTE — Telephone Encounter (Signed)
per pof to sch pt appt referral-rad onc will call pt to sch appt

## 2015-03-01 NOTE — Progress Notes (Signed)
Chemotherapy Docetaxel, Cytoxan 4 cycles start date 12-15-14 - Last dose  02-17-15 Has weight gain and having hot flashes  BP 100/63 mmHg  Pulse 85  Temp(Src) 98.1 F (36.7 C) (Oral)  Ht 5\' 1"  (1.549 m)  Wt 165 lb 1.6 oz (74.889 kg)  BMI 31.21 kg/m2  SpO2 96% Wt Readings from Last 3 Encounters:  03/02/15 165 lb 1.6 oz (74.889 kg)  02/17/15 165 lb 3.2 oz (74.934 kg)  01/26/15 161 lb 8 oz (73.256 kg)

## 2015-03-02 ENCOUNTER — Encounter: Payer: Self-pay | Admitting: Radiation Oncology

## 2015-03-02 ENCOUNTER — Ambulatory Visit
Admission: RE | Admit: 2015-03-02 | Discharge: 2015-03-02 | Disposition: A | Discharge status: Home / Self Care | Payer: BLUE CROSS/BLUE SHIELD | Source: Ambulatory Visit | Attending: Radiation Oncology | Admitting: Radiation Oncology

## 2015-03-02 VITALS — BP 100/63 | HR 85 | Temp 98.1°F | Ht 61.0 in | Wt 165.1 lb

## 2015-03-02 DIAGNOSIS — Z170 Estrogen receptor positive status [ER+]: Secondary | ICD-10-CM | POA: Diagnosis not present

## 2015-03-02 DIAGNOSIS — C50412 Malignant neoplasm of upper-outer quadrant of left female breast: Secondary | ICD-10-CM

## 2015-03-02 DIAGNOSIS — C50912 Malignant neoplasm of unspecified site of left female breast: Secondary | ICD-10-CM | POA: Insufficient documentation

## 2015-03-02 HISTORY — DX: Malignant neoplasm of unspecified site of unspecified female breast: C50.919

## 2015-03-02 NOTE — Progress Notes (Signed)
   Department of Radiation Oncology  Phone:  [phone] Fax:        [phone]   Name: Shirley Howard MRN: [id number]  DOB: September 01, 1960  Date: 03/02/2015  Follow Up Visit Note  Diagnosis: Breast cancer of upper-outer quadrant of left female breast [facility])   Staging form: Breast, AJCC 7th Edition     Clinical stage from 11/10/2014: Stage IIA (T2, N0, M0) - Signed by Truitt Merle, MD on 11/10/2014     Pathologic stage from 11/18/2014: Stage IB (T1c, N62mi, cM0) - Signed by Enid Cutter, MD on 12/01/2014       Staging comments: Staged on lumpectomy specimen by Dr. Tresa Moore   Interval History: Shirley Howard presents today for followup. Chemotherapy Docetaxel, Cytoxan 4 cycles start date 12-15-14 - Last dose 02-17-15. Reports weight gain and having hot flashes. Reports fatigue. Denies neuropathy.   Shirley Howard has completed chemotherapy and is ready to begin radiation treatment.    Physical Exam:  Filed Vitals:   03/02/15 1508  BP: 100/63  Pulse: 85  Temp: 98.1 F (36.7 C)  TempSrc: Oral  Height: 5\' 1"  (1.549 m)  Weight: 165 lb 1.6 oz (74.889 kg)  SpO2: 96%    General: Alert and oriented. Breast: Left breast is well healed with no signs of infection.   IMPRESSION: Shirley Howard is a 55 y.o. female with Stage IB (T1c, N8mi, cM0) Breast cancer of upper-outer quadrant of left female breast  PLAN:  I spoke to the patient today regarding her diagnosis and options for treatment. We discussed the equivalence in terms of survival and local failure between mastectomy and breast conservation. We discussed the role of radiation in decreasing local failures in patients who undergo lumpectomy. We discussed the process of simulation and the placement tattoos. We discussed 6 weeks of treatment as an outpatient. We discussed the possibility of asymptomatic lung damage. We discussed the low likelihood of secondary malignancies. We discussed the possible side effects including but not limited to skin redness, fatigue,  permanent skin darkening, and breast swelling.  We discussed the use of cardiac sparing with deep inspiration breath hold if needed.  The patient has been scheduled for simulation and treatment planning with high tangents next Thursday, 2/2, at 8 am. Informed consent was signed.     Thea Silversmith, MD  This document serves as a record of services personally performed by Thea Silversmith, MD. It was created on her behalf by Arlyce Harman, a trained medical scribe. The creation of this record is based on the scribe's personal observations and the provider's statements to them. This document has been checked and approved by the attending provider.

## 2015-03-03 NOTE — Addendum Note (Signed)
Encounter addended by: Malena Edman, RN on: 03/03/2015  5:26 PM<BR>     Documentation filed: Charges VN

## 2015-03-03 NOTE — Addendum Note (Signed)
Encounter addended by: Malena Edman, RN on: 03/03/2015  5:23 PM<BR>     Documentation filed: Charges VN

## 2015-03-09 ENCOUNTER — Encounter: Payer: Self-pay | Admitting: Gastroenterology

## 2015-03-10 ENCOUNTER — Ambulatory Visit
Admission: RE | Admit: 2015-03-10 | Discharge: 2015-03-10 | Disposition: A | Discharge status: Home / Self Care | Payer: BLUE CROSS/BLUE SHIELD | Source: Ambulatory Visit | Attending: Radiation Oncology | Admitting: Radiation Oncology

## 2015-03-10 DIAGNOSIS — C50912 Malignant neoplasm of unspecified site of left female breast: Secondary | ICD-10-CM | POA: Diagnosis not present

## 2015-03-10 DIAGNOSIS — C50412 Malignant neoplasm of upper-outer quadrant of left female breast: Secondary | ICD-10-CM

## 2015-03-10 NOTE — Progress Notes (Signed)
Radiation Oncology         [phone]) [phone] ________________________________  Name: Shirley Howard      MRN: [id number]          Date: 03/10/2015              DOB: 1960/12/11  Optical Surface Tracking Plan:  Since intensity modulated radiotherapy (IMRT) and 3D conformal radiation treatment methods are predicated on accurate and precise positioning for treatment, intrafraction motion monitoring is medically necessary to ensure accurate and safe treatment delivery.  The ability to quantify intrafraction motion without excessive ionizing radiation dose can only be performed with optical surface tracking. Accordingly, surface imaging offers the opportunity to obtain 3D measurements of patient position throughout IMRT and 3D treatments without excessive radiation exposure.  I am ordering optical surface tracking for this patient's upcoming course of radiotherapy. ________________________________  Reference:   Ursula Alert, J, et al. Surface imaging-based analysis of intrafraction motion for breast radiotherapy patients.Journal of [place], n. 6, nov. 2014. ISSN [id number].   Available at: <http://www.jacmp.org/index.php/jacmp/article/view/4957>.   This document serves as a record of services personally performed by Thea Silversmith, MD. It was created on her behalf by Darcus Austin, a trained medical scribe. The creation of this record is based on the scribe's personal observations and the provider's statements to them. This document has been checked and approved by the attending provider.

## 2015-03-10 NOTE — Progress Notes (Signed)
Name: Maeve Mckiver   MRN: [id number]  Date:  03/10/2015  DOB: [birth year]  Status:outpatient   DIAGNOSIS: Left Breast cancer.  CONSENT VERIFIED: yes SET UP: Patient is setup supine  IMMOBILIZATION:  The following immobilization was used:Custom Moldable Pillow, breast board.  NARRATIVE: Ms. South was brought to the [facility].  Identity was confirmed.  All relevant records and images related to the planned course of therapy were reviewed.  Then, the patient was positioned in a stable reproducible clinical set-up for radiation therapy.  Wires were placed to delineate the clinical extent of breast tissue. A wire was placed on the scar as well.  CT images were obtained.  An isocenter was placed. Skin markings were placed.  The position of the heart was then analyzed.  Due to the proximity of the heart to the chest wall, I felt she would benefit from deep inspiration breath hold for cardiac sparing.  She was then coached and rescanned in the breath hold position.  Acceptable cardiac sparing was achieved. The CT images were loaded into the planning software where the target and avoidance structures were contoured.  The radiation prescription was entered and confirmed. The patient was discharged in stable condition and tolerated simulation well.    TREATMENT PLANNING NOTE/3D Simulation Note Treatment planning then occurred. I have requested : MLC's, isodose plan, basic dose calculation  3D simulation was performed.  I personally designed and supervised the construction of 3 medically necessary complex treatment devices in the form of MLCs which will be used for beam modification and to protect critical structures including the heart and lung as well as the immobilization device which is necessary for reproducible set up.  I have requested a dose volume histogram of the heart, lung and tumor cavity.   Special treatment procedure was performed today due to the extra time and effort  required by myself to plan and prepare this patient for deep inspiration breath hold technique.  I have determined cardiac sparing to be of benefit to this patient to prevent long term cardiac damage due to radiation of the heart.  Bellows were placed on the patient's abdomen. To facilitate cardiac sparing, the patient was coached by the radiation therapists on breath hold techniques and breathing practice was performed. Practice waveforms were obtained. The patient was then scanned while maintaining breath hold in the treatment position.  This image was then transferred over to the imaging specialist. The imaging specialist then created a fusion of the free breathing and breath hold scans using the chest wall as the stable structure. I personally reviewed the fusion in axial, coronal and sagittal image planes.  Excellent cardiac sparing was obtained.  I felt the patient is an appropriate candidate for breath hold and the patient will be treated as such.  The image fusion was then reviewed with the patient to reinforce the necessity of reproducible breath hold.  This document serves as a record of services personally performed by Thea Silversmith, MD. It was created on her behalf by Darcus Austin, a trained medical scribe. The creation of this record is based on the scribe's personal observations and the provider's statements to them. This document has been checked and approved by the attending provider.

## 2015-03-11 ENCOUNTER — Other Ambulatory Visit: Payer: Self-pay | Admitting: *Deleted

## 2015-03-15 ENCOUNTER — Telehealth: Payer: Self-pay | Admitting: Hematology

## 2015-03-15 DIAGNOSIS — C50912 Malignant neoplasm of unspecified site of left female breast: Secondary | ICD-10-CM | POA: Diagnosis not present

## 2015-03-15 NOTE — Telephone Encounter (Signed)
Left message for patient re lab/YF 4/11 - 3/9 appointments cxd. Schedule mailed.

## 2015-03-17 ENCOUNTER — Ambulatory Visit
Admission: RE | Admit: 2015-03-17 | Discharge: 2015-03-17 | Disposition: A | Discharge status: Home / Self Care | Payer: BLUE CROSS/BLUE SHIELD | Source: Ambulatory Visit | Attending: Radiation Oncology | Admitting: Radiation Oncology

## 2015-03-17 DIAGNOSIS — C50912 Malignant neoplasm of unspecified site of left female breast: Secondary | ICD-10-CM | POA: Diagnosis not present

## 2015-03-21 ENCOUNTER — Ambulatory Visit
Admission: RE | Admit: 2015-03-21 | Discharge: 2015-03-21 | Disposition: A | Discharge status: Home / Self Care | Payer: BLUE CROSS/BLUE SHIELD | Source: Ambulatory Visit | Attending: Radiation Oncology | Admitting: Radiation Oncology

## 2015-03-21 DIAGNOSIS — C50912 Malignant neoplasm of unspecified site of left female breast: Secondary | ICD-10-CM | POA: Diagnosis not present

## 2015-03-22 ENCOUNTER — Ambulatory Visit
Admission: RE | Admit: 2015-03-22 | Discharge: 2015-03-22 | Disposition: A | Discharge status: Home / Self Care | Payer: BLUE CROSS/BLUE SHIELD | Source: Ambulatory Visit | Attending: Radiation Oncology | Admitting: Radiation Oncology

## 2015-03-22 ENCOUNTER — Encounter: Payer: Self-pay | Admitting: Radiation Oncology

## 2015-03-22 VITALS — BP 117/73 | HR 76 | Temp 97.8°F | Ht 61.0 in | Wt 164.3 lb

## 2015-03-22 DIAGNOSIS — C50412 Malignant neoplasm of upper-outer quadrant of left female breast: Secondary | ICD-10-CM | POA: Insufficient documentation

## 2015-03-22 DIAGNOSIS — C50912 Malignant neoplasm of unspecified site of left female breast: Secondary | ICD-10-CM | POA: Diagnosis not present

## 2015-03-22 MED ORDER — RADIAPLEXRX EX GEL
Freq: Once | CUTANEOUS | Status: AC
  Administered 2015-03-22: 16:00:00 via TOPICAL

## 2015-03-22 MED ORDER — ALRA NON-METALLIC DEODORANT (RAD-ONC)
1.0000 "application " | Freq: Once | TOPICAL | Status: AC
  Administered 2015-03-22: 1 via TOPICAL

## 2015-03-22 NOTE — Addendum Note (Signed)
Encounter addended by: Malena Edman, RN on: 03/22/2015  5:05 PM<BR>     Documentation filed: Inpatient Patient Education

## 2015-03-22 NOTE — Addendum Note (Signed)
Encounter addended by: Malena Edman, RN on: 03/22/2015  4:51 PM<BR>     Documentation filed: Inpatient Patient Education

## 2015-03-22 NOTE — Progress Notes (Signed)
Mrs. Truglio has received 2 fractions to her left breast.  Skin to left breast without any changes.  Education given for Radiaplex gel and Alra deodorant.   No pain today to left breast has Oxycodone for pain control. Appetite is good.  Energy level is slowing down.  Able to do daily exercising. BP 117/73 mmHg  Pulse 76  Temp(Src) 97.8 F (36.6 C)  Ht 5\' 1"  (1.549 m)  Wt 164 lb 4.8 oz (74.526 kg)  BMI 31.06 kg/m2  SpO2 99%

## 2015-03-22 NOTE — Progress Notes (Signed)
Weekly Management Note Current Dose:  3.6 Gy  Projected Dose: 61 Gy   Narrative:  The patient presents for routine under treatment assessment.  CBCT/MVCT images/Port film x-rays were reviewed.  The chart was checked. No pain. RN education performed.   Physical Findings: Weight: 164 lb 4.8 oz (74.526 kg). Unchanged  Impression:  The patient is tolerating radiation.  Plan:  Continue treatment as planned. Start radiaplex.

## 2015-03-23 ENCOUNTER — Ambulatory Visit
Admission: RE | Admit: 2015-03-23 | Discharge: 2015-03-23 | Disposition: A | Discharge status: Home / Self Care | Payer: BLUE CROSS/BLUE SHIELD | Source: Ambulatory Visit | Attending: Radiation Oncology | Admitting: Radiation Oncology

## 2015-03-23 DIAGNOSIS — C50912 Malignant neoplasm of unspecified site of left female breast: Secondary | ICD-10-CM | POA: Diagnosis not present

## 2015-03-24 ENCOUNTER — Ambulatory Visit
Admission: RE | Admit: 2015-03-24 | Discharge: 2015-03-24 | Disposition: A | Discharge status: Home / Self Care | Payer: BLUE CROSS/BLUE SHIELD | Source: Ambulatory Visit | Attending: Radiation Oncology | Admitting: Radiation Oncology

## 2015-03-24 DIAGNOSIS — C50912 Malignant neoplasm of unspecified site of left female breast: Secondary | ICD-10-CM | POA: Diagnosis not present

## 2015-03-25 ENCOUNTER — Ambulatory Visit
Admission: RE | Admit: 2015-03-25 | Discharge: 2015-03-25 | Disposition: A | Discharge status: Home / Self Care | Payer: BLUE CROSS/BLUE SHIELD | Source: Ambulatory Visit | Attending: Radiation Oncology | Admitting: Radiation Oncology

## 2015-03-25 DIAGNOSIS — C50912 Malignant neoplasm of unspecified site of left female breast: Secondary | ICD-10-CM | POA: Diagnosis not present

## 2015-03-28 ENCOUNTER — Ambulatory Visit
Admission: RE | Admit: 2015-03-28 | Discharge: 2015-03-28 | Disposition: A | Discharge status: Home / Self Care | Payer: BLUE CROSS/BLUE SHIELD | Source: Ambulatory Visit | Attending: Radiation Oncology | Admitting: Radiation Oncology

## 2015-03-28 ENCOUNTER — Encounter: Payer: Self-pay | Admitting: *Deleted

## 2015-03-28 ENCOUNTER — Telehealth: Payer: Self-pay | Admitting: *Deleted

## 2015-03-28 DIAGNOSIS — C50912 Malignant neoplasm of unspecified site of left female breast: Secondary | ICD-10-CM | POA: Diagnosis not present

## 2015-03-28 NOTE — Telephone Encounter (Signed)
Left message for a return phone call to follow up after start of radiation.   

## 2015-03-29 ENCOUNTER — Encounter: Payer: Self-pay | Admitting: Radiation Oncology

## 2015-03-29 ENCOUNTER — Ambulatory Visit
Admission: RE | Admit: 2015-03-29 | Discharge: 2015-03-29 | Disposition: A | Discharge status: Home / Self Care | Payer: BLUE CROSS/BLUE SHIELD | Source: Ambulatory Visit | Attending: Radiation Oncology | Admitting: Radiation Oncology

## 2015-03-29 VITALS — BP 111/71 | HR 73 | Temp 97.3°F | Ht 61.0 in | Wt 164.6 lb

## 2015-03-29 DIAGNOSIS — C50912 Malignant neoplasm of unspecified site of left female breast: Secondary | ICD-10-CM | POA: Diagnosis not present

## 2015-03-29 DIAGNOSIS — C50412 Malignant neoplasm of upper-outer quadrant of left female breast: Secondary | ICD-10-CM

## 2015-03-29 NOTE — Progress Notes (Signed)
Shirley Howard has received 7 fractions to her left breast.  Denies pain today to her left breast has some ocassional sharp pain.  Skin to left breast normal pink using Radiaplex gel as directed.  Appetite is good  Energy level is less as the day goes on usually takes a nap. BP 111/71 mmHg  Pulse 73  Temp(Src) 97.3 F (36.3 C) (Oral)  Ht 5\' 1"  (1.549 m)  Wt 164 lb 9.6 oz (74.662 kg)  BMI 31.12 kg/m2  SpO2 99%

## 2015-03-29 NOTE — Progress Notes (Signed)
Weekly Management Note Current Dose:  12.6 Gy  Projected Dose: 61 Gy   Narrative:  The patient presents for routine under treatment assessment.  CBCT/MVCT images/Port film x-rays were reviewed.  The chart was checked. No complaints except mild breast soreness "inside."  Physical Findings: Weight: 164 lb 9.6 oz (74.662 kg). Unchanged  Impression:  The patient is tolerating radiation.  Plan:  Continue treatment as planned. OK to use anti-inflammatories for breast pain.

## 2015-03-30 ENCOUNTER — Ambulatory Visit
Admission: RE | Admit: 2015-03-30 | Discharge: 2015-03-30 | Disposition: A | Discharge status: Home / Self Care | Payer: BLUE CROSS/BLUE SHIELD | Source: Ambulatory Visit | Attending: Radiation Oncology | Admitting: Radiation Oncology

## 2015-03-30 DIAGNOSIS — C50912 Malignant neoplasm of unspecified site of left female breast: Secondary | ICD-10-CM | POA: Diagnosis not present

## 2015-03-31 ENCOUNTER — Ambulatory Visit
Admission: RE | Admit: 2015-03-31 | Discharge: 2015-03-31 | Disposition: A | Discharge status: Home / Self Care | Payer: BLUE CROSS/BLUE SHIELD | Source: Ambulatory Visit | Attending: Radiation Oncology | Admitting: Radiation Oncology

## 2015-03-31 DIAGNOSIS — C50912 Malignant neoplasm of unspecified site of left female breast: Secondary | ICD-10-CM | POA: Diagnosis not present

## 2015-04-01 ENCOUNTER — Ambulatory Visit: Payer: BLUE CROSS/BLUE SHIELD

## 2015-04-01 ENCOUNTER — Ambulatory Visit
Admission: RE | Admit: 2015-04-01 | Discharge: 2015-04-01 | Disposition: A | Discharge status: Home / Self Care | Payer: BLUE CROSS/BLUE SHIELD | Source: Ambulatory Visit | Attending: Radiation Oncology | Admitting: Radiation Oncology

## 2015-04-01 DIAGNOSIS — C50912 Malignant neoplasm of unspecified site of left female breast: Secondary | ICD-10-CM | POA: Diagnosis not present

## 2015-04-02 ENCOUNTER — Ambulatory Visit: Payer: BLUE CROSS/BLUE SHIELD

## 2015-04-04 ENCOUNTER — Ambulatory Visit: Payer: BLUE CROSS/BLUE SHIELD

## 2015-04-04 ENCOUNTER — Ambulatory Visit
Admission: RE | Admit: 2015-04-04 | Discharge: 2015-04-04 | Disposition: A | Discharge status: Home / Self Care | Payer: BLUE CROSS/BLUE SHIELD | Source: Ambulatory Visit | Attending: Radiation Oncology | Admitting: Radiation Oncology

## 2015-04-04 DIAGNOSIS — C50912 Malignant neoplasm of unspecified site of left female breast: Secondary | ICD-10-CM | POA: Diagnosis not present

## 2015-04-05 ENCOUNTER — Ambulatory Visit: Payer: BLUE CROSS/BLUE SHIELD

## 2015-04-05 ENCOUNTER — Encounter: Payer: Self-pay | Admitting: Gastroenterology

## 2015-04-05 ENCOUNTER — Ambulatory Visit
Admission: RE | Admit: 2015-04-05 | Discharge: 2015-04-05 | Disposition: A | Discharge status: Home / Self Care | Payer: BLUE CROSS/BLUE SHIELD | Source: Ambulatory Visit | Attending: Radiation Oncology | Admitting: Radiation Oncology

## 2015-04-05 ENCOUNTER — Encounter: Payer: Self-pay | Admitting: Radiation Oncology

## 2015-04-05 VITALS — BP 110/71 | HR 72 | Temp 98.2°F | Ht 61.0 in | Wt 164.8 lb

## 2015-04-05 DIAGNOSIS — C50912 Malignant neoplasm of unspecified site of left female breast: Secondary | ICD-10-CM | POA: Diagnosis not present

## 2015-04-05 DIAGNOSIS — C50412 Malignant neoplasm of upper-outer quadrant of left female breast: Secondary | ICD-10-CM

## 2015-04-05 NOTE — Progress Notes (Signed)
Shirley Howard has received 12 fractions to her left breast. Denies pain today to her left breast has some ocassional sharp pain. Skin to left breast normal pink using Radiaplex gel as directed. Appetite is good Energy level is less as the day goes on usually takes a nap. BP 110/71 mmHg  Pulse 72  Temp(Src) 98.2 F (36.8 C) (Oral)  Ht 5\' 1"  (1.549 m)  Wt 164 lb 12.8 oz (74.753 kg)  BMI 31.15 kg/m2  SpO2 99%

## 2015-04-05 NOTE — Progress Notes (Signed)
Weekly Management Note Current Dose:  21.6 Gy  Projected Dose: 61 Gy   Narrative:  The patient presents for routine under treatment assessment.  CBCT/MVCT images/Port film x-rays were reviewed.  The chart was checked. No complaints. Using radiaplex.   Physical Findings: Weight: 164 lb 12.8 oz (74.753 kg). Unchanged  Impression:  The patient is tolerating radiation.  Plan:  Continue treatment as planned. Continue radiaplex.

## 2015-04-06 ENCOUNTER — Ambulatory Visit
Admission: RE | Admit: 2015-04-06 | Discharge: 2015-04-06 | Disposition: A | Discharge status: Home / Self Care | Payer: BLUE CROSS/BLUE SHIELD | Source: Ambulatory Visit | Attending: Radiation Oncology | Admitting: Radiation Oncology

## 2015-04-06 ENCOUNTER — Ambulatory Visit: Payer: BLUE CROSS/BLUE SHIELD

## 2015-04-06 DIAGNOSIS — C50912 Malignant neoplasm of unspecified site of left female breast: Secondary | ICD-10-CM | POA: Diagnosis not present

## 2015-04-07 ENCOUNTER — Ambulatory Visit
Admission: RE | Admit: 2015-04-07 | Discharge: 2015-04-07 | Disposition: A | Discharge status: Home / Self Care | Payer: BLUE CROSS/BLUE SHIELD | Source: Ambulatory Visit | Attending: Radiation Oncology | Admitting: Radiation Oncology

## 2015-04-07 ENCOUNTER — Ambulatory Visit: Payer: BLUE CROSS/BLUE SHIELD

## 2015-04-07 DIAGNOSIS — C50912 Malignant neoplasm of unspecified site of left female breast: Secondary | ICD-10-CM | POA: Diagnosis not present

## 2015-04-08 ENCOUNTER — Ambulatory Visit
Admission: RE | Admit: 2015-04-08 | Discharge: 2015-04-08 | Disposition: A | Discharge status: Home / Self Care | Payer: BLUE CROSS/BLUE SHIELD | Source: Ambulatory Visit | Attending: Radiation Oncology | Admitting: Radiation Oncology

## 2015-04-08 ENCOUNTER — Ambulatory Visit: Payer: BLUE CROSS/BLUE SHIELD

## 2015-04-08 DIAGNOSIS — C50912 Malignant neoplasm of unspecified site of left female breast: Secondary | ICD-10-CM | POA: Diagnosis not present

## 2015-04-11 ENCOUNTER — Ambulatory Visit
Admission: RE | Admit: 2015-04-11 | Discharge: 2015-04-11 | Disposition: A | Discharge status: Home / Self Care | Payer: BLUE CROSS/BLUE SHIELD | Source: Ambulatory Visit | Attending: Radiation Oncology | Admitting: Radiation Oncology

## 2015-04-11 ENCOUNTER — Ambulatory Visit: Payer: BLUE CROSS/BLUE SHIELD

## 2015-04-11 ENCOUNTER — Encounter: Payer: Self-pay | Admitting: Radiation Oncology

## 2015-04-11 VITALS — BP 101/70 | HR 76 | Temp 98.1°F | Resp 18 | Ht 61.0 in | Wt 164.6 lb

## 2015-04-11 DIAGNOSIS — C50412 Malignant neoplasm of upper-outer quadrant of left female breast: Secondary | ICD-10-CM

## 2015-04-11 DIAGNOSIS — C50912 Malignant neoplasm of unspecified site of left female breast: Secondary | ICD-10-CM | POA: Diagnosis not present

## 2015-04-11 NOTE — Progress Notes (Signed)
Weekly Management Note    ICD-9-CM ICD-10-CM   1. Breast cancer of upper-outer quadrant of left female breast (HCC) 174.4 C50.412     Current Dose: 28.8 Gy  Projected Dose: 61 Gy   Narrative:  The patient presents for routine under treatment assessment.  CBCT/MVCT images/Port film x-rays were reviewed.  The chart was checked. No complaints. Using radiaplex.   Physical Findings: Weight: 164 lb 9.6 oz (74.662 kg). Mild erythema over left breast.  Impression:  The patient is tolerating radiation.  Plan:  Continue treatment as planned. Continue radiaplex.  -----------------------------------  Eppie Gibson, MD

## 2015-04-11 NOTE — Progress Notes (Signed)
Shirley Howard has received 16 fractions to her left breast. Denies pain today to her left breast has some ocassional sharp pain. Skin to left breast some tanning using Radiaplex gel as directed. Appetite is good Energy level is less as the day goes on usually takes a nap some days. BP 101/70 mmHg  Pulse 76  Temp(Src) 98.1 F (36.7 C) (Oral)  Resp 18  Ht 5\' 1"  (1.549 m)  Wt 164 lb 9.6 oz (74.662 kg)  BMI 31.12 kg/m2  SpO2 100%

## 2015-04-12 ENCOUNTER — Ambulatory Visit
Admission: RE | Admit: 2015-04-12 | Discharge: 2015-04-12 | Disposition: A | Discharge status: Home / Self Care | Payer: BLUE CROSS/BLUE SHIELD | Source: Ambulatory Visit | Attending: Radiation Oncology | Admitting: Radiation Oncology

## 2015-04-12 ENCOUNTER — Ambulatory Visit: Payer: BLUE CROSS/BLUE SHIELD

## 2015-04-12 DIAGNOSIS — C50912 Malignant neoplasm of unspecified site of left female breast: Secondary | ICD-10-CM | POA: Diagnosis not present

## 2015-04-13 ENCOUNTER — Ambulatory Visit
Admission: RE | Admit: 2015-04-13 | Discharge: 2015-04-13 | Disposition: A | Discharge status: Home / Self Care | Payer: BLUE CROSS/BLUE SHIELD | Source: Ambulatory Visit | Attending: Radiation Oncology | Admitting: Radiation Oncology

## 2015-04-13 ENCOUNTER — Ambulatory Visit: Payer: BLUE CROSS/BLUE SHIELD

## 2015-04-13 DIAGNOSIS — C50912 Malignant neoplasm of unspecified site of left female breast: Secondary | ICD-10-CM | POA: Diagnosis not present

## 2015-04-14 ENCOUNTER — Other Ambulatory Visit: Payer: BLUE CROSS/BLUE SHIELD

## 2015-04-14 ENCOUNTER — Ambulatory Visit
Admission: RE | Admit: 2015-04-14 | Discharge: 2015-04-14 | Disposition: A | Discharge status: Home / Self Care | Payer: BLUE CROSS/BLUE SHIELD | Source: Ambulatory Visit | Attending: Radiation Oncology | Admitting: Radiation Oncology

## 2015-04-14 ENCOUNTER — Ambulatory Visit: Payer: BLUE CROSS/BLUE SHIELD | Admitting: Hematology

## 2015-04-14 ENCOUNTER — Ambulatory Visit: Payer: BLUE CROSS/BLUE SHIELD

## 2015-04-14 DIAGNOSIS — C50912 Malignant neoplasm of unspecified site of left female breast: Secondary | ICD-10-CM | POA: Diagnosis not present

## 2015-04-15 ENCOUNTER — Ambulatory Visit
Admission: RE | Admit: 2015-04-15 | Discharge: 2015-04-15 | Disposition: A | Discharge status: Home / Self Care | Payer: BLUE CROSS/BLUE SHIELD | Source: Ambulatory Visit | Attending: Radiation Oncology | Admitting: Radiation Oncology

## 2015-04-15 ENCOUNTER — Ambulatory Visit: Payer: BLUE CROSS/BLUE SHIELD

## 2015-04-15 DIAGNOSIS — C50912 Malignant neoplasm of unspecified site of left female breast: Secondary | ICD-10-CM | POA: Diagnosis not present

## 2015-04-18 ENCOUNTER — Ambulatory Visit: Payer: BLUE CROSS/BLUE SHIELD

## 2015-04-18 ENCOUNTER — Ambulatory Visit
Admission: RE | Admit: 2015-04-18 | Discharge: 2015-04-18 | Disposition: A | Discharge status: Home / Self Care | Payer: BLUE CROSS/BLUE SHIELD | Source: Ambulatory Visit | Attending: Radiation Oncology | Admitting: Radiation Oncology

## 2015-04-18 DIAGNOSIS — C50912 Malignant neoplasm of unspecified site of left female breast: Secondary | ICD-10-CM | POA: Diagnosis not present

## 2015-04-19 ENCOUNTER — Ambulatory Visit: Payer: BLUE CROSS/BLUE SHIELD

## 2015-04-19 ENCOUNTER — Ambulatory Visit
Admission: RE | Admit: 2015-04-19 | Discharge: 2015-04-19 | Disposition: A | Discharge status: Home / Self Care | Payer: BLUE CROSS/BLUE SHIELD | Source: Ambulatory Visit | Attending: Radiation Oncology | Admitting: Radiation Oncology

## 2015-04-19 ENCOUNTER — Ambulatory Visit: Payer: BLUE CROSS/BLUE SHIELD | Admitting: Radiation Oncology

## 2015-04-19 DIAGNOSIS — C50912 Malignant neoplasm of unspecified site of left female breast: Secondary | ICD-10-CM | POA: Diagnosis not present

## 2015-04-19 DIAGNOSIS — C50412 Malignant neoplasm of upper-outer quadrant of left female breast: Secondary | ICD-10-CM

## 2015-04-19 NOTE — Progress Notes (Signed)
Weekly Management Note Current Dose: 34.6  Gy  Projected Dose: 61 Gy   Narrative:  The patient presents for routine under treatment assessment.  CBCT/MVCT images/Port film x-rays were reviewed.  The chart was checked. Doing well. No complaints. Verified on treatment machine.   Physical Findings: Minimal skin changes.   Impression:  The patient is tolerating radiation.  Plan:  Continue treatment as planned. Continue radiaplex. OK to proceed with boost.

## 2015-04-20 ENCOUNTER — Ambulatory Visit: Payer: BLUE CROSS/BLUE SHIELD

## 2015-04-20 ENCOUNTER — Ambulatory Visit
Admission: RE | Admit: 2015-04-20 | Discharge: 2015-04-20 | Disposition: A | Discharge status: Home / Self Care | Payer: BLUE CROSS/BLUE SHIELD | Source: Ambulatory Visit | Attending: Radiation Oncology | Admitting: Radiation Oncology

## 2015-04-20 DIAGNOSIS — C50912 Malignant neoplasm of unspecified site of left female breast: Secondary | ICD-10-CM | POA: Diagnosis not present

## 2015-04-21 ENCOUNTER — Ambulatory Visit
Admission: RE | Admit: 2015-04-21 | Discharge: 2015-04-21 | Disposition: A | Discharge status: Home / Self Care | Payer: BLUE CROSS/BLUE SHIELD | Source: Ambulatory Visit | Attending: Radiation Oncology | Admitting: Radiation Oncology

## 2015-04-21 ENCOUNTER — Ambulatory Visit: Payer: BLUE CROSS/BLUE SHIELD

## 2015-04-21 DIAGNOSIS — C50912 Malignant neoplasm of unspecified site of left female breast: Secondary | ICD-10-CM | POA: Diagnosis not present

## 2015-04-22 ENCOUNTER — Ambulatory Visit
Admission: RE | Admit: 2015-04-22 | Discharge: 2015-04-22 | Disposition: A | Discharge status: Home / Self Care | Payer: BLUE CROSS/BLUE SHIELD | Source: Ambulatory Visit | Attending: Radiation Oncology | Admitting: Radiation Oncology

## 2015-04-22 ENCOUNTER — Ambulatory Visit: Payer: BLUE CROSS/BLUE SHIELD

## 2015-04-22 DIAGNOSIS — C50912 Malignant neoplasm of unspecified site of left female breast: Secondary | ICD-10-CM | POA: Diagnosis not present

## 2015-04-25 ENCOUNTER — Ambulatory Visit: Payer: BLUE CROSS/BLUE SHIELD

## 2015-04-25 ENCOUNTER — Ambulatory Visit
Admission: RE | Admit: 2015-04-25 | Discharge: 2015-04-25 | Disposition: A | Discharge status: Home / Self Care | Payer: BLUE CROSS/BLUE SHIELD | Source: Ambulatory Visit | Attending: Radiation Oncology | Admitting: Radiation Oncology

## 2015-04-25 DIAGNOSIS — C50912 Malignant neoplasm of unspecified site of left female breast: Secondary | ICD-10-CM | POA: Diagnosis not present

## 2015-04-26 ENCOUNTER — Ambulatory Visit
Admission: RE | Admit: 2015-04-26 | Discharge: 2015-04-26 | Disposition: A | Discharge status: Home / Self Care | Payer: BLUE CROSS/BLUE SHIELD | Source: Ambulatory Visit | Attending: Radiation Oncology | Admitting: Radiation Oncology

## 2015-04-26 ENCOUNTER — Encounter: Payer: Self-pay | Admitting: Radiation Oncology

## 2015-04-26 ENCOUNTER — Ambulatory Visit: Payer: BLUE CROSS/BLUE SHIELD

## 2015-04-26 VITALS — BP 114/71 | HR 62 | Resp 16 | Wt 161.2 lb

## 2015-04-26 DIAGNOSIS — C50912 Malignant neoplasm of unspecified site of left female breast: Secondary | ICD-10-CM | POA: Diagnosis not present

## 2015-04-26 DIAGNOSIS — C50412 Malignant neoplasm of upper-outer quadrant of left female breast: Secondary | ICD-10-CM | POA: Insufficient documentation

## 2015-04-26 DIAGNOSIS — Z923 Personal history of irradiation: Secondary | ICD-10-CM | POA: Diagnosis not present

## 2015-04-26 MED ORDER — SONAFINE EX EMUL
1.0000 "application " | Freq: Two times a day (BID) | CUTANEOUS | Status: DC
  Administered 2015-04-26: 1 via TOPICAL
  Filled 2015-04-26: qty 45

## 2015-04-26 NOTE — Progress Notes (Signed)
Weekly Management Note Current Dose: 49  Gy  Projected Dose: 61 Gy   Narrative:  The patient presents for routine under treatment assessment.  CBCT/MVCT images/Port film x-rays were reviewed.  The chart was checked. Doing well. Radiaplex not helping with itching.   Physical Findings: Pink skin. No moist desquamation.   Impression:  The patient is tolerating radiation.  Plan:  Continue treatment as planned. Switch to sonafine.

## 2015-04-26 NOTE — Progress Notes (Signed)
Weight and vitals stable. Reports occasional sharp shooting pains in her left breast but, understands this is related to the effects of surgery. Hyperpigmentation without desquamation noted left breast. Reports skin is starting to feel "itchy" within treatment field.  Denies nipple discharge. No evidence of lymphedema in left arm noted. Reports fatigue. Provided patient with additional tube of radiaplex.   BP 114/71 mmHg  Pulse 62  Resp 16  Wt 161 lb 3.2 oz (73.12 kg)  SpO2 100% Wt Readings from Last 3 Encounters:  04/26/15 161 lb 3.2 oz (73.12 kg)  04/11/15 164 lb 9.6 oz (74.662 kg)  04/05/15 164 lb 12.8 oz (74.753 kg)

## 2015-04-27 ENCOUNTER — Ambulatory Visit: Payer: BLUE CROSS/BLUE SHIELD

## 2015-04-27 ENCOUNTER — Ambulatory Visit
Admission: RE | Admit: 2015-04-27 | Discharge: 2015-04-27 | Disposition: A | Discharge status: Home / Self Care | Payer: BLUE CROSS/BLUE SHIELD | Source: Ambulatory Visit | Attending: Radiation Oncology | Admitting: Radiation Oncology

## 2015-04-27 DIAGNOSIS — C50912 Malignant neoplasm of unspecified site of left female breast: Secondary | ICD-10-CM | POA: Diagnosis not present

## 2015-04-28 ENCOUNTER — Ambulatory Visit: Payer: BLUE CROSS/BLUE SHIELD

## 2015-04-28 ENCOUNTER — Ambulatory Visit
Admission: RE | Admit: 2015-04-28 | Discharge: 2015-04-28 | Disposition: A | Discharge status: Home / Self Care | Payer: BLUE CROSS/BLUE SHIELD | Source: Ambulatory Visit | Attending: Radiation Oncology | Admitting: Radiation Oncology

## 2015-04-28 DIAGNOSIS — C50912 Malignant neoplasm of unspecified site of left female breast: Secondary | ICD-10-CM | POA: Diagnosis not present

## 2015-04-29 ENCOUNTER — Ambulatory Visit: Payer: BLUE CROSS/BLUE SHIELD

## 2015-04-29 ENCOUNTER — Ambulatory Visit
Admission: RE | Admit: 2015-04-29 | Discharge: 2015-04-29 | Disposition: A | Discharge status: Home / Self Care | Payer: BLUE CROSS/BLUE SHIELD | Source: Ambulatory Visit | Attending: Radiation Oncology | Admitting: Radiation Oncology

## 2015-04-29 DIAGNOSIS — C50912 Malignant neoplasm of unspecified site of left female breast: Secondary | ICD-10-CM | POA: Diagnosis not present

## 2015-05-02 ENCOUNTER — Ambulatory Visit
Admission: RE | Admit: 2015-05-02 | Discharge: 2015-05-02 | Disposition: A | Discharge status: Home / Self Care | Payer: BLUE CROSS/BLUE SHIELD | Source: Ambulatory Visit | Attending: Radiation Oncology | Admitting: Radiation Oncology

## 2015-05-02 ENCOUNTER — Ambulatory Visit: Payer: BLUE CROSS/BLUE SHIELD

## 2015-05-02 DIAGNOSIS — C50912 Malignant neoplasm of unspecified site of left female breast: Secondary | ICD-10-CM | POA: Diagnosis not present

## 2015-05-03 ENCOUNTER — Encounter: Payer: Self-pay | Admitting: Radiation Oncology

## 2015-05-03 ENCOUNTER — Ambulatory Visit: Payer: BLUE CROSS/BLUE SHIELD

## 2015-05-03 ENCOUNTER — Ambulatory Visit
Admission: RE | Admit: 2015-05-03 | Discharge: 2015-05-03 | Disposition: A | Discharge status: Home / Self Care | Payer: BLUE CROSS/BLUE SHIELD | Source: Ambulatory Visit | Attending: Radiation Oncology | Admitting: Radiation Oncology

## 2015-05-03 VITALS — BP 111/70 | HR 65 | Resp 16 | Wt 162.2 lb

## 2015-05-03 DIAGNOSIS — C50412 Malignant neoplasm of upper-outer quadrant of left female breast: Secondary | ICD-10-CM

## 2015-05-03 DIAGNOSIS — C50912 Malignant neoplasm of unspecified site of left female breast: Secondary | ICD-10-CM | POA: Diagnosis not present

## 2015-05-03 NOTE — Progress Notes (Signed)
Weight and vitals stable. Reports occasional sharp shooting pains in her left breast but, understands this is related to the effects of surgery. Hyperpigmentation without desquamation noted left breast. Reports skin is starting to feel "itchy" within treatment field. Denies nipple discharge. No evidence of lymphedema in left arm noted. Reports fatigue. Provided patient with sonafine. Encouraged patient to continue to use sonafine bid for two weeks s/p completion. Provided patient with one month follow up appointment card. Reviewed FYNN, ABC, and survivorship flyers with patient.  BP 111/70 mmHg  Pulse 65  Resp 16  Wt 162 lb 3.2 oz (73.573 kg)  SpO2 100% Wt Readings from Last 3 Encounters:  05/03/15 162 lb 3.2 oz (73.573 kg)  04/26/15 161 lb 3.2 oz (73.12 kg)  04/11/15 164 lb 9.6 oz (74.662 kg)

## 2015-05-03 NOTE — Progress Notes (Signed)
Weekly Management Note Current Dose: 59  Gy  Projected Dose: 61 Gy   Narrative:  The patient presents for routine under treatment assessment.  CBCT/MVCT images/Port film x-rays were reviewed.  The chart was checked. Itching over skin. Using sonafine. appt with medic on in 1 week.   Physical Findings: Weight: 162 lb 3.2 oz (73.573 kg). Pink skin. Some healing in medial breast.  Impression:  The patient is tolerating radiation.  Plan:  Continue treatment as planned. Discussed post RT skin care. Discussed survivorship. Follow up in 1 month.

## 2015-05-04 ENCOUNTER — Telehealth: Payer: Self-pay | Admitting: *Deleted

## 2015-05-04 ENCOUNTER — Telehealth: Payer: Self-pay | Admitting: Nurse Practitioner

## 2015-05-04 ENCOUNTER — Encounter: Payer: Self-pay | Admitting: Radiation Oncology

## 2015-05-04 ENCOUNTER — Ambulatory Visit
Admission: RE | Admit: 2015-05-04 | Discharge: 2015-05-04 | Disposition: A | Discharge status: Home / Self Care | Payer: BLUE CROSS/BLUE SHIELD | Source: Ambulatory Visit | Attending: Radiation Oncology | Admitting: Radiation Oncology

## 2015-05-04 ENCOUNTER — Ambulatory Visit: Payer: BLUE CROSS/BLUE SHIELD

## 2015-05-04 ENCOUNTER — Other Ambulatory Visit: Payer: Self-pay | Admitting: Adult Health

## 2015-05-04 DIAGNOSIS — C50412 Malignant neoplasm of upper-outer quadrant of left female breast: Secondary | ICD-10-CM

## 2015-05-04 DIAGNOSIS — C50912 Malignant neoplasm of unspecified site of left female breast: Secondary | ICD-10-CM | POA: Diagnosis not present

## 2015-05-04 NOTE — Telephone Encounter (Signed)
cld & spoke to pt and gave pt time & dste of appt for 5/25@1 :30

## 2015-05-04 NOTE — Telephone Encounter (Signed)
Left message for a return phone call to follow up after radiation completion.  

## 2015-05-09 NOTE — Progress Notes (Signed)
  Radiation Oncology         [phone] ________________________________  Name: Shirley Howard MRN: [id number]  Date: 05/04/2015  DOB: 25-Mar-1960  End of Treatment Note  Diagnosis:   Breast cancer of upper-outer quadrant of left female breast [facility])   Staging form: Breast, AJCC 7th Edition     Clinical stage from 11/10/2014: Stage IIA (T2, N0, M0) - Signed by Truitt Merle, MD on 11/10/2014     Pathologic stage from 11/18/2014: Stage IB (T1c, N55mi, cM0) - Signed by Enid Cutter, MD on 12/01/2014       Staging comments: Staged on lumpectomy specimen by Dr. Tresa Moore       Indication for treatment:  Curative      Radiation treatment dates:   03/21/2015-05/04/2015  Site/dose:   Left breast/ 45 Gy at 1.8 Gy per fraction x 25 fractions.  Left breast boost/ 16 Gy at 2 Gy per fraction x 8 fractions  Beams/energy:  Opposed tangents with reduced fields / 6 MV photons Enface electrons / 12 MeV  Narrative: The patient tolerated radiation treatment relatively well.   She had minimal skin irritation and dermatitis.  She had minimal fatigue.   Plan: The patient has completed radiation treatment. The patient will return to radiation oncology clinic for routine followup in one month. I advised them to call or return sooner if they have any questions or concerns related to their recovery or treatment.  ------------------------------------------------  Thea Silversmith, MD

## 2015-05-09 NOTE — Progress Notes (Signed)
Name: Shirley Howard   MRN: [id number]  Date:  04/05/2015   DOB: 1960-12-27  Status:outpatient    DIAGNOSIS: Breast cancer of upper-outer quadrant of left female breast [facility])   Staging form: Breast, AJCC 7th Edition     Clinical stage from 11/10/2014: Stage IIA (T2, N0, M0) - Signed by Truitt Merle, MD on 11/10/2014     Pathologic stage from 11/18/2014: Stage IB (T1c, N52mi, cM0) - Signed by Enid Cutter, MD on 12/01/2014       Staging comments: Staged on lumpectomy specimen by Dr. Tresa Moore    CONSENT VERIFIED: yes   SET UP: Patient is setup supine   IMMOBILIZATION:  The following immobilization was used:Custom Moldable Pillow, breast board.   NARRATIVE: Shirley Howard underwent complex simulation and treatment planning for her boost treatment today.  Her tumor volume was outlined on the planning CT scan. The depth of her cavity was felt to be appropriate for treatment with electrons    12  MeV electrons will be prescribed to the 100%  isodose line.   I personally oversaw and approved the construction of a unique block which will be used for beam modification purposes.  An isodose plan is requested.

## 2015-05-17 ENCOUNTER — Other Ambulatory Visit (HOSPITAL_BASED_OUTPATIENT_CLINIC_OR_DEPARTMENT_OTHER): Payer: BLUE CROSS/BLUE SHIELD

## 2015-05-17 ENCOUNTER — Telehealth: Payer: Self-pay | Admitting: Hematology

## 2015-05-17 ENCOUNTER — Encounter: Payer: Self-pay | Admitting: Hematology

## 2015-05-17 ENCOUNTER — Ambulatory Visit (HOSPITAL_BASED_OUTPATIENT_CLINIC_OR_DEPARTMENT_OTHER): Payer: BLUE CROSS/BLUE SHIELD | Admitting: Hematology

## 2015-05-17 ENCOUNTER — Encounter: Payer: Self-pay | Admitting: *Deleted

## 2015-05-17 VITALS — HR 60 | Temp 97.5°F | Resp 18 | Wt 162.2 lb

## 2015-05-17 DIAGNOSIS — C50412 Malignant neoplasm of upper-outer quadrant of left female breast: Secondary | ICD-10-CM

## 2015-05-17 DIAGNOSIS — N951 Menopausal and female climacteric states: Secondary | ICD-10-CM | POA: Diagnosis not present

## 2015-05-17 DIAGNOSIS — D6481 Anemia due to antineoplastic chemotherapy: Secondary | ICD-10-CM | POA: Diagnosis not present

## 2015-05-17 LAB — CBC WITH DIFFERENTIAL/PLATELET
BASO%: 0.4 % (ref 0.0–2.0)
BASOS ABS: 0 10*3/uL (ref 0.0–0.1)
EOS ABS: 0.4 10*3/uL (ref 0.0–0.5)
EOS%: 7.4 % — AB (ref 0.0–7.0)
HEMATOCRIT: 38.3 % (ref 34.8–46.6)
HEMOGLOBIN: 12.7 g/dL (ref 11.6–15.9)
LYMPH#: 1.2 10*3/uL (ref 0.9–3.3)
LYMPH%: 20.6 % (ref 14.0–49.7)
MCH: 29.5 pg (ref 25.1–34.0)
MCHC: 33.2 g/dL (ref 31.5–36.0)
MCV: 89.1 fL (ref 79.5–101.0)
MONO#: 0.4 10*3/uL (ref 0.1–0.9)
MONO%: 7.4 % (ref 0.0–14.0)
NEUT%: 64.2 % (ref 38.4–76.8)
NEUTROS ABS: 3.7 10*3/uL (ref 1.5–6.5)
Platelets: 233 10*3/uL (ref 145–400)
RBC: 4.3 10*6/uL (ref 3.70–5.45)
RDW: 13.4 % (ref 11.2–14.5)
WBC: 5.7 10*3/uL (ref 3.9–10.3)

## 2015-05-17 LAB — COMPREHENSIVE METABOLIC PANEL
ALT: 23 U/L (ref 0–55)
ANION GAP: 8 meq/L (ref 3–11)
AST: 21 U/L (ref 5–34)
Albumin: 4.1 g/dL (ref 3.5–5.0)
Alkaline Phosphatase: 76 U/L (ref 40–150)
BUN: 15.3 mg/dL (ref 7.0–26.0)
CALCIUM: 9.7 mg/dL (ref 8.4–10.4)
CHLORIDE: 103 meq/L (ref 98–109)
CO2: 29 meq/L (ref 22–29)
Creatinine: 0.8 mg/dL (ref 0.6–1.1)
EGFR: 82 mL/min/{1.73_m2} — ABNORMAL LOW (ref >90)
Glucose: 90 mg/dl (ref 70–140)
POTASSIUM: 4.1 meq/L (ref 3.5–5.1)
Sodium: 140 mEq/L (ref 136–145)
Total Bilirubin: 0.37 mg/dL (ref 0.20–1.20)
Total Protein: 7.2 g/dL (ref 6.4–8.3)

## 2015-05-17 MED ORDER — LETROZOLE 2.5 MG PO TABS: 2.5000 mg | ORAL_TABLET | Freq: Every day | ORAL | Status: DC

## 2015-05-17 NOTE — Progress Notes (Signed)
[place]  [phone] [phone]  Clinic follow up Note   Patient Care Team: Lupita Dawn, MD as PCP - General (Family Medicine) Stark Klein, MD as Consulting Physician (General Surgery) Truitt Merle, MD as Consulting Physician (Hematology) Arloa Koh, MD as Consulting Physician (Radiation Oncology) Rockwell Germany, RN as Registered Nurse Mauro Kaufmann, RN as Registered Nurse Sylvan Cheese, NP as Nurse Practitioner (Nurse Practitioner) 05/17/2015  CHIEF COMPLAINTS:  Follow up left breast cancer  Oncology History   Breast cancer of upper-outer quadrant of left female breast [facility])   Staging form: Breast, AJCC 7th Edition     Clinical stage from 11/10/2014: Stage IIA (T2, N0, M0) - Signed by Truitt Merle, MD on 11/10/2014     Pathologic stage from 11/18/2014: Stage IB (T1c, N74m, cM0) - Signed by JEnid Cutter MD on 12/01/2014       Staging comments: Staged on lumpectomy specimen by Dr. MTresa Moore         [place]ast [place] of [place]   10/28/2014 Mammogram Screening and the diagnostic mammogram shows a 2.1 x 1.3 x 2.1 cm it irregular mass in the left breast 2:00 position, 2 cm from the nipple.   11/01/2014 Receptors her2 ER 100%, PR100%+, HER2-, KI67 60%    11/01/2014 Initial Biopsy left breast biopsy showed invasive ductal carcinoma, grade 2, and DCIS    11/04/2014 Initial Diagnosis Breast cancer of upper-[place] quadrant of [place] female breast [place]   11/16/2014 Oncotype testing RS 30, predicts 20% 10-y risk of distant recurrence with tamoxifen alone    11/16/2014 Surgery left breast lumpectomy iwth SLN biopsy.    11/16/2014 Pathology Results left breast invasive ductal carcinoma, G3, 1 out of 3 SLN had micrometastasis, (-)LVI, margines negative,  (+) high grade DCIS   12/15/2014 - 02/17/2015 Adjuvant Chemotherapy Docetaxel 75 mg/m, Cytoxan 600 mg/m, every 3 weeks,  total 4 cycles.   03/21/2015 - 05/04/2015  Radiation Therapy adjuvant breast radiation     HISTORY OF PRESENTING ILLNESS:  Shirley Howard 55y.o. female is here because of her recently diagnosed left breast cancer. She is accompanied by her husband, daughter, mother to our multidisciplinary breast clinic today.  She felt a lump in her left breast one  Month ago, mild tender, she otherwise feels well no other new symptoms. This was developed by her primary care physician and screening mammogram was obtained. Mammogram reviewed a 2.1 cm mass in the left breast 2:00 position, core needle biopsy showed invasive ductal carcinoma, and DCIS.  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 She feels well overall. She does have mild low back pain, does not take any education. She is physically active at home, but does not exercise regularly. No family history of breast cancer. She was on hormone replacement for the past 8 years, and just stopped progesterone a few weeks ago.  CURRENT THERAPY: Pending adjuvant aromatase inhibitor  INTERIM HISTORY  Mrs Schauer returns for follow up. She has recently finished breast radiation about 2 weeks ago. She tolerated very well, with mild dermatitis, no other significant side effects. She feels well overall, denies any other significant pain or other symptoms.  MEDICAL HISTORY:  Past Medical History  Diagnosis Date  . Wrist pain   . Sore throat 10/27/2012    [facility] vocal cord polyp left, bilateral edema, significant postcricoid edema, c/w chronic tobacco use and chronic reflux. -  Recheck 2 months - Encouraged tobacco cessation to reduce risk of laryngeal cancer     . Anxiety   . Depression   . Hot flashes   . GERD (gastroesophageal reflux disease)   . Breast cancer ([place])   . Tubular adenoma of colon 03/2010    SURGICAL HISTORY: Past Surgical History  Procedure Laterality Date  . Cyst removal hand    . Tubal ligation  1995  . Breast lumpectomy  with axillary lymph node biopsy Left 11/16/2014    Procedure: LEFT BREAST LUMPECTOMY WITH AXILLARY LYMPH NODE BIOPSY;  Surgeon: Stark Klein, MD;  Location: [place];  Service: General;  Laterality: Left;   GYN HISTORY  Menarchal: 10 LMP: 48 Contraceptive: 16 HRT: 8 years, stopped in 10/2014  G2P2:    SOCIAL HISTORY: Social History   Social History  . Marital Status: Married    Spouse Name: N/A  . Number of Children: N/A  . Years of Education: N/A   Occupational History  . Not on file.   Social History Main Topics  . Smoking status: Former Smoker -- 1.50 packs/day for 40 years    Types: Cigarettes    Quit date: 11/04/2012  . Smokeless tobacco: Never Used  . Alcohol Use: Yes     Comment: social users   . Drug Use: No  . Sexual Activity:    Partners: Male     Comment: Husband   Other Topics Concern  . Not on file   Social History Narrative   Lives with husband in [place].   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 Supraventricular tachycardia Mother   . Thyroid disease Mother 91    unsure what type   . Cancer Father     throat cancer - smoker  . Stroke Father     ALLERGIES:  is allergic to codeine.  MEDICATIONS:  Current Outpatient Prescriptions  Medication Sig Dispense Refill  . aspirin 81 MG tablet Take 81 mg by mouth daily.    . calcium carbonate (OS-CAL) 600 MG TABS tablet Take 600 mg by mouth daily with breakfast.    . cholecalciferol (VITAMIN D) 1000 UNITS tablet Take 2,000 Units by mouth daily.    . diphenhydrAMINE (SOMINEX) 25 MG tablet Take 50 mg by mouth at bedtime as needed for sleep.     Marland Kitchen EVENING PRIMROSE OIL PO Take 1,300 mg by mouth daily.    . fish oil-omega-3 fatty acids 1000 MG capsule Take 2 g by mouth daily.    . fluticasone (FLONASE) 50  MCG/ACT nasal spray Place 2 sprays into both nostrils daily. 16 g 0  . Inulin (PHILLIPS FIBER GOOD PO) Take by mouth.    . Multiple Vitamin (MULTIVITAMIN) tablet Take 1 tablet by mouth daily.    Marland Kitchen omeprazole (PRILOSEC) 40 MG capsule Take one tablet daily.    Marland Kitchen letrozole (FEMARA) 2.5 MG tablet Take 1 tablet (2.5 mg total) by mouth daily. 30 tablet 3   No current facility-administered medications for this visit.    REVIEW OF SYSTEMS:   Constitutional: Denies fevers, chills or abnormal night sweats Eyes: Denies blurriness of vision, double vision or watery eyes Ears, nose, mouth, throat, and face: Denies mucositis or sore throat Respiratory: Denies cough, dyspnea or  wheezes Cardiovascular: Denies palpitation, chest discomfort or lower extremity swelling Gastrointestinal:  Denies nausea, heartburn or change in bowel habits Skin: Denies abnormal skin rashes Lymphatics: Denies new lymphadenopathy or easy bruising Neurological:Denies numbness, tingling or new weaknesses Behavioral/Psych: Mood is stable, no new changes  All other systems were reviewed with the patient and are negative.  PHYSICAL EXAMINATION: ECOG PERFORMANCE STATUS: 0 - Asymptomatic  Filed Vitals:   05/17/15 1306  Pulse: 60  Temp: 97.5 F (36.4 C)  Resp: 18   Filed Weights   05/17/15 1306  Weight: 162 lb 3.2 oz (73.573 kg)    GENERAL:alert, no distress and comfortable SKIN: skin color, texture, turgor are normal, there are a few scattered small skin rash on her scalp EYES: normal, conjunctiva are pink and non-injected, sclera clear OROPHARYNX:no exudate, no erythema and lips, buccal mucosa, and tongue normal  NECK: supple, thyroid normal size, non-tender, without nodularity LYMPH:  no palpable lymphadenopathy in the cervical, axillary or inguinal LUNGS: clear to auscultation and percussion with normal breathing effort HEART: regular rate & rhythm and no murmurs and no lower extremity edema ABDOMEN:abdomen soft,  non-tender and normal bowel sounds Musculoskeletal:no cyanosis of digits and no clubbing  PSYCH: alert & oriented x 3 with fluent speech NEURO: no focal motor/sensory deficits Breasts: Breast inspection showed them to be symmetrical with no nipple discharge. The left breast incision has healed well There is fullness underneath the surgical incision, likely surgical scar or seroma. Palpation of the right breast and bilateral axilla with showed no palpable mass or adenopathy.  LABORATORY DATA:  I have reviewed the data as listed CBC Latest Ref Rng 05/17/2015 02/17/2015 01/26/2015  WBC 3.9 - 10.3 10e3/uL 5.7 26.0(H) 29.2(H)  Hemoglobin 11.6 - 15.9 g/dL 12.7 11.0(L) 11.5(L)  Hematocrit 34.8 - 46.6 % 38.3 32.7(L) 35.2  Platelets 145 - 400 10e3/uL 233 257 310     Recent Labs  01/26/15 0852 02/17/15 0857 05/17/15 1215  NA 138 137 140  K 3.8 4.1 4.1  CO2 '23 22 29  '$ GLUCOSE 140 125 90  BUN 20.6 19.5 15.3  CREATININE 0.8 0.7 0.8  CALCIUM 9.6 9.6 9.7  PROT 7.1 7.0 7.2  ALBUMIN 3.6 3.7 4.1  AST '19 20 21  '$ ALT '30 28 23  '$ ALKPHOS 90 73 76  BILITOT <0.30 0.31 0.37    Pathology report Diagnosis 1. Breast, lumpectomy, Left - INVASIVE DUCTAL CARCINOMA, GRADE 3, SPANNING 2 CM. - DUCTAL CARCINOMA IN SITU, HIGH GRADE. - RESECTION MARGINS ARE NEGATIVE FOR INVASIVE AND IN SITU CARCINOMA. - LOBULAR NEOPLASIA (ATYPICAL LOBULAR HYPERPLASIA). - SEE ONCOLOGY TABLE. 2. Lymph node, sentinel, biopsy, Left axillary #1 - ONE OF ONE LYMPH NODES NEGATIVE FOR CARCINOMA (0/1). 3. Lymph node, sentinel, biopsy, Left axillary #2 - MICROMETASTASIS IN ONE OF ONE LYMPH NODE (1/1). 4. Lymph node, sentinel, biopsy, Left axillary #3 - ONE OF ONE LYMPH NODES NEGATIVE FOR CARCINOMA (0/1).  Microscopic Comment 1. BREAST, INVASIVE TUMOR, WITH LYMPH NODES PRESENT Specimen, including laterality and lymph node sampling (sentinel, non-sentinel): Left breast lump and left axillary sentinel lymph nodes. Procedure: Left  breast lumpectomy and left axillary sentinel lymph node biopsies. Histologic type: Invasive ductal carcinoma. Grade: 3 Tubule formation: 3 Nuclear pleomorphism: 3 Mitotic: 2 Tumor size (gross measurement): 2 cm Margins: Invasive, distance to closest margin: 0.2 cm (inferior). In-situ, distance to closest margin: 0.4 cm (superior). If margin positive, focally or broadly: N/A. Lymphovascular invasion: Definitive invasion not identified. Ductal carcinoma in situ: Present. Grade: III Extensive intraductal component:  No. Lobular neoplasia: Present (atypical lobular hyperplasia). Tumor focality: Unifocal. Treatment effect: N/A. Extent of tumor: Confined to breast parenchyma. Lymph nodes: Examined: 3 Sentinel 0 Non-sentinel 3 Total Lymph nodes with metastasis: 1 Isolated tumor cells (< 0.2 mm): 0 Micrometastasis: (> 0.2 mm and < 2.0 mm): 1 Macrometastasis: (> 2.0 mm): 0 Extracapsular extension: No. Breast prognostic profile: Performed on biopsy ([id number]). Her2 will be repeated on current specimen. Estrogen receptor: Positive, strong staining intensity. Progesterone receptor: Positive, strong staining intensity. Her 2 neu: Negative. Ki-67: 60%. Non-neoplastic breast: Fibrocystic change. TNM: pT1c, pN19m  Results: HER2 - NEGATIVE RATIO OF HER2/CEP17 SIGNALS 1.15 AVERAGE HER2 COPY NUMBER PER CELL 3.15  Oncotype Dx RS: 30, predicts 20% 10-y risk of distant recurrence with tamoxifen alone   RADIOGRAPHIC STUDIES: I have personally reviewed the radiological images as listed and agreed with the findings in the report.  No new scans    ASSESSMENT & PLAN:  55year old Caucasian female, postmenopausal, presented with palpable left breast mass  1. Left breast invasive ductal carcinoma, [id number], stage IB, grade 3, ER positive, PR positive, HER-2 negative, (+) DCIS -I previously reviewed her surgical pathology results in great details with patient and her husband. -She is now  status post lumpectomy and sentinel lymph node biopsy. Surgical margins were negative. She has early-stage breast cancer, likely cured by complete surgical resection -I discussed the risk of cancer recurrence after her surgery. The Oncotype DX test results were explained to her in details. Her recurrence score is 30, which predicts 20% 10 year risk of distant recurrence with tamoxifen alone. She has received adjuvant chemotherapy with docetaxel and Cytoxan. -She has recently completed breast irradiation also, tolerated well overall. -Giving her strong ER/PR positivity, I recommend adjuvant endocrine therapy with aromatase inhibitor for 5-10 years. Potential benefits and side effects, which includes but not limited to, hot flash, skin and vaginal dryness, muscular and joint discomfort and stiffness, metabolic change such as hyperlipidemia, hyperglycemia, weight gain, mood frustration, osteoporosis, etc. were discussed with her in details. She agrees to proceed. -I cautery in letrozole 2.5 mg once daily to her pharmacy today, she'll start in a few days. -She will continue breast cancer surveillance, which including annual screening mammogram, self exam, routine follow-up and labs. -I encouraged her to have healthy diet, exercise regularly and consider weight loss -Her BMI is 30, she is probably eligible for the BWEL clinical trial, which is a diet and exercise program, she is interested, our research nurse GiBarnett Applebaumet her today.  2. Mild anemia -Secondary to chemotherapy, resolved now.    3. Anxiety and depression -stable. She takes xanax as needed.   5. GERD -She is on omeprazole, and takes Tums and night  -we discussed dexa around chemo may exacerbate her acid reflux.  6. Hot flush -She has moderate for hot flash, we discussed that letrozole may make it worse, we discussed the management strategy, including SSRI and the Neurontin. If her hot flash gets worse by letrozole, she is willing to try  Neurontin and 90.  Plan -Start letrozole 2.5 mg once daily in a few days, prescription was called in today -She will see survivorship clinic on 06/26/2015, I'll see her back in 4 months. -she will consider BWEL clinical trial   All questions were answered. The patient knows to call the clinic with any problems, questions or concerns. I spent 20 minutes counseling the patient face to face. The total time spent in the appointment was 25 minutes and more than 50%  was on counseling.     Truitt Merle, MD 05/17/2015

## 2015-05-17 NOTE — Telephone Encounter (Signed)
Gave and printed appt sched and avs for pt for Aug °

## 2015-05-27 NOTE — Progress Notes (Signed)
Shirley Howard is here for a one month for follow up visit for breast cancer of upper-outer quadrant left female breast.   Skin status:Left breat still with slight tanning. Lotion being used: Lotion with vitamin E. Have you seen your medical oncologist? Date If not ,when is appointment Dr. Burr Medico 09-12-15 ER+,have started AI or Tamoxifen? If not, why? 05-17-15 Letrozole Discuss survivorship appointment:5-25-17[name] Offer referral reading material for Survivorship, [employer] and [facility] given  06-02-15 Appetite:Good Pain:None Arm movement:Able to raise left arm without discomfort. Energy level:Having fatigue by the end of  the day.        BP 116/69 mmHg  Pulse 73  Temp(Src) 98 F (36.7 C) (Oral)  Resp 16  Ht 5\' 2"  (1.575 m)  Wt 165 lb (74.844 kg)  BMI 30.17 kg/m2  SpO2 98%

## 2015-05-31 ENCOUNTER — Encounter: Payer: Self-pay | Admitting: Gastroenterology

## 2015-05-31 ENCOUNTER — Ambulatory Visit (INDEPENDENT_AMBULATORY_CARE_PROVIDER_SITE_OTHER): Payer: BLUE CROSS/BLUE SHIELD | Admitting: Gastroenterology

## 2015-05-31 VITALS — BP 104/68 | HR 68 | Ht 62.0 in | Wt 163.1 lb

## 2015-05-31 DIAGNOSIS — Z86010 Personal history of colonic polyps: Secondary | ICD-10-CM

## 2015-05-31 DIAGNOSIS — K219 Gastro-esophageal reflux disease without esophagitis: Secondary | ICD-10-CM | POA: Diagnosis not present

## 2015-05-31 DIAGNOSIS — J029 Acute pharyngitis, unspecified: Secondary | ICD-10-CM | POA: Diagnosis not present

## 2015-05-31 MED ORDER — NA SULFATE-K SULFATE-MG SULF 17.5-3.13-1.6 GM/177ML PO SOLN: 1.0000 | Freq: Once | ORAL | Status: DC

## 2015-05-31 NOTE — Progress Notes (Signed)
    History of Present Illness: This is a 55 year old female referred by Shirley Dawn, MD for the evaluation of RUQ pain and frequent throat irritation. She previously underwent colonoscopy in 03/2010 showing one small adenomatous colon polyp. ENT evaluation starting 2014 thru recently has felt chronic sore throat was due to chronic tobacco use and chronic GERD. She stopped smoking in 2014. She notes rare episodes of heartburn and indigestion. She notes a throat irritation most days. She's not sure that omeprazole is helping. She has a history of left breast cancer status post left lumpectomy and she recently completed a course of chemoradiation therapy. She has an intermittent right upper quadrant soreness that is not related to any digestive function. She states it started after using a jet ski. Denies weight loss, constipation, diarrhea, change in stool caliber, melena, hematochezia, nausea, vomiting, dysphagia, chest pain.  Review of Systems: Pertinent positive and negative review of systems were noted in the above HPI section. All other review of systems were otherwise negative.  Current Medications, Allergies, Past Medical History, Past Surgical History, Family History and Social History were reviewed in [employer] record.  Physical Exam: General: Well developed, well nourished, no acute distress Head: Normocephalic and atraumatic Eyes:  sclerae anicteric, EOMI Ears: Normal auditory acuity Mouth: No deformity or lesions Neck: Supple, no masses or thyromegaly Lungs: Clear throughout to auscultation Heart: Regular rate and rhythm; no murmurs, rubs or bruits Abdomen: Soft, non tender and non distended. No masses, hepatosplenomegaly or hernias noted. Normal Bowel sounds Rectal: deferred to colonoscopy Musculoskeletal: Symmetrical with no gross deformities  Skin: No lesions on visible extremities Pulses:  Normal pulses noted Extremities: No clubbing, cyanosis, edema or  deformities noted Neurological: Alert oriented x 4, grossly nonfocal Cervical Nodes:  No significant cervical adenopathy Inguinal Nodes: No significant inguinal adenopathy Psychological:  Alert and cooperative. Normal mood and affect  Assessment and Recommendations:  1. Frequent sore throat, throat irritation. Possible GERD however she does not have any other significant reflux symptoms. Rule out esophagitis. Continue omeprazole 40 mg daily and TUMS prns. Follow all standard antireflux measures. Schedule EGD. The risks (including bleeding, perforation, infection, missed lesions, medication reactions and possible hospitalization or surgery if complications occur), benefits, and alternatives to endoscopy with possible biopsy and possible dilation were discussed with the patient and they consent to proceed. EGD is unremarkable consider trial of omeprazole 40 mg twice daily for 3 months.   2. Frequent right upper quadrant pain, soreness. Likely musculoskeletal. Further evaluation with EGD as above.  3. Personal history of adenomatous colon polyps. She is due for a five-year interval surveillance colonoscopy. Schedule colonoscopy. The risks (including bleeding, perforation, infection, missed lesions, medication reactions and possible hospitalization or surgery if complications occur), benefits, and alternatives to colonoscopy with possible biopsy and possible polypectomy were discussed with the patient and they consent to proceed.    cc: Shirley Dawn, MD [id number] [place], [place] [id number]

## 2015-05-31 NOTE — Patient Instructions (Signed)
You have been scheduled for an endoscopy and colonoscopy. Please follow the written instructions given to you at your visit today. Please pick up your prep supplies at the pharmacy within the next 1-3 days. If you use inhalers (even only as needed), please bring them with you on the day of your procedure. Your physician has requested that you go to www.startemmi.com and enter the access code given to you at your visit today. This web site gives a general overview about your procedure. However, you should still follow specific instructions given to you by our office regarding your preparation for the procedure.  Patient advised to avoid spicy, acidic, citrus, chocolate, mints, fruit and fruit juices.  Limit the intake of caffeine, alcohol and Soda.  Don't exercise too soon after eating.  Don't lie down within 3-4 hours of eating.  Elevate the head of your bed.  Normal BMI (Body Mass Index- based on height and weight) is between 19 and 25. Your BMI today is Body mass index is 29.83 kg/(m^2). Marland Kitchen Please consider follow up  regarding your BMI with your Primary Care Provider.  Thank you for choosing me and [facility].  Pricilla Riffle. Dagoberto Ligas., MD., Marval Regal

## 2015-06-02 ENCOUNTER — Ambulatory Visit
Admission: RE | Admit: 2015-06-02 | Discharge: 2015-06-02 | Disposition: A | Discharge status: Home / Self Care | Payer: BLUE CROSS/BLUE SHIELD | Source: Ambulatory Visit | Attending: Radiation Oncology | Admitting: Radiation Oncology

## 2015-06-02 ENCOUNTER — Encounter: Payer: Self-pay | Admitting: Radiation Oncology

## 2015-06-02 VITALS — BP 116/69 | HR 73 | Temp 98.0°F | Resp 16 | Ht 62.0 in | Wt 165.0 lb

## 2015-06-02 DIAGNOSIS — C50412 Malignant neoplasm of upper-outer quadrant of left female breast: Secondary | ICD-10-CM

## 2015-06-02 NOTE — Progress Notes (Signed)
   Department of Radiation Oncology  Phone:  [phone] Fax:        [phone]   Name: Shirley Howard MRN: [id number]  DOB: [birth year]  Date: 06/02/2015  Follow Up Visit Note  Diagnosis: Breast cancer of upper-outer quadrant of left female breast [facility])   Staging form: Breast, AJCC 7th Edition     Clinical stage from 11/10/2014: Stage IIA (T2, N0, M0) - Signed by Truitt Merle, MD on 11/10/2014     Pathologic stage from 11/18/2014: Stage IB (T1c, N11m, cM0) - Signed by JEnid Cutter MD on 12/01/2014       Staging comments: Staged on lumpectomy specimen by Dr. MTresa Moore Indication for treatment:  Curative      Radiation treatment dates:   03/21/2015-05/04/2015  Site/dose:   Left breast/ 45 Gy at 1.8 Gy per fraction x 25 fractions.  Left breast boost/ 16 Gy at 2 Gy per fraction x 8 fractions  Interval History:  Shirley Howard is here for a one month for follow up visit for breast cancer of upper-outer quadrant left female breast. The patient last met with Dr. FBurr Medicoon 05/17/15, she has an appointment scheduled for 09/12/15. She is currently on Letrozole, she is tolerating this well. Her appetite is good, she is able to raise her left arm without discomfort, she also has some fatigue by the end of the day. She using Vitamin E lotion on her breast.  She has a survivorship appointment scheduled for 06/30/15.   She e has been [facility]ful [facility]   Physical Exam:  Filed Vitals:   06/02/15 1645  BP: 116/69  Pulse: 73  Temp: 98 F (36.7 C)  TempSrc: Oral  Resp: 16  Height: '5\' 2"'$  (1.575 m)  Weight: 165 lb (74.844 kg)  SpO2: 98%   Skin is well healed with no hyperpigmentation or skin breakdown.   IMPRESSION: Shirley Howard a 55y.o. female diagnosed with pT1c, pN153mleft breast cancer. The patient is recovering well from the effects of radiation.   PLAN:  She is doing well. We discussed the need for follow up every 4-6 months which she has scheduled.  We  discussed the need for yearly mammograms which she can schedule with her OBGYN or with medical oncology. We discussed the need for sun protection in the treated area.  She can always call me with questions.  I will follow up with her on an as needed basis.   ------------------------------------------------  StThea SilversmithMD  This document serves as a record of services personally performed by StThea SilversmithMD. It was created on her behalf by AdDerek Mounda trained medical scribe. The creation of this record is based on the scribe's personal observations and the provider's statements to them. This document has been checked and approved by the attending provider.

## 2015-06-13 ENCOUNTER — Encounter: Payer: Self-pay | Admitting: Gastroenterology

## 2015-06-24 ENCOUNTER — Encounter: Payer: Self-pay | Admitting: Gastroenterology

## 2015-06-24 ENCOUNTER — Ambulatory Visit (AMBULATORY_SURGERY_CENTER): Payer: BLUE CROSS/BLUE SHIELD | Admitting: Gastroenterology

## 2015-06-24 VITALS — BP 121/76 | HR 63 | Temp 99.1°F | Resp 16 | Ht 62.0 in | Wt 163.0 lb

## 2015-06-24 DIAGNOSIS — J029 Acute pharyngitis, unspecified: Secondary | ICD-10-CM

## 2015-06-24 DIAGNOSIS — Z86010 Personal history of colonic polyps: Secondary | ICD-10-CM | POA: Diagnosis not present

## 2015-06-24 DIAGNOSIS — R1011 Right upper quadrant pain: Secondary | ICD-10-CM | POA: Diagnosis not present

## 2015-06-24 DIAGNOSIS — K2950 Unspecified chronic gastritis without bleeding: Secondary | ICD-10-CM | POA: Diagnosis not present

## 2015-06-24 DIAGNOSIS — K635 Polyp of colon: Secondary | ICD-10-CM

## 2015-06-24 DIAGNOSIS — D124 Benign neoplasm of descending colon: Secondary | ICD-10-CM

## 2015-06-24 DIAGNOSIS — K219 Gastro-esophageal reflux disease without esophagitis: Secondary | ICD-10-CM | POA: Diagnosis not present

## 2015-06-24 MED ORDER — SODIUM CHLORIDE 0.9 % IV SOLN: 500.0000 mL | INTRAVENOUS | Status: DC

## 2015-06-24 MED ORDER — OMEPRAZOLE 40 MG PO CPDR
DELAYED_RELEASE_CAPSULE | ORAL | Status: DC
Start: 2015-06-24 — End: 2016-06-05

## 2015-06-24 NOTE — Op Note (Signed)
[place] [facility] Patient Name: Shirley Howard Procedure Date: 06/24/2015 2:48 PM MRN: [id number] Endoscopist: Ladene Artist , MD Age: 55 Referring MD:  Date of Birth: Aug 18, 1960 Gender: Female Procedure:                Colonoscopy Indications:              Surveillance: Personal history of adenomatous                            polyps on last colonoscopy > 5 years ago Medicines:                Monitored Anesthesia Care Procedure:                Pre-Anesthesia Assessment:                           - Prior to the procedure, a History and Physical                            was performed, and patient medications and                            allergies were reviewed. The patient's tolerance of                            previous anesthesia was also reviewed. The risks                            and benefits of the procedure and the sedation                            options and risks were discussed with the patient.                            All questions were answered, and informed consent                            was obtained. Prior Anticoagulants: The patient has                            taken no previous anticoagulant or antiplatelet                            agents. ASA Grade Assessment: II - A patient with                            mild systemic disease. After reviewing the risks                            and benefits, the patient was deemed in                            satisfactory condition to undergo the procedure.  After obtaining informed consent, the colonoscope                            was passed under direct vision. Throughout the                            procedure, the patient's blood pressure, pulse, and                            oxygen saturations were monitored continuously. The                            Model [id number]-H190L [id number]) scope was introduced                            through the anus and advanced to the the  cecum,                            identified by appendiceal orifice and ileocecal                            valve. The ileocecal valve, appendiceal orifice,                            and rectum were photographed. The quality of the                            bowel preparation was excellent. The colonoscopy                            was performed without difficulty. The patient                            tolerated the procedure well. Scope In: 2:54:57 PM Scope Out: 3:12:44 PM Scope Withdrawal Time: 0 hours 13 minutes 43 seconds  Total Procedure Duration: 0 hours 17 minutes 47 seconds  Findings:                 A 7 mm polyp was found in the descending colon. The                            polyp was sessile. The polyp was removed with a                            cold snare. Resection and retrieval were complete.                           The exam was otherwise without abnormality on                            direct and retroflexion views. Complications:            No immediate complications. Estimated blood loss:  None. Estimated Blood Loss:     Estimated blood loss: none. Impression:               - One 7 mm polyp in the descending colon, removed                            with a cold snare. Resected and retrieved. Recommendation:           - Repeat colonoscopy in 5 years for surveillance.                           - Patient has a contact number available for                            emergencies. The signs and symptoms of potential                            delayed complications were discussed with the                            patient. Return to normal activities tomorrow.                            Written discharge instructions were provided to the                            patient.                           - Resume previous diet.                           - Continue present medications.                           - Await pathology results. Ladene Artist, MD 06/24/2015 3:15:34 PM This report has been signed electronically.

## 2015-06-24 NOTE — Patient Instructions (Addendum)
YOU HAD AN ENDOSCOPIC PROCEDURE TODAY AT THE [facility]:   Refer to the procedure report that was given to you for any specific questions about what was found during the examination.  If the procedure report does not answer your questions, please call your gastroenterologist to clarify.  If you requested that your care partner not be given the details of your procedure findings, then the procedure report has been included in a sealed envelope for you to review at your convenience later.  YOU SHOULD EXPECT: Some feelings of bloating in the abdomen. Passage of more gas than usual.  Walking can help get rid of the air that was put into your GI tract during the procedure and reduce the bloating. If you had a lower endoscopy (such as a colonoscopy or flexible sigmoidoscopy) you may notice spotting of blood in your stool or on the toilet paper. If you underwent a bowel prep for your procedure, you may not have a normal bowel movement for a few days.  Please Note:  You might notice some irritation and congestion in your nose or some drainage.  This is from the oxygen used during your procedure.  There is no need for concern and it should clear up in a day or so.  SYMPTOMS TO REPORT IMMEDIATELY:   Following lower endoscopy (colonoscopy or flexible sigmoidoscopy):  Excessive amounts of blood in the stool  Significant tenderness or worsening of abdominal pains  Swelling of the abdomen that is new, acute  Fever of 100F or higher   Following upper endoscopy (EGD)  Vomiting of blood or coffee ground material  New chest pain or pain under the shoulder blades  Painful or persistently difficult swallowing  New shortness of breath  Fever of 100F or higher  Black, tarry-looking stools  For urgent or emergent issues, a gastroenterologist can be reached at any hour by calling [phone].   DIET: Your first meal following the procedure should be a small meal and then it is ok to progress to  your normal diet. Heavy or fried foods are harder to digest and may make you feel nauseous or bloated.  Likewise, meals heavy in dairy and vegetables can increase bloating.  Drink plenty of fluids but you should avoid alcoholic beverages for 24 hours.  ACTIVITY:  You should plan to take it easy for the rest of today and you should NOT DRIVE or use heavy machinery until tomorrow (because of the sedation medicines used during the test).    FOLLOW UP: Our staff will call the number listed on your records the next business day following your procedure to check on you and address any questions or concerns that you may have regarding the information given to you following your procedure. If we do not reach you, we will leave a message.  However, if you are feeling well and you are not experiencing any problems, there is no need to return our call.  We will assume that you have returned to your regular daily activities without incident.  If any biopsies were taken you will be contacted by phone or by letter within the next 1-3 weeks.  Please call us at [phone] if you have not heard about the biopsies in 3 weeks.    SIGNATURES/CONFIDENTIALITY: You and/or your care partner have signed paperwork which will be entered into your electronic medical record.  These signatures attest to the fact that that the information above on your After Visit Summary has been reviewed   and is understood.  Full responsibility of the confidentiality of this discharge information lies with you and/or your care-partner.  Your prescription was sent to your [facility] pharmacy  NO ASPIRIN, ASPIRIN CONTAINING PRODUCTS (BC OR GOODY'S) OR NSAIDS (IBUPROFEN, ADVIL, ALEVE, AND MOTRIN); TYLENOL IS OK TO TAKE  Please read over handouts about polyps and gastritis

## 2015-06-24 NOTE — Op Note (Signed)
[facility] Patient Name: Shirley Howard Procedure Date: 06/24/2015 2:48 PM MRN: [id number] Endoscopist: Ladene Artist , MD Age: 55 Referring MD:  Date of Birth: 12-23-1960 Gender: Female Procedure:                Upper GI endoscopy Indications:              Abdominal pain in the right upper quadrant,                            Suspected gastro-esophageal reflux disease Medicines:                Monitored Anesthesia Care Procedure:                Pre-Anesthesia Assessment:                           - Prior to the procedure, a History and Physical                            was performed, and patient medications and                            allergies were reviewed. The patient's tolerance of                            previous anesthesia was also reviewed. The risks                            and benefits of the procedure and the sedation                            options and risks were discussed with the patient.                            All questions were answered, and informed consent                            was obtained. Prior Anticoagulants: The patient has                            taken no previous anticoagulant or antiplatelet                            agents. ASA Grade Assessment: II - A patient with                            mild systemic disease. After reviewing the risks                            and benefits, the patient was deemed in                            satisfactory condition to undergo the procedure.  After obtaining informed consent, the endoscope was                            passed under direct vision. Throughout the                            procedure, the patient's blood pressure, pulse, and                            oxygen saturations were monitored continuously. The                            Model GIF-[id number]) scope was introduced                            through the mouth, and advanced to the  second part                            of duodenum. The upper GI endoscopy was                            accomplished without difficulty. The patient                            tolerated the procedure well. Scope In: Scope Out: Findings:                 Multiple localized, non-bleeding erosions were                            found in the gastric antrum. There were no stigmata                            of recent bleeding. Biopsies were taken with a cold                            forceps for histology.                           The exam of the stomach was otherwise normal.                           The cardia and gastric fundus were normal on                            retroflexion.                           The examined esophagus was normal.                           The examined duodenum was normal. Complications:            No immediate complications. Estimated Blood Loss:     Estimated blood loss was minimal. Impression:               -  Non-bleeding erosive gastropathy. Biopsied.                           - Otherwise normal EGD. Recommendation:           - Patient has a contact number available for                            emergencies. The signs and symptoms of potential                            delayed complications were discussed with the                            patient. Return to normal activities tomorrow.                            Written discharge instructions were provided to the                            patient.                           - Resume previous diet.                           - Omeprazole 40 mg po daily, 1 year of refills                           - Continue present medications.                           - Await pathology results.                           - No aspirin, ibuprofen, naproxen, or other                            non-steroidal anti-inflammatory drugs. Ladene Artist, MD 06/24/2015 3:26:23 PM This report has been signed electronically.

## 2015-06-24 NOTE — Progress Notes (Signed)
Report to PACU, RN, vss, BBS= Clear.  

## 2015-06-24 NOTE — Progress Notes (Signed)
Called to room to assist during endoscopic procedure.  Patient ID and intended procedure confirmed with present staff. Received instructions for my participation in the procedure from the performing physician.  

## 2015-06-27 ENCOUNTER — Telehealth: Payer: Self-pay

## 2015-06-27 NOTE — Telephone Encounter (Signed)
  Follow up Call-  Call back number 06/24/2015  Post procedure Call Back phone  # #[phone] cell  Permission to leave phone message Yes     Patient questions:  Do you have a fever, pain , or abdominal swelling? No. Pain Score  0 *  Have you tolerated food without any problems? Yes.    Have you been able to return to your normal activities? Yes.    Do you have any questions about your discharge instructions: Diet   No. Medications  No. Follow up visit  No.  Do you have questions or concerns about your Care? No.  Actions: * If pain score is 4 or above: No action needed, pain <4.

## 2015-06-28 ENCOUNTER — Encounter: Payer: Self-pay | Admitting: Gastroenterology

## 2015-06-30 ENCOUNTER — Encounter: Payer: Self-pay | Admitting: Nurse Practitioner

## 2015-06-30 ENCOUNTER — Ambulatory Visit (HOSPITAL_BASED_OUTPATIENT_CLINIC_OR_DEPARTMENT_OTHER): Payer: BLUE CROSS/BLUE SHIELD | Admitting: Nurse Practitioner

## 2015-06-30 VITALS — BP 113/69 | HR 74 | Temp 98.5°F | Resp 17 | Ht 62.0 in | Wt 162.8 lb

## 2015-06-30 DIAGNOSIS — C50412 Malignant neoplasm of upper-outer quadrant of left female breast: Secondary | ICD-10-CM | POA: Diagnosis not present

## 2015-06-30 NOTE — Progress Notes (Signed)
CLINIC:  Cancer Survivorship   REASON FOR VISIT:  Routine follow-up post-treatment for a recent history of breast cancer.  BRIEF ONCOLOGIC HISTORY:  Oncology History   Breast cancer of upper-outer quadrant of left female breast [facility])   Staging form: Breast, AJCC 7th Edition     Clinical stage from 11/10/2014: Stage IIA (T2, N0, M0) - Signed by Truitt Merle, MD on 11/10/2014     Pathologic stage from 11/18/2014: Stage IB (T1c, N46m, cM0) - Signed by JEnid Cutter MD on 12/01/2014       Staging comments: Staged on lumpectomy specimen by Dr. MTresa Moore         Breast cancer of upper-outer quadrant of left female breast [place]   10/28/2014 Mammogram Screening and the diagnostic mammogram shows a 2.1 x 1.3 x 2.1 cm it irregular mass in the left breast 2:00 position, 2 cm from the nipple.   11/01/2014 Initial Biopsy left breast biopsy showed invasive ductal carcinoma, grade 2, and DCIS    11/01/2014 Receptors her2 ER 100%, PR 100%, HER2/neu negative, KI67 60%    11/10/2014 Clinical Stage Stage IIA: T2 N0   11/16/2014 Surgery left breast lumpectomy with SLN biopsy.    11/16/2014 Pathology Results left breast invasive ductal carcinoma, G3, 1 out of 3 SLN had micrometastasis, (-)LVI, margins negative,  (+) high grade DCIS   11/16/2014 Pathologic Stage Stage IB: T1c N125m  11/16/2014 Oncotype testing RS 30, predicts 20% 10-y risk of distant recurrence with tamoxifen alone    12/15/2014 - 02/17/2015 Adjuvant Chemotherapy Docetaxel 75 mg/m, Cytoxan 600 mg/m, every 3 weeks,  total 4 cycles.   03/21/2015 - 05/04/2015 Radiation Therapy adjuvant breast radiation: Left breast/ 45 Gy at 1.8 Gy per fraction x 25 fractions.  Left breast boost/ 16 Gy at 2 Gy per fraction x 8 fractions   05/21/2015 -  Anti-estrogen oral therapy Letrozole 2.5 mg daily    INTERVAL HISTORY:  [facility] to review her survivorship care plan detailing her treatment course  for breast cancer, as well as monitoring long-term side effects of that treatment, education regarding health maintenance, screening, and overall wellness and health promotion.     Overall, Ms. Stoiber reports feeling quite well since completing her radiation therapy approximately two months ago.  She continues with fatigue but reports the skin changes overlying her left breast have resolved aside from a small area of hyperpigmentation at the site of the boost.  She is tolerating her letrozole with continued hot flashes (up to 10 / day) although they occur less frequently at night, which is improving her sleep. She denies any joint pain or vaginal dryness.  She has noted some increased thickening in her left breast following the radiation but denies any mass or lesion.  She denies headache, cough, shortness of breath or bone pain.  She has a good appetite and denies weight loss.    REVIEW OF SYSTEMS:  General: Fatigue and hot flashes as above. Denies fever, chills, unintentional weight loss, or night sweats. HEENT: Denies visual changes, hearing loss, mouth sores or difficulty swallowing. Cardiac: Denies palpitations, chest pain, and lower extremity edema.  Respiratory: Denies wheeze or dyspnea on exertion.  Breast: As above. GI: Denies abdominal pain, constipation, diarrhea, nausea, or vomiting.  GU: Denies dysuria, hematuria, vaginal bleeding, vaginal discharge, or vaginal dryness.  Musculoskeletal: As above. Neuro: Denies recent fall or numbness / tingling in her extremities. Skin: Denies rash, pruritis,  or open wounds.  Psych: Denies depression, anxiety, insomnia, or memory loss.   A 14-point review of systems was completed and was negative, except as noted above.   ONCOLOGY TREATMENT TEAM:  1. Surgeon:  Dr. Barry Dienes at [facility]  2. Medical Oncologist: Dr. Burr Medico 3. Radiation Oncologist: Dr. Pablo Ledger    PAST MEDICAL/SURGICAL HISTORY:  Past Medical History  Diagnosis Date    . Wrist pain   . Sore throat 10/27/2012    10/31/12 [facility] (see scanned document) - vocal cord polyp left, bilateral edema, significant postcricoid edema, c/w chronic tobacco use and chronic reflux. - Recheck 2 months - Encouraged tobacco cessation to reduce risk of laryngeal cancer     . Anxiety   . Depression   . Hot flashes   . GERD (gastroesophageal reflux disease)   . Breast cancer ([place])   . Tubular adenoma of colon 03/2010  . Arthritis   . Kidney stones   . Allergy    Past Surgical History  Procedure Laterality Date  . Cyst removal wrist Left   . Tubal ligation  1995  . Breast lumpectomy with axillary lymph node biopsy Left 11/16/2014    Procedure: LEFT BREAST LUMPECTOMY WITH AXILLARY LYMPH NODE BIOPSY;  Surgeon: Stark Klein, MD;  Location: [place];  Service: General;  Laterality: Left;  . Colonoscopy       ALLERGIES:  Allergies  Allergen Reactions  . Codeine     REACTION: nausea and vomiting.  Per pt "it makes me pass out". maw     CURRENT MEDICATIONS:  Current Outpatient Prescriptions on File Prior to Visit  Medication Sig Dispense Refill  . aspirin 81 MG tablet Take 81 mg by mouth daily.    . Biotin w/ Vitamins C & E (HAIR/SKIN/NAILS PO) Take 2 tablets by mouth daily.    . calcium carbonate (ANTACID) 500 MG chewable tablet Chew 3 tablets by mouth daily.    . calcium carbonate (OS-CAL) 600 MG TABS tablet Take 600 mg by mouth daily with breakfast.    . cholecalciferol (VITAMIN D) 1000 UNITS tablet Take 2,000 Units by mouth daily.    . diphenhydrAMINE (SOMINEX) 25 MG tablet Take 50 mg by mouth at bedtime as needed for sleep.     Marland Kitchen EVENING PRIMROSE OIL PO Take 1,300 mg by mouth daily.    . fish oil-omega-3 fatty acids 1000 MG capsule Take 2 g by mouth daily. Reported on 05/31/2015    . fluticasone (FLONASE) 50 MCG/ACT nasal spray Place 2 sprays into both nostrils daily. 16 g 0  . Inulin (PHILLIPS FIBER GOOD PO) Take by mouth.    . letrozole  (FEMARA) 2.5 MG tablet Take 1 tablet (2.5 mg total) by mouth daily. 30 tablet 3  . Multiple Vitamin (MULTIVITAMIN) tablet Take 1 tablet by mouth daily.    Marland Kitchen omeprazole (PRILOSEC) 40 MG capsule Take one tablet daily- 30 minutes before breakfast 30 capsule 11   No current facility-administered medications on file prior to visit.     ONCOLOGIC FAMILY HISTORY:  Family History  Problem Relation Age of Onset  . Supraventricular tachycardia Mother   . Thyroid disease Mother 73    sluggish thyroid  . Throat cancer Father      smoker  . Stroke Father   . Alzheimer's disease Paternal Grandmother   . Ulcers Maternal Grandmother   . Colon cancer Neg Hx   . Esophageal cancer Neg Hx   . Pancreatic cancer Neg Hx   .  Rectal cancer Neg Hx   . Stomach cancer Neg Hx      GENETIC COUNSELING/TESTING: No    SOCIAL HISTORY:  Mairely Foxworth is married and lives with her spouse in [place], [place].  She has 2 children. 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  She is a former smoker and denies any current or history of illicit drug use.  She uses alcohol very rarely.   PHYSICAL EXAMINATION:  Vital Signs: Filed Vitals:   06/30/15 1333  BP: 113/69  Pulse: 74  Temp: 98.5 F (36.9 C)  Resp: 17   ECOG Performance Status: 0  General: Well-nourished, well-appearing female in no acute distress.  She is unaccompanied in clinic today.   HEENT: Head is atraumatic and normocephalic.  Pupils equal and reactive to light and accomodation. Conjunctivae clear without exudate.  Sclerae anicteric. Oral mucosa is pink, moist, and intact without lesions.  Oropharynx is pink without lesions or erythema.  Lymph: No cervical, supraclavicular, infraclavicular, or axillary lymphadenopathy noted on palpation.  Cardiovascular: Regular rate and rhythm without murmurs, rubs, or gallops. Respiratory: Clear to auscultation bilaterally. Chest expansion symmetric without accessory muscle use on  inspiration or expiration.  GI: Abdomen soft and round. No tenderness to palpation. Bowel sounds normoactive in 4 quadrants. GU: Deferred.  Neuro: No focal deficits. Steady gait.  Psych: Mood and affect normal and appropriate for situation.  Extremities: No edema, cyanosis, or clubbing.  Skin: Warm and dry. No open lesions noted.   LABORATORY DATA:  None for this visit.  DIAGNOSTIC IMAGING:  None for this visit.     ASSESSMENT AND PLAN:   1. Breast cancer: Stage IB invasive ductal carcinoma of the left breast (10/2014), grade 3, ER positive, PR positive, HER2/neu negative, S/P lumpectomy/SLNB (11/2014), oncotype RS 30, S/P adjuvant chemotherapy with docetaxel and cyclophosphamide (completed 02/2015) followed by adjuvant radiation therapy (completed 04/2015) with adjuvant endocrine therapy with letrozole begun 05/2015.  Ms. Mcglory is doing well without clinical symptoms worrisome for disease recurrence and is tolerating her letrozole reasonably well. She will follow-up with her medical oncologist,  Dr. Burr Medico, in August 2017 with history and physical examination per surveillance protocol and will continue ongoing follow up with Dr. Barry Dienes every six months.  She will continue her anti-estrogen therapy with letrozole at this time and will report any new or worsening side effects. A comprehensive survivorship care plan and treatment summary was reviewed with the patient today detailing her breast cancer diagnosis, treatment course, potential late/long-term effects of treatment, appropriate follow-up care with recommendations for the future, and patient education resources.  A copy of this summary, along with a letter will be sent to the patient's primary care provider via in basket message after today's visit.  Ms. Caudle is welcome to return to the Survivorship Clinic in the future, as needed; no follow-up will be scheduled at this time.    2. Bone health:  Given Ms. Meroney's age/history of breast cancer  and her current treatment regimen including endocrine therapy with letrozole, she is at risk for bone demineralization. She has not yet had her baseline bone density testing and will discuss this with Dr. Burr Medico at her next follow up in August 2017.  We will continue to monitor this closely while she is on endocrine therapy.  In the meantime, she was encouraged to increase her consumption of foods rich in calcium and vitamin D as well as to increase her weight-bearing activities.  She was given education on specific  activities to promote bone health.  3. Cancer screening:  Due to Ms. Sabourin's history and her age, she should receive screening for skin cancers, colon cancer, and gynecologic cancers.  She just completed colonoscopy where one non-cancerous polyp was found with repeat study recommended in 5 years. The information and recommendations are listed on the patient's comprehensive care plan/treatment summary and were reviewed in detail with the patient.    4. Health maintenance and wellness promotion: Ms. Fiorello was encouraged to consume 5-7 servings of fruits and vegetables per day. We reviewed the "Nutrition Rainbow" handout, as well as discussed recommendations to maximize nutrition and minimize recurrence, such as increased intake of fruits, vegetables, lean proteins, and minimizing the intake of red meats and processed foods.  She was also encouraged to engage in moderate to vigorous exercise for 30 minutes per day most days of the week. We discussed the [employer] fitness program, which is designed for cancer survivors to help them become more physically fit after cancer treatments.  She was instructed to limit her alcohol consumption and continue to abstain from tobacco use.  A copy of the "Take Control of Your Health" brochure was given to her reinforcing these recommendations.   5. Support services/counseling: It is not uncommon for this period of the patient's cancer care trajectory to be one  of many emotions and stressors. We discussed an opportunity for her to participate in the next session of [facility] ("Finding Your New Normal") support group series designed for patients after they have completed treatment.  Ms. Makris was encouraged to take advantage of our many other support services programs, support groups, and/or counseling in coping with her new life as a cancer survivor after completing anti-cancer treatment. She was offered support today through active listening and expressive supportive counseling. She was given information regarding our available services and encouraged to contact me with any questions or for help enrolling in any of our support group/programs.    A total of 60 minutes of face-to-face time was spent with this patient with greater than 50% of that time in counseling and care-coordination.   Sylvan Cheese, NP  Survivorship Program [facility] [phone]   Note: PRIMARY CARE PROVIDER Lupita Dawn, [place] [phone]

## 2015-07-07 ENCOUNTER — Encounter: Payer: Self-pay | Admitting: Hematology

## 2015-07-11 ENCOUNTER — Encounter: Payer: Self-pay | Admitting: Hematology

## 2015-08-12 ENCOUNTER — Encounter: Payer: Self-pay | Admitting: Hematology

## 2015-09-07 ENCOUNTER — Other Ambulatory Visit: Payer: Self-pay | Admitting: Hematology

## 2015-09-07 DIAGNOSIS — C50912 Malignant neoplasm of unspecified site of left female breast: Secondary | ICD-10-CM

## 2015-09-11 NOTE — Progress Notes (Signed)
[place]  [phone] [phone]  Clinic follow up Note   Patient Care Team: Lupita Dawn, MD as PCP - General (Family Medicine) Stark Klein, MD as Consulting Physician (General Surgery) Truitt Merle, MD as Consulting Physician (Hematology) Arloa Koh, MD as Consulting Physician (Radiation Oncology) Rockwell Germany, RN as Registered Nurse Mauro Kaufmann, RN as Registered Nurse Sylvan Cheese, NP as Nurse Practitioner (Nurse Practitioner) 09/12/2015  CHIEF COMPLAINTS:  Follow up left breast cancer  Oncology History   Breast cancer of upper-outer quadrant of left female breast [facility])   Staging form: Breast, AJCC 7th Edition     Clinical stage from 11/10/2014: Stage IIA (T2, N0, M0) - Signed by Truitt Merle, MD on 11/10/2014     Pathologic stage from 11/18/2014: Stage IB (T1c, N51m, cM0) - Signed by JEnid Cutter MD on 12/01/2014       Staging comments: Staged on lumpectomy specimen by Dr. MTresa Moore         Breast cancer of upper-outer quadrant of left female breast [place]   10/28/2014 Mammogram    Screening and the diagnostic mammogram shows a 2.1 x 1.3 x 2.1 cm it irregular mass in the left breast 2:00 position, 2 cm from the nipple.     11/01/2014 Initial Biopsy    left breast biopsy showed invasive ductal carcinoma, grade 2, and DCIS      11/01/2014 Receptors her2    ER 100%, PR 100%, HER2/neu negative, KI67 60%      11/10/2014 Clinical Stage    Stage IIA: T2 N0     11/16/2014 Surgery    left breast lumpectomy with SLN biopsy.      11/16/2014 Pathology Results    left breast invasive ductal carcinoma, G3, 1 out of 3 SLN had micrometastasis, (-)LVI, margins negative,  (+) high grade DCIS     11/16/2014 Pathologic Stage    Stage IB: T1c N166m    11/16/2014 Oncotype testing    RS 30, predicts 20% 10-y risk of distant recurrence with tamoxifen alone      12/15/2014 - 02/17/2015 Adjuvant Chemotherapy    Docetaxel 75 mg/m, Cytoxan  600 mg/m, every 3 weeks,  total 4 cycles.     03/21/2015 - 05/04/2015 Radiation Therapy    adjuvant breast radiation: Left breast/ 45 Gy at 1.8 Gy per fraction x 25 fractions.  Left breast boost/ 16 Gy at 2 Gy per fraction x 8 fractions     05/21/2015 -  Anti-estrogen oral therapy    Letrozole 2.5 mg daily     06/30/2015 Survivorship    Survivorship visit completed      HISTORY OF PRESENTING ILLNESS:  DeJanaaiffey [age].o. female is here because of her recently diagnosed left breast cancer. She is accompanied by her husband, daughter, mother to our multidisciplinary breast clinic today.  She felt a lump in her left breast one  Month ago, mild tender, she otherwise feels well no other new symptoms. This was developed by her primary care physician and screening mammogram was obtained. Mammogram reviewed a 2.1 cm mass in the left breast 2:00 position, core needle biopsy showed invasive ductal carcinoma, and DCIS.  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 She feels well overall. She does have mild low back pain, does not take any education. She is physically active at home, but does not exercise regularly. No family history of breast cancer. She was on hormone replacement for  the past 8 years, and just stopped progesterone a few weeks ago.  CURRENT THERAPY: Letrozole 2.62m daily started in 05/2015   INTERIM HISTORY  Mrs RWooleyreturns for follow up. She has been on letrozole for over 3 months now, she is tolerating moderate well. She has moderate hot flash, during the day and night. She is able to sleep well at night overall. The hot flashes are bothersome, but overall manageable. She also reports left hip pain in the past to 3 weeks. It's intermittent, happens most when she gets up from chair, and improves when she walks around. She occasionally takes Tylenol. No significant other joint discomfort or stiffness. She otherwise is doing well, denies any other new symptoms. Her  weight is stable.  MEDICAL HISTORY:  Past Medical History:  Diagnosis Date  . Allergy   . Anxiety   . Arthritis   . [place]   . Depression   . GERD (gastroesophageal reflux disease)   . Hot flashes   . Kidney stones   . [facility]ore throat 10/27/2012   [facility] vocal cord polyp left, bilateral edema, significant postcricoid edema, c/w chronic tobacco use and chronic reflux. - Recheck 2 months - Encouraged tobacco cessation to reduce risk of laryngeal cancer     . Tubular adenoma of colon 03/2010  . Wrist pain     SURGICAL HISTORY: Past Surgical History:  Procedure Laterality Date  . BREAST LUMPECTOMY WITH AXILLARY LYMPH NODE BIOPSY Left 11/16/2014   Procedure: LEFT BREAST LUMPECTOMY WITH AXILLARY LYMPH NODE BIOPSY;  Surgeon: FStark Klein MD;  [facility]  Service: General;  Laterality: Left;  . COLONOSCOPY    . CYST REMOVAL WRIST Left   . tubal ligation  1995   GYN HISTORY  Menarchal: 10 LMP: 48 Contraceptive: 16 HRT: 8 years, stopped in 10/2014  G2P2:    SOCIAL HISTORY: Social History   Social History  . Marital status: Married    Spouse name: N/A  . Number of children: 2  . Years of education: N/A   Occupational History  . truck driver    Social History Main Topics  . Smoking status: Former Smoker    Packs/day: 1.50    Years: 40.00    Types: Cigarettes    Quit date: 11/04/2012  . Smokeless tobacco: Never Used  . Alcohol use Yes     Comment: social users   . Drug use: No  . Sexual activity: Yes    Partners: Male     Comment: Husband   Other Topics Concern  . 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 Supraventricular tachycardia  Mother   . Thyroid disease Mother 746   sluggish thyroid  . Throat cancer Father      smoker  . Stroke Father   . Alzheimer's disease Paternal Grandmother   . Ulcers Maternal Grandmother   . Colon cancer Neg Hx   . Esophageal cancer Neg Hx   . Pancreatic cancer Neg Hx   . Rectal cancer Neg Hx   . Stomach cancer Neg Hx     ALLERGIES:  is allergic to  codeine.  MEDICATIONS:  Current Outpatient Prescriptions  Medication Sig Dispense Refill  . aspirin 81 MG tablet Take 81 mg by mouth daily.    . Biotin w/ Vitamins C & E (HAIR/SKIN/NAILS PO) Take 2 tablets by mouth daily.    . calcium carbonate (ANTACID) 500 MG chewable tablet Chew 3 tablets by mouth daily.    . calcium carbonate (OS-CAL) 600 MG TABS tablet Take 600 mg by mouth daily with breakfast.    . cholecalciferol (VITAMIN D) 1000 UNITS tablet Take 2,000 Units by mouth daily.    . diphenhydrAMINE (SOMINEX) 25 MG tablet Take 50 mg by mouth at bedtime as needed for sleep.     Marland Kitchen EVENING PRIMROSE OIL PO Take 1,300 mg by mouth daily.    . fish oil-omega-3 fatty acids 1000 MG capsule Take 2 g by mouth daily. Reported on 05/31/2015    . fluticasone (FLONASE) 50 MCG/ACT nasal spray Place 2 sprays into both nostrils daily. 16 g 0  . Inulin (PHILLIPS FIBER GOOD PO) Take by mouth.    . letrozole (FEMARA) 2.5 MG tablet Take 1 tablet (2.5 mg total) by mouth daily. 90 tablet 2  . Multiple Vitamin (MULTIVITAMIN) tablet Take 1 tablet by mouth daily.    Marland Kitchen omeprazole (PRILOSEC) 40 MG capsule Take one tablet daily- 30 minutes before breakfast 30 capsule 11   No current facility-administered medications for this visit.     REVIEW OF SYSTEMS:   Constitutional: Denies fevers, chills or abnormal night sweats Eyes: Denies blurriness of vision, double vision or watery eyes Ears, nose, mouth, throat, and face: Denies mucositis or sore throat Respiratory: Denies cough, dyspnea or wheezes Cardiovascular: Denies palpitation, chest discomfort or lower  extremity swelling Gastrointestinal:  Denies nausea, heartburn or change in bowel habits Skin: Denies abnormal skin rashes Lymphatics: Denies new lymphadenopathy or easy bruising Neurological:Denies numbness, tingling or new weaknesses Behavioral/Psych: Mood is stable, no new changes  All other systems were reviewed with the patient and are negative.  PHYSICAL EXAMINATION: ECOG PERFORMANCE STATUS: 1  Vitals:   09/12/15 0856  BP: 122/80  Pulse: 65  Resp: 18  Temp: 98.3 F (36.8 C)   Filed Weights   09/12/15 0856  Weight: 165 lb 1.6 oz (74.9 kg)    GENERAL:alert, no distress and comfortable SKIN: skin color, texture, turgor are normal, there are a few scattered small skin rash on her scalp EYES: normal, conjunctiva are pink and non-injected, sclera clear OROPHARYNX:no exudate, no erythema and lips, buccal mucosa, and tongue normal  NECK: supple, thyroid normal size, non-tender, without nodularity LYMPH:  no palpable lymphadenopathy in the cervical, axillary or inguinal LUNGS: clear to auscultation and percussion with normal breathing effort HEART: regular rate & rhythm and no murmurs and no lower extremity edema ABDOMEN:abdomen soft, non-tender and normal bowel sounds Musculoskeletal:no cyanosis of digits and no clubbing  PSYCH: alert & oriented x 3 with fluent speech NEURO: no focal motor/sensory deficits Breasts: Breast inspection showed them to be symmetrical with no nipple discharge. The left breast incision has healed well There is a 2.5X2.5cm mass underneath the surgical incision, likely surgical seroma. Palpation of the right breast and bilateral axilla with showed no palpable mass or adenopathy.  LABORATORY DATA:  I have reviewed the data as listed CBC Latest Ref Rng & Units 09/12/2015 05/17/2015 02/17/2015  WBC 3.9 - 10.3 10e3/uL 5.7 5.7 26.0(H)  Hemoglobin 11.6 - 15.9 g/dL 13.3 12.7 11.0(L)  Hematocrit 34.8 - 46.6 % 39.0 38.3 32.7(L)  Platelets 145 -  400 10e3/uL 230  233 257     Recent Labs  02/17/15 0857 05/17/15 1215 09/12/15 0845  NA 137 140 140  K 4.1 4.1 4.4  CO2 _0 GLUCOSE 125 90 101  BUN 19.5 15.3 16.2  CREATININE 0.7 0.8 0.8  CALCIUM 9.6 9.7 10.3  PROT 7.0 7.2 7.4  ALBUMIN 3.7 4.1 4.0  AST _1 ALT _2 ALKPHOS 73 76 94  BILITOT 0.31 0.37 0.51    Pathology report Diagnosis 1. Breast, lumpectomy, Left - INVASIVE DUCTAL CARCINOMA, GRADE 3, SPANNING 2 CM. - DUCTAL CARCINOMA IN SITU, HIGH GRADE. - RESECTION MARGINS ARE NEGATIVE FOR INVASIVE AND IN SITU CARCINOMA. - LOBULAR NEOPLASIA (ATYPICAL LOBULAR HYPERPLASIA). - SEE ONCOLOGY TABLE. 2. Lymph node, sentinel, biopsy, Left axillary #1 - ONE OF ONE LYMPH NODES NEGATIVE FOR CARCINOMA (0/1). 3. Lymph node, sentinel, biopsy, Left axillary #2 - MICROMETASTASIS IN ONE OF ONE LYMPH NODE (1/1). 4. Lymph node, sentinel, biopsy, Left axillary #3 - ONE OF ONE LYMPH NODES NEGATIVE FOR CARCINOMA (0/1).  Microscopic Comment 1. BREAST, INVASIVE TUMOR, WITH LYMPH NODES PRESENT Specimen, including laterality and lymph node sampling (sentinel, non-sentinel): Left breast lump and left axillary sentinel lymph nodes. Procedure: Left breast lumpectomy and left axillary sentinel lymph node biopsies. Histologic type: Invasive ductal carcinoma. Grade: 3 Tubule formation: 3 Nuclear pleomorphism: 3 Mitotic: 2 Tumor size (gross measurement): 2 cm Margins: Invasive, distance to closest margin: 0.2 cm (inferior). In-situ, distance to closest margin: 0.4 cm (superior). If margin positive, focally or broadly: N/A. Lymphovascular invasion: Definitive invasion not identified. Ductal carcinoma in situ: Present. Grade: III Extensive intraductal component: No. Lobular neoplasia: Present (atypical lobular hyperplasia). Tumor focality: Unifocal. Treatment effect: N/A. Extent of tumor: Confined to breast parenchyma. Lymph nodes: Examined: 3 Sentinel 0 Non-sentinel 3 Total Lymph  nodes with metastasis: 1 Isolated tumor cells (< 0.2 mm): 0 Micrometastasis: (> 0.2 mm and < 2.0 mm): 1 Macrometastasis: (> 2.0 mm): 0 Extracapsular extension: No. Breast prognostic profile: Performed on biopsy ([id number]). Her2 will be repeated on current specimen. Estrogen receptor: Positive, strong staining intensity. Progesterone receptor: Positive, strong staining intensity. Her 2 neu: Negative. Ki-67: 60%. Non-neoplastic breast: Fibrocystic change. TNM: pT1c, pN57m  Results: HER2 - NEGATIVE RATIO OF HER2/CEP17 SIGNALS 1.15 AVERAGE HER2 COPY NUMBER PER CELL 3.15  Oncotype Dx RS: 30, predicts 20% 10-y risk of distant recurrence with tamoxifen alone   RADIOGRAPHIC STUDIES: I have personally reviewed the radiological images as listed and agreed with the findings in the report.  No new scans    ASSESSMENT & PLAN:  55year old Caucasian female, postmenopausal, presented with palpable left breast mass  1. Left breast invasive ductal carcinoma, [id number], stage IB, grade 3, ER positive, PR positive, HER-2 negative, (+) DCIS -I previously reviewed her surgical pathology results in great details with patient and her husband. -She is now status post lumpectomy and sentinel lymph node biopsy. Surgical margins were negative. She has early-stage breast cancer, likely cured by complete surgical resection -I discussed the risk of cancer recurrence after her surgery. The Oncotype DX test results were explained to her in details. Her recurrence score is 30, which predicts 20% 10 year risk of distant recurrence with tamoxifen alone. She has received adjuvant chemotherapy with docetaxel and Cytoxan. -She is on adjuvant letrozole, tolerating MuValere Drossell, has mild-to-moderate hot flash, her left hip pain is probably related to purchase also. Overall side effects are manageable so far. She'll continue letrozole, plan for a  total 5-10 years. -She will continue breast cancer surveillance. She is due  for mammogram next month, I'll schedule for her. She has not had bone density scan, I will get a baseline scan next month. -Lab results reviewed with her, her CBC and CMP are within normal limits.  2. Left hip pain -Likely secondary to letrozole. -I encouraged her to be more physically active, and exercise regularly, such as hiking, biking or swimming. She agrees to try. -She will use Tylenol as needed.  3. Anxiety and depression -stable. She takes xanax as needed.   4. GERD -She is on omeprazole, and takes Tums sometime    5. Hot flush -Slightly worse after she started letrozole. So far manageable. -We discussed medical management of hot flash if it to get worse, such as SSRI or Neurontin.   Plan -Continue letrozole 2.5 mg once daily, I refilled for her today  -Diagnostic bilateral mammogram and bone density scan next month -I'll see her back in 4 months with lab   All questions were answered. The patient knows to call the clinic with any problems, questions or concerns. I spent 20 minutes counseling the patient face to face. The total time spent in the appointment was 25 minutes and more than 50% was on counseling.     Truitt Merle, MD 09/12/2015

## 2015-09-12 ENCOUNTER — Telehealth: Payer: Self-pay | Admitting: Hematology

## 2015-09-12 ENCOUNTER — Ambulatory Visit (HOSPITAL_BASED_OUTPATIENT_CLINIC_OR_DEPARTMENT_OTHER): Payer: Managed Care, Other (non HMO) | Admitting: Hematology

## 2015-09-12 ENCOUNTER — Encounter: Payer: Self-pay | Admitting: Hematology

## 2015-09-12 ENCOUNTER — Other Ambulatory Visit (HOSPITAL_BASED_OUTPATIENT_CLINIC_OR_DEPARTMENT_OTHER): Payer: Managed Care, Other (non HMO)

## 2015-09-12 VITALS — BP 122/80 | HR 65 | Temp 98.3°F | Resp 18 | Ht 62.0 in | Wt 165.1 lb

## 2015-09-12 DIAGNOSIS — K219 Gastro-esophageal reflux disease without esophagitis: Secondary | ICD-10-CM

## 2015-09-12 DIAGNOSIS — C50412 Malignant neoplasm of upper-outer quadrant of left female breast: Secondary | ICD-10-CM

## 2015-09-12 DIAGNOSIS — N951 Menopausal and female climacteric states: Secondary | ICD-10-CM | POA: Diagnosis not present

## 2015-09-12 DIAGNOSIS — F418 Other specified anxiety disorders: Secondary | ICD-10-CM

## 2015-09-12 DIAGNOSIS — C50912 Malignant neoplasm of unspecified site of left female breast: Secondary | ICD-10-CM

## 2015-09-12 DIAGNOSIS — M25552 Pain in left hip: Secondary | ICD-10-CM | POA: Diagnosis not present

## 2015-09-12 DIAGNOSIS — Z780 Asymptomatic menopausal state: Secondary | ICD-10-CM

## 2015-09-12 LAB — CBC WITH DIFFERENTIAL/PLATELET
BASO%: 0.3 % (ref 0.0–2.0)
Basophils Absolute: 0 10*3/uL (ref 0.0–0.1)
EOS ABS: 0.2 10*3/uL (ref 0.0–0.5)
EOS%: 3.7 % (ref 0.0–7.0)
HCT: 39 % (ref 34.8–46.6)
HGB: 13.3 g/dL (ref 11.6–15.9)
LYMPH%: 23.9 % (ref 14.0–49.7)
MCH: 30.3 pg (ref 25.1–34.0)
MCHC: 34.1 g/dL (ref 31.5–36.0)
MCV: 88.8 fL (ref 79.5–101.0)
MONO#: 0.4 10*3/uL (ref 0.1–0.9)
MONO%: 7.2 % (ref 0.0–14.0)
NEUT%: 64.9 % (ref 38.4–76.8)
NEUTROS ABS: 3.7 10*3/uL (ref 1.5–6.5)
Platelets: 230 10*3/uL (ref 145–400)
RBC: 4.39 10*6/uL (ref 3.70–5.45)
RDW: 13.8 % (ref 11.2–14.5)
WBC: 5.7 10*3/uL (ref 3.9–10.3)
lymph#: 1.4 10*3/uL (ref 0.9–3.3)

## 2015-09-12 LAB — COMPREHENSIVE METABOLIC PANEL
ALT: 27 U/L (ref 0–55)
AST: 31 U/L (ref 5–34)
Albumin: 4 g/dL (ref 3.5–5.0)
Alkaline Phosphatase: 94 U/L (ref 40–150)
Anion Gap: 11 mEq/L (ref 3–11)
BUN: 16.2 mg/dL (ref 7.0–26.0)
CHLORIDE: 102 meq/L (ref 98–109)
CO2: 27 meq/L (ref 22–29)
Calcium: 10.3 mg/dL (ref 8.4–10.4)
Creatinine: 0.8 mg/dL (ref 0.6–1.1)
EGFR: 82 mL/min/{1.73_m2} — AB (ref >90)
GLUCOSE: 101 mg/dL (ref 70–140)
POTASSIUM: 4.4 meq/L (ref 3.5–5.1)
SODIUM: 140 meq/L (ref 136–145)
TOTAL PROTEIN: 7.4 g/dL (ref 6.4–8.3)
Total Bilirubin: 0.51 mg/dL (ref 0.20–1.20)

## 2015-09-12 MED ORDER — LETROZOLE 2.5 MG PO TABS: 2.5000 mg | ORAL_TABLET | Freq: Every day | ORAL | 2 refills | Status: DC

## 2015-09-12 NOTE — Telephone Encounter (Signed)
Gave pt cal & avs °

## 2015-11-07 ENCOUNTER — Ambulatory Visit
Admission: RE | Admit: 2015-11-07 | Discharge: 2015-11-07 | Disposition: A | Discharge status: Home / Self Care | Payer: Managed Care, Other (non HMO) | Source: Ambulatory Visit | Attending: Hematology | Admitting: Hematology

## 2015-11-07 DIAGNOSIS — C50412 Malignant neoplasm of upper-outer quadrant of left female breast: Secondary | ICD-10-CM

## 2015-11-07 DIAGNOSIS — Z780 Asymptomatic menopausal state: Secondary | ICD-10-CM

## 2015-12-09 ENCOUNTER — Ambulatory Visit: Payer: Managed Care, Other (non HMO) | Admitting: Family Medicine

## 2015-12-16 ENCOUNTER — Ambulatory Visit: Payer: Managed Care, Other (non HMO) | Admitting: Family Medicine

## 2016-01-16 ENCOUNTER — Other Ambulatory Visit (HOSPITAL_BASED_OUTPATIENT_CLINIC_OR_DEPARTMENT_OTHER): Payer: Managed Care, Other (non HMO)

## 2016-01-16 ENCOUNTER — Telehealth: Payer: Self-pay | Admitting: Hematology

## 2016-01-16 ENCOUNTER — Encounter: Payer: Self-pay | Admitting: Hematology

## 2016-01-16 ENCOUNTER — Ambulatory Visit (HOSPITAL_BASED_OUTPATIENT_CLINIC_OR_DEPARTMENT_OTHER): Payer: Managed Care, Other (non HMO) | Admitting: Hematology

## 2016-01-16 VITALS — BP 135/63 | HR 57 | Temp 98.9°F | Resp 18 | Ht 62.0 in | Wt 170.0 lb

## 2016-01-16 DIAGNOSIS — M8580 Other specified disorders of bone density and structure, unspecified site: Secondary | ICD-10-CM

## 2016-01-16 DIAGNOSIS — Z170 Estrogen receptor positive status [ER+]: Secondary | ICD-10-CM

## 2016-01-16 DIAGNOSIS — C50412 Malignant neoplasm of upper-outer quadrant of left female breast: Secondary | ICD-10-CM | POA: Diagnosis not present

## 2016-01-16 DIAGNOSIS — K219 Gastro-esophageal reflux disease without esophagitis: Secondary | ICD-10-CM

## 2016-01-16 DIAGNOSIS — C50912 Malignant neoplasm of unspecified site of left female breast: Secondary | ICD-10-CM | POA: Diagnosis not present

## 2016-01-16 DIAGNOSIS — M2550 Pain in unspecified joint: Secondary | ICD-10-CM

## 2016-01-16 DIAGNOSIS — N951 Menopausal and female climacteric states: Secondary | ICD-10-CM

## 2016-01-16 LAB — COMPREHENSIVE METABOLIC PANEL
ALBUMIN: 3.7 g/dL (ref 3.5–5.0)
ALT: 21 U/L (ref 0–55)
AST: 21 U/L (ref 5–34)
Alkaline Phosphatase: 104 U/L (ref 40–150)
Anion Gap: 11 mEq/L (ref 3–11)
BILIRUBIN TOTAL: 0.34 mg/dL (ref 0.20–1.20)
BUN: 14.7 mg/dL (ref 7.0–26.0)
CO2: 26 meq/L (ref 22–29)
Calcium: 9.7 mg/dL (ref 8.4–10.4)
Chloride: 103 mEq/L (ref 98–109)
Creatinine: 0.8 mg/dL (ref 0.6–1.1)
EGFR: 86 mL/min/{1.73_m2} — AB (ref >90)
Glucose: 97 mg/dl (ref 70–140)
Potassium: 4.3 mEq/L (ref 3.5–5.1)
SODIUM: 140 meq/L (ref 136–145)
TOTAL PROTEIN: 6.9 g/dL (ref 6.4–8.3)

## 2016-01-16 LAB — CBC WITH DIFFERENTIAL/PLATELET
BASO%: 0.7 % (ref 0.0–2.0)
BASOS ABS: 0 10*3/uL (ref 0.0–0.1)
EOS%: 3.5 % (ref 0.0–7.0)
Eosinophils Absolute: 0.2 10*3/uL (ref 0.0–0.5)
HEMATOCRIT: 38.8 % (ref 34.8–46.6)
HEMOGLOBIN: 12.8 g/dL (ref 11.6–15.9)
LYMPH#: 1.3 10*3/uL (ref 0.9–3.3)
LYMPH%: 22.6 % (ref 14.0–49.7)
MCH: 29.2 pg (ref 25.1–34.0)
MCHC: 33 g/dL (ref 31.5–36.0)
MCV: 88.5 fL (ref 79.5–101.0)
MONO#: 0.4 10*3/uL (ref 0.1–0.9)
MONO%: 6.4 % (ref 0.0–14.0)
NEUT%: 66.8 % (ref 38.4–76.8)
NEUTROS ABS: 3.8 10*3/uL (ref 1.5–6.5)
Platelets: 272 10*3/uL (ref 145–400)
RBC: 4.39 10*6/uL (ref 3.70–5.45)
RDW: 13.2 % (ref 11.2–14.5)
WBC: 5.7 10*3/uL (ref 3.9–10.3)

## 2016-01-16 NOTE — Progress Notes (Addendum)
Shirley Howard  [phone]) [phone] [phone]  Clinic follow up Note   Patient Care Team: Lupita Dawn, MD as PCP - General (Family Medicine) Stark Klein, MD as Consulting Physician (General Surgery) Truitt Merle, MD as Consulting Physician (Hematology) Arloa Koh, MD as Consulting Physician (Radiation Oncology) Rockwell Germany, RN as Registered Nurse Mauro Kaufmann, RN as Registered Nurse Sylvan Cheese, NP as Nurse Practitioner (Nurse Practitioner) 01/16/2016  CHIEF COMPLAINTS:  Follow up left breast cancer  Oncology History   Breast cancer of upper-outer quadrant of left female breast [facility])   Staging form: Breast, AJCC 7th Edition     Clinical stage from 11/10/2014: Stage IIA (T2, N0, M0) - Signed by Truitt Merle, MD on 11/10/2014     Pathologic stage from 11/18/2014: Stage IB (T1c, N27m, cM0) - Signed by JEnid Cutter MD on 12/01/2014       Staging comments: Staged on lumpectomy specimen by Dr. MTresa Moore         [place] of upper[place]outer quadrant [place]   10/28/2014 Mammogram    Screening and the diagnostic mammogram shows a 2.1 x 1.3 x 2.1 cm it irregular mass in the left breast 2:00 position, 2 cm from the nipple.      11/01/2014 Initial Biopsy    left breast biopsy showed invasive ductal carcinoma, grade 2, and DCIS       11/01/2014 Receptors her2    ER 100%, PR 100%, HER2/neu negative, KI67 60%       11/10/2014 Clinical Stage    Stage IIA: T2 N0      11/16/2014 Surgery    left breast lumpectomy with SLN biopsy.       11/16/2014 Pathology Results    left breast invasive ductal carcinoma, G3, 1 out of 3 SLN had micrometastasis, (-)LVI, margins negative,  (+) high grade DCIS      11/16/2014 Pathologic Stage    Stage IB: T1c N121m     11/16/2014 Oncotype testing    RS 30, predicts 20% 10-y risk of distant recurrence with tamoxifen alone       12/15/2014 - 02/17/2015 Adjuvant Chemotherapy    Docetaxel 75  mg/m, Cytoxan 600 mg/m, every 3 weeks,  total 4 cycles.      03/21/2015 - 05/04/2015 Radiation Therapy    adjuvant breast radiation: Left breast/ 45 Gy at 1.8 Gy per fraction x 25 fractions.  Left breast boost/ 16 Gy at 2 Gy per fraction x 8 fractions      05/21/2015 -  Anti-estrogen oral therapy    Letrozole 2.5 mg daily      06/30/2015 Survivorship    Survivorship visit completed       HISTORY OF PRESENTING ILLNESS:  DeDenaiiffey Howard.o. female is here because of her recently diagnosed left breast cancer. She is accompanied by her husband, daughter, mother to our multidisciplinary breast clinic today.  She felt a lump in her left breast one  Month ago, mild tender, she otherwise feels well no other new symptoms. This was developed by her primary care physician and screening mammogram was obtained. Mammogram reviewed a 2.1 cm mass in the left breast 2:00 position, core needle biopsy showed invasive ductal carcinoma, and DCIS.  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 She feels well overall. She does have mild low back pain, does not take any education. She is physically active at home, but does not exercise regularly.  No family history of breast cancer. She was on hormone replacement for the past 8 years, and just stopped progesterone a few weeks ago.  CURRENT THERAPY: Letrozole 2.'5mg'$  daily started in 05/2015   INTERIM HISTORY  Shirley Howard returns for follow up. She is doing well overall. She tolerating letrozole well. She reports bilateral finger joint stiffness and achiness in the past few months, especially in the morning, her symptoms does improve through the day. She is not taking any medication. She does have occasional right shoulder discomfort. Her previous left hip pain has resolved. No other new joint discomfort. She has good appetite and energy level, remains to be physically active.  MEDICAL HISTORY:  Past Medical History:  Diagnosis Date  . Allergy    . Anxiety   . Arthritis   . Breast cancer ([place])   . Depression   . GERD (gastroesophageal reflux disease)   . Hot flashes   . Kidney stones   . Sore throat 10/27/2012   10/31/12 [facility] ENT (see scanned document) - vocal cord polyp left, bilateral edema, significant postcricoid edema, c/w chronic tobacco use and chronic reflux. - Recheck 2 months - Encouraged tobacco cessation to reduce risk of laryngeal cancer     . Tubular adenoma of colon 03/2010  . Wrist pain     SURGICAL HISTORY: Past Surgical History:  Procedure Laterality Date  . BREAST LUMPECTOMY WITH AXILLARY LYMPH NODE BIOPSY Left 11/16/2014   Procedure: LEFT BREAST LUMPECTOMY WITH AXILLARY LYMPH NODE BIOPSY;  Surgeon: Stark Klein, MD;  Location: [place];  Service: General;  Laterality: Left;  . COLONOSCOPY    . CYST REMOVAL WRIST Left   . tubal ligation  1995   GYN HISTORY  Menarchal: 10 LMP: 48 Contraceptive: 16 HRT: 8 years, stopped in 10/2014  G2P2:    SOCIAL HISTORY: Social History   Social History  . Marital status: Married    Spouse name: N/A  . Number of children: 2  . Years of education: N/A   Occupational History  . truck driver    Social History Main Topics  . Smoking status: Former Smoker    Packs/day: 1.50    Years: 40.00    Types: Cigarettes    Quit date: 11/04/2012  . Smokeless tobacco: Never Used  . Alcohol use Yes     Comment: social users   . Drug use: No  . Sexual activity: Yes    Partners: Male     Comment: Husband   Other Topics Concern  . Not on file   Social History Narrative   Lives with husband in [place].   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 Supraventricular tachycardia Mother   . Thyroid disease Mother 27    sluggish thyroid  . Throat cancer Father      smoker   . Stroke Father   . Alzheimer's disease Paternal Grandmother   . Ulcers Maternal Grandmother   . Colon cancer Neg Hx   . Esophageal cancer Neg Hx   . Pancreatic cancer Neg Hx   . Rectal cancer Neg Hx   . Stomach cancer Neg Hx     ALLERGIES:  is allergic to codeine.  MEDICATIONS:  Current Outpatient Prescriptions  Medication Sig Dispense Refill  .  aspirin 81 MG tablet Take 81 mg by mouth daily.    . Biotin w/ Vitamins C & E (HAIR/SKIN/NAILS PO) Take 2 tablets by mouth daily.    . calcium carbonate (ANTACID) 500 MG chewable tablet Chew 3 tablets by mouth daily.    . calcium carbonate (OS-CAL) 600 MG TABS tablet Take 600 mg by mouth daily with breakfast.    . cholecalciferol (VITAMIN D) 1000 UNITS tablet Take 2,000 Units by mouth daily.    . diphenhydrAMINE (SOMINEX) 25 MG tablet Take 50 mg by mouth at bedtime as needed for sleep.     Marland Kitchen EVENING PRIMROSE OIL PO Take 1,300 mg by mouth daily.    . fish oil-omega-3 fatty acids 1000 MG capsule Take 2 g by mouth daily. Reported on 05/31/2015    . fluticasone (FLONASE) 50 MCG/ACT nasal spray Place 2 sprays into both nostrils daily. 16 g 0  . Inulin (PHILLIPS FIBER GOOD PO) Take by mouth.    . letrozole (FEMARA) 2.5 MG tablet Take 1 tablet (2.5 mg total) by mouth daily. 90 tablet 2  . Multiple Vitamin (MULTIVITAMIN) tablet Take 1 tablet by mouth daily.    Marland Kitchen omeprazole (PRILOSEC) 40 MG capsule Take one tablet daily- 30 minutes before breakfast 30 capsule 11   No current facility-administered medications for this visit.     REVIEW OF SYSTEMS:   Constitutional: Denies fevers, chills or abnormal night sweats Eyes: Denies blurriness of vision, double vision or watery eyes Ears, nose, mouth, throat, and face: Denies mucositis or sore throat Respiratory: Denies cough, dyspnea or wheezes Cardiovascular: Denies palpitation, chest discomfort or lower extremity swelling Gastrointestinal:  Denies nausea, heartburn or change in bowel habits Skin:  Denies abnormal skin rashes Lymphatics: Denies new lymphadenopathy or easy bruising Neurological:Denies numbness, tingling or new weaknesses Behavioral/Psych: Mood is stable, no new changes  All other systems were reviewed with the patient and are negative.  PHYSICAL EXAMINATION: ECOG PERFORMANCE STATUS: 1  Vitals:   01/16/16 0821  BP: 135/63  Pulse: (!) 57  Resp: 18  Temp: 98.9 F (37.2 C)   Filed Weights   01/16/16 0821  Weight: 170 lb (77.1 kg)    GENERAL:alert, no distress and comfortable SKIN: skin color, texture, turgor are normal, there are a few scattered small skin rash on her scalp EYES: normal, conjunctiva are pink and non-injected, sclera clear OROPHARYNX:no exudate, no erythema and lips, buccal mucosa, and tongue normal  NECK: supple, thyroid normal size, non-tender, without nodularity LYMPH:  no palpable lymphadenopathy in the cervical, axillary or inguinal LUNGS: clear to auscultation and percussion with normal breathing effort HEART: regular rate & rhythm and no murmurs and no lower extremity edema ABDOMEN:abdomen soft, non-tender and normal bowel sounds Musculoskeletal:no cyanosis of digits and no clubbing  PSYCH: alert & oriented x 3 with fluent speech NEURO: no focal motor/sensory deficits Breasts: Breast inspection showed them to be symmetrical with no nipple discharge. The left breast incision has healed well There is a 2.5X2.5cm mass underneath the surgical incision, likely surgical seroma. Palpation of the right breast and bilateral axilla with showed no palpable mass or adenopathy.  LABORATORY DATA:  I have reviewed the data as listed CBC Latest Ref Rng & Units 01/16/2016 09/12/2015 05/17/2015  WBC 3.9 - 10.3 10e3/uL 5.7 5.7 5.7  Hemoglobin 11.6 - 15.9 g/dL 12.8 13.3 12.7  Hematocrit 34.8 - 46.6 % 38.8 39.0 38.3  Platelets 145 - 400 10e3/uL 272 230 233   CMP Latest Ref Rng & Units 01/16/2016  09/12/2015 05/17/2015  Glucose 70 - 140 mg/dl 97 101 90  BUN  7.0 - 26.0 mg/dL 14.7 16.2 15.3  Creatinine 0.6 - 1.1 mg/dL 0.8 0.8 0.8  Sodium 136 - 145 mEq/L 140 140 140  Potassium 3.5 - 5.1 mEq/L 4.3 4.4 4.1  Chloride 96 - 112 mEq/L - - -  CO2 22 - 29 mEq/L '26 27 29  '$ Calcium 8.4 - 10.4 mg/dL 9.7 10.3 9.7  Total Protein 6.4 - 8.3 g/dL 6.9 7.4 7.2  Total Bilirubin 0.20 - 1.20 mg/dL 0.34 0.51 0.37  Alkaline Phos 40 - 150 U/L 104 94 76  AST 5 - 34 U/L '21 31 21  '$ ALT 0 - 55 U/L '21 27 23    '$ Pathology report Diagnosis 1. Breast, lumpectomy, Left - INVASIVE DUCTAL CARCINOMA, GRADE 3, SPANNING 2 CM. - DUCTAL CARCINOMA IN SITU, HIGH GRADE. - RESECTION MARGINS ARE NEGATIVE FOR INVASIVE AND IN SITU CARCINOMA. - LOBULAR NEOPLASIA (ATYPICAL LOBULAR HYPERPLASIA). - SEE ONCOLOGY TABLE. 2. Lymph node, sentinel, biopsy, Left axillary #1 - ONE OF ONE LYMPH NODES NEGATIVE FOR CARCINOMA (0/1). 3. Lymph node, sentinel, biopsy, Left axillary #2 - MICROMETASTASIS IN ONE OF ONE LYMPH NODE (1/1). 4. Lymph node, sentinel, biopsy, Left axillary #3 - ONE OF ONE LYMPH NODES NEGATIVE FOR CARCINOMA (0/1).  Microscopic Comment 1. BREAST, INVASIVE TUMOR, WITH LYMPH NODES PRESENT Specimen, including laterality and lymph node sampling (sentinel, non-sentinel): Left breast lump and left axillary sentinel lymph nodes. Procedure: Left breast lumpectomy and left axillary sentinel lymph node biopsies. Histologic type: Invasive ductal carcinoma. Grade: 3 Tubule formation: 3 Nuclear pleomorphism: 3 Mitotic: 2 Tumor size (gross measurement): 2 cm Margins: Invasive, distance to closest margin: 0.2 cm (inferior). In-situ, distance to closest margin: 0.4 cm (superior). If margin positive, focally or broadly: N/A. Lymphovascular invasion: Definitive invasion not identified. Ductal carcinoma in situ: Present. Grade: III Extensive intraductal component: No. Lobular neoplasia: Present (atypical lobular hyperplasia). Tumor focality: Unifocal. Treatment effect: N/A. Extent of  tumor: Confined to breast parenchyma. Lymph nodes: Examined: 3 Sentinel 0 Non-sentinel 3 Total Lymph nodes with metastasis: 1 Isolated tumor cells (< 0.2 mm): 0 Micrometastasis: (> 0.2 mm and < 2.0 mm): 1 Macrometastasis: (> 2.0 mm): 0 Extracapsular extension: No. Breast prognostic profile: Performed on biopsy ([id number]). Her2 will be repeated on current specimen. Estrogen receptor: Positive, strong staining intensity. Progesterone receptor: Positive, strong staining intensity. Her 2 neu: Negative. Ki-67: 60%. Non-neoplastic breast: Fibrocystic change. TNM: pT1c, pN37m  Results: HER2 - NEGATIVE RATIO OF HER2/CEP17 SIGNALS 1.15 AVERAGE HER2 COPY NUMBER PER CELL 3.15  Oncotype Dx RS: 30, predicts 20% 10-y risk of distant recurrence with tamoxifen alone   RADIOGRAPHIC STUDIES: I have personally reviewed the radiological images as listed and agreed with the findings in the report.  Mammogram 11/07/2015 IMPRESSION: No evidence of malignancy in either breast.  RECOMMENDATION: Bilateral diagnostic mammogram in 1 year is recommended.  DEXA 11/07/2015 ASSESSMENT: The BMD measured at Femur Neck Right is 0.868 g/cm2 with a T-score of -1.2. This patient is considered osteopenic according to [place] criteria Major Osteoporotic Fracture: 6.4% Hip Fracture:                0.4% Population:                  [place] Risk Factors:                Secondary Osteoporosis   ASSESSMENT & PLAN:  55year old Caucasian female, postmenopausal, presented with  palpable left breast mass  1. Left breast invasive ductal carcinoma, pT1cN53mM0, stage IB, grade 3, ER positive, PR positive, HER-2 negative, (+) DCIS -I previously reviewed her surgical pathology results in great details with patient and her husband. -She is now status post lumpectomy and sentinel lymph node biopsy. Surgical margins were negative. She has early-stage breast cancer, likely cured by  complete surgical resection -I discussed the risk of cancer recurrence after her surgery. The Oncotype DX test results were explained to her in details. Her recurrence score is 30, which predicts 20% 10 year risk of distant recurrence with tamoxifen alone. She has received adjuvant chemotherapy with docetaxel and Cytoxan. -She is on adjuvant letrozole, tolerating MValere [place], hot flash has improved, she now has bilateral finger joint stiffness and achiness, I encouraged her to exercise , and take ibuprofen as needed. She is willing to continue letrozole for now. Overall side effects are manageable so far. She'll continue letrozole, plan for a total 5-10 years. -We discussed the alternative therapy, such as exemestane or tamoxifen. She can switch if she has worsening arthralgias. -She is clinically doing well. Lab results reviewed with her, her CBC and CMP are within normal limits. Her exam was unremarkable except the seroma in the left breast. No clinical concern for recurrence. -She will continue breast cancer surveillance.   2. Finger joint arthralgia -Likely secondary to letrozole. -I encouraged her to be more physically active, and exercise regularly, especially her hands.  She agrees to try. -She will use Tylenol or ibuprofen as needed.  3. Osteopenia -I discussed her recent bone density scan result from 11/07/2015, which showed osteopenia in her right femur neck. -She does not have high-risk for fracture, I encouraged her take calcium and vitamin D, she does not need by phosphonate for now. -We discussed that aromatase inhibitor may wake her bone, and or monitor her bone density scan every 2 years.   4. Anxiety and depression -stable. She takes xanax as needed.   5. GERD -She is on omeprazole, and takes Tums sometime   6. Hot flush -Slightly worse after she started letrozole. So far manageable. -We discussed medical management of hot flash if it to get worse, such as SSRI or Neurontin.    Plan -Continue letrozole 2.5 mg once daily. -I'll see her back in 4 months with lab  -I encouraged her to exercise, and take ibuprofen as needed for arthralgia.  All questions were answered. The patient knows to call the clinic with any problems, questions or concerns. I spent 20 minutes counseling the patient face to face. The total time spent in the appointment was 25 minutes and more than 50% was on counseling.     FTruitt Merle MD 01/16/2016

## 2016-01-16 NOTE — Telephone Encounter (Signed)
Gave patient avs report and appointments for April  °

## 2016-01-17 LAB — VITAMIN D 25 HYDROXY (VIT D DEFICIENCY, FRACTURES): Vitamin D, 25-Hydroxy: 50.3 ng/mL (ref 30.0–100.0)

## 2016-01-20 ENCOUNTER — Ambulatory Visit: Payer: Managed Care, Other (non HMO) | Admitting: Family Medicine

## 2016-01-23 ENCOUNTER — Encounter: Payer: Self-pay | Admitting: Family Medicine

## 2016-01-23 ENCOUNTER — Encounter: Payer: Managed Care, Other (non HMO) | Admitting: Family Medicine

## 2016-01-23 ENCOUNTER — Ambulatory Visit (INDEPENDENT_AMBULATORY_CARE_PROVIDER_SITE_OTHER): Payer: Managed Care, Other (non HMO) | Admitting: Family Medicine

## 2016-01-23 VITALS — BP 112/70 | HR 82 | Temp 98.1°F | Ht 62.0 in | Wt 172.0 lb

## 2016-01-23 DIAGNOSIS — Z0000 Encounter for general adult medical examination without abnormal findings: Secondary | ICD-10-CM | POA: Diagnosis not present

## 2016-01-23 DIAGNOSIS — R87610 Atypical squamous cells of undetermined significance on cytologic smear of cervix (ASC-US): Secondary | ICD-10-CM | POA: Diagnosis not present

## 2016-01-23 DIAGNOSIS — Z1159 Encounter for screening for other viral diseases: Secondary | ICD-10-CM | POA: Diagnosis not present

## 2016-01-23 LAB — HEPATITIS C ANTIBODY: HCV Ab: NEGATIVE

## 2016-01-23 LAB — LIPID PANEL
Cholesterol: 220 mg/dL — ABNORMAL HIGH (ref <200)
HDL: 45 mg/dL — AB (ref >50)
LDL Cholesterol: 100 mg/dL — ABNORMAL HIGH (ref <100)
TRIGLYCERIDES: 374 mg/dL — AB (ref <150)
Total CHOL/HDL Ratio: 4.9 Ratio (ref <5.0)
VLDL: 75 mg/dL — ABNORMAL HIGH (ref <30)

## 2016-01-23 NOTE — Patient Instructions (Addendum)
It was nice to meet you. I am checking your cholesterol today in addition to screening you for hepatitis C and HIV. Try to walk or get some form of aerobic exercise 30 minutes 3-4 days per week. For your hand pain/stiffness, try taking ibuprofen or Tylenol before bedtime to see if this helps.

## 2016-01-23 NOTE — Progress Notes (Signed)
55 y.o. year old female presents for well woman/preventative visit   Acute Concerns: None, would just like cholesterol checked and hepatitis screening.   Of note, the patient was diagnosed with breast cancer in 2016. She has been through lumpectomy and radiation. She is currently on letrozole daily. She notes continued finger stiffness associated with her medication.  She has not tried ibuprofen or Tylenol for the stiffness. She notes she is woken up in the middle the night due to her pain, moving her hands makes her symptoms better. No swelling in the joints.   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 The try to avoid eating out.   Exercise: none regularly  12 point ROS negative besides that noted in HPI and: intermittent vaginal dryness improved with KY jelly and intermittent hot flashes (not too bothersome).  Social:  Social History   Social History  . Marital status: Married    Spouse name: N/A  . Number of children: 2  . Years of education: N/A   Occupational History  . truck driver    Social History Main Topics  . Smoking status: Former Smoker    Packs/day: 1.50    Years: 40.00    Types: Cigarettes    Quit date: 11/04/2012  . Smokeless tobacco: Never Used  . Alcohol use Yes     Comment: social users   . Drug use: No  . Sexual activity: Yes    Partners: Male     Comment: Husband   Other Topics Concern  . None   Social History Narrative   Lives with husband in [place].   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 Sporadic, occasional alcohol use  Immunization: Immunization History  Administered Date(s) Administered  . Influenza Split 11/26/2011  . Influenza,inj,Quad PF,36+ Mos 11/24/2012  . Influenza-Unspecified 11/20/2015  .  Pneumococcal Conjugate-13 03/08/2014  . Td 09/27/2008  . Zoster 11/20/2015    Cancer Screening:  Pap Smear: 03/2014 ASCUS with negative HPV  Mammogram: h/o breast cancer diagnosed with lumpectomy and radiation in 2016; 11/07/15- no evidence of malignancy, repeat in 1 year  Colonoscopy: 06/24/15  Dexa: osteopenia 11/07/15    Physical Exam: VITALS: Reviewed Blood pressure 112/70, pulse 82, temperature 98.1 F (36.7 C), temperature source Oral, height 5\' 2"  (1.575 m), weight 172 lb (78 kg).  GEN: Pleasant female, NAD HEENT: Normocephalic, PERRL, EOMI, no scleral icterus, bilateral TM pearly grey, nasal septum midline, MMM, uvula midline, no anterior or posterior lymphadenopathy, no thyromegaly CARDIAC:RRR, S1 and S2 present, no murmur, no heaves/thrills RESP: CTAB, normal effort ABD: soft, no tenderness, normal bowel sounds EXT: No edema, 2+ radial and DP pulses SKIN: no rash  ASSESSMENT & PLAN: 55 y.o. female presents for annual well woman/preventative exam.  Please see problem specific assessment and plan.   Pap smear abnormality of cervix with ASCUS favoring benign Pap smear 03/2014 with ASCUS and negative high risk HPV. Will need repeat pap with co-testing 03/2017.  HYPERLIPIDEMIA Will get lipid panel today  Preventative health care Up to date on vaccines. Lipid panel obtained. Screening HIV and hepatitis C obtained. Pap smear due 03/2017 Mammogram performed recently, following with oncology for h/o breast cancer Discussed importance of well balanced diet and  regular exercise.  Patient with mild menopausal symptoms, no need for intervention at this time (continue [place] jelly for vaginal dryness).

## 2016-01-24 LAB — HIV ANTIBODY (ROUTINE TESTING W REFLEX): HIV 1&2 Ab, 4th Generation: NONREACTIVE

## 2016-01-24 NOTE — Assessment & Plan Note (Signed)
Will get lipid panel today

## 2016-01-24 NOTE — Assessment & Plan Note (Addendum)
Up to date on vaccines. Lipid panel obtained. Screening HIV and hepatitis C obtained. Pap smear due 03/2017 Mammogram performed recently, following with oncology for h/o breast cancer Discussed importance of well balanced diet and regular exercise.  Patient with mild menopausal symptoms, no need for intervention at this time (continue [place] jelly for vaginal dryness).

## 2016-01-24 NOTE — Assessment & Plan Note (Signed)
Pap smear 03/2014 with ASCUS and negative high risk HPV. Will need repeat pap with co-testing 03/2017.

## 2016-01-25 ENCOUNTER — Encounter: Payer: Self-pay | Admitting: Family Medicine

## 2016-01-25 ENCOUNTER — Encounter (HOSPITAL_COMMUNITY): Payer: Self-pay | Admitting: Family Medicine

## 2016-02-17 ENCOUNTER — Other Ambulatory Visit: Payer: Self-pay | Admitting: Nurse Practitioner

## 2016-04-30 NOTE — Progress Notes (Signed)
[place]  [phone] [phone]  Clinic follow up Note   Patient Care Team: Lupita Dawn, MD as PCP - General (Family Medicine) Stark Klein, MD as Consulting Physician (General Surgery) Truitt Merle, MD as Consulting Physician (Hematology) Arloa Koh, MD as Consulting Physician (Radiation Oncology) Rockwell Germany, RN as Registered Nurse Mauro Kaufmann, RN as Registered Nurse Sylvan Cheese, NP as Nurse Practitioner (Nurse Practitioner) 05/14/2016  CHIEF COMPLAINTS:  Follow up left breast cancer  Oncology History   Breast cancer of upper-outer quadrant of left female breast [facility])   Staging form: Breast, AJCC 7th Edition     Clinical stage from 11/10/2014: Stage IIA (T2, N0, M0) - Signed by Truitt Merle, MD on 11/10/2014     Pathologic stage from 11/18/2014: Stage IB (T1c, N62m, cM0) - Signed by JEnid Cutter MD on 12/01/2014       Staging comments: Staged on lumpectomy specimen by Dr. MTresa Moore         [place] of [place] quadrant of [place]   10/28/2014 Mammogram    Screening and the diagnostic mammogram shows a 2.1 x 1.3 x 2.1 cm it irregular mass in the left breast 2:00 position, 2 cm from the nipple.      11/01/2014 Initial Biopsy    left breast biopsy showed invasive ductal carcinoma, grade 2, and DCIS       11/01/2014 Receptors her2    ER 100%, PR 100%, HER2/neu negative, KI67 60%       11/10/2014 Clinical Stage    Stage IIA: T2 N0      11/16/2014 Surgery    left breast lumpectomy with SLN biopsy.       11/16/2014 Pathology Results    left breast invasive ductal carcinoma, G3, 1 out of 3 SLN had micrometastasis, (-)LVI, margins negative,  (+) high grade DCIS      11/16/2014 Pathologic Stage    Stage IB: T1c N158m     11/16/2014 Oncotype testing    RS 30, predicts 20% 10-y risk of distant recurrence with tamoxifen alone       12/15/2014 - 02/17/2015 Adjuvant Chemotherapy    Docetaxel 75  mg/m, Cytoxan 600 mg/m, every 3 weeks,  total 4 cycles.      03/21/2015 - 05/04/2015 Radiation Therapy    adjuvant breast radiation: Left breast/ 45 Gy at 1.8 Gy per fraction x 25 fractions.  Left breast boost/ 16 Gy at 2 Gy per fraction x 8 fractions      05/21/2015 -  Anti-estrogen oral therapy    Letrozole 2.5 mg daily      06/30/2015 Survivorship    Survivorship visit completed      HISTORY OF PRESENTING ILLNESS:  Shirley Howard [age].o. female is here because of her recently diagnosed left breast cancer. She is accompanied by her husband, daughter, mother to our multidisciplinary breast clinic today.  She felt a lump in her left breast one  Month ago, mild tender, she otherwise feels well no other new symptoms. This was developed by her primary care physician and screening mammogram was obtained. Mammogram reviewed a 2.1 cm mass in the left breast 2:00 position, core needle biopsy showed invasive ductal carcinoma, and DCIS.  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 She feels well overall. She does have mild low back pain, does not take any education. She is physically active at home, but does not exercise regularly. No  family history of breast cancer. She was on hormone replacement for the past 8 years, and just stopped progesterone a few weeks ago.  CURRENT THERAPY: Letrozole 2.37m daily started in 05/2015   INTERIM HISTORY  Mrs RStampreturns for follow up. She is doing well today. Tolerating Letrozole well other than some hot flashes and joint pain. The joint pain is only in her hands. This is tolerable. She had some pain in her one finger before starting letrozole. She has been on a diet and has lost 10 lbs. Pt has intermittent bilateral pain in her lower ribs. It is tender when palpated. She wears a bra with an under wire. The pain is every day, but is not always on both sides. No difference in pain after specific movements. Denies back pain, knee pain, or any  other concerns.   MEDICAL HISTORY:  Past Medical History:  Diagnosis Date  . Allergy   . Anxiety   . Arthritis   . Breast cancer ([place]   . Depression   . GERD (gastroesophageal reflux disease)   . Hot flashes   . Kidney stones   . Sore throat 10/27/2012   [facility] - vocal cord polyp left, bilateral edema, significant postcricoid edema, c/w chronic tobacco use and chronic reflux. - Recheck 2 months - Encouraged tobacco cessation to reduce risk of laryngeal cancer     . Tubular adenoma of colon 03/2010  . Wrist pain     SURGICAL HISTORY: Past Surgical History:  Procedure Laterality Date  . BREAST LUMPECTOMY WITH AXILLARY LYMPH NODE BIOPSY Left 11/16/2014   Procedure: LEFT BREAST LUMPECTOMY WITH AXILLARY LYMPH NODE BIOPSY;  Surgeon: FStark Klein MD;  [facility]  Service: General;  Laterality: Left;  . COLONOSCOPY    . CYST REMOVAL WRIST Left   . tubal ligation  1995   GYN HISTORY  Menarchal: 10 LMP: 48 Contraceptive: 16 HRT: 8 years, stopped in 10/2014  G2P2:  SOCIAL HISTORY: Social History   Social History  . Marital status: Married    Spouse name: N/A  . Number of children: 2  . Years of education: N/A   Occupational History  . truck driver    Social History Main Topics  . Smoking status: Former Smoker    Packs/day: 1.50    Years: 40.00    Types: Cigarettes    Quit date: 11/04/2012  . Smokeless tobacco: Never Used  . Alcohol use Yes     Comment: social users   . Drug use: No  . Sexual activity: Yes    Partners: Male     Comment: Husband   Other Topics Concern  . 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 Supraventricular tachycardia Mother     . Thyroid disease Mother 775   sluggish thyroid  . Throat cancer Father      smoker  . Stroke Father   . Alzheimer's disease Paternal Grandmother   . Ulcers Maternal Grandmother   . Colon cancer Neg Hx   . Esophageal cancer Neg Hx   . Pancreatic cancer Neg Hx   . Rectal cancer Neg Hx   .  Stomach cancer Neg Hx     ALLERGIES:  is allergic to codeine.  MEDICATIONS:  Current Outpatient Prescriptions  Medication Sig Dispense Refill  . aspirin 81 MG tablet Take 81 mg by mouth daily.    . Biotin w/ Vitamins C & E (HAIR/SKIN/NAILS PO) Take 2 tablets by mouth daily.    . calcium carbonate (ANTACID) 500 MG chewable tablet Chew 3 tablets by mouth daily.    . calcium carbonate (OS-CAL) 600 MG TABS tablet Take 600 mg by mouth daily with breakfast.    . diphenhydrAMINE (SOMINEX) 25 MG tablet Take 50 mg by mouth at bedtime as needed for sleep.     Marland Kitchen EVENING PRIMROSE OIL PO Take 1,300 mg by mouth daily.    . fish oil-omega-3 fatty acids 1000 MG capsule Take 2 g by mouth daily. Reported on 05/31/2015    . fluticasone (FLONASE) 50 MCG/ACT nasal spray Place 2 sprays into both nostrils daily. 16 g 0  . Inulin (PHILLIPS FIBER GOOD PO) Take by mouth.    . letrozole (FEMARA) 2.5 MG tablet Take 1 tablet (2.5 mg total) by mouth daily. 90 tablet 2  . Multiple Vitamin (MULTIVITAMIN) tablet Take 1 tablet by mouth daily.    Marland Kitchen omeprazole (PRILOSEC) 40 MG capsule Take one tablet daily- 30 minutes before breakfast 30 capsule 11   No current facility-administered medications for this visit.     REVIEW OF SYSTEMS:   Constitutional: Denies fevers, chills or abnormal night sweats (+) hot flashes Eyes: Denies blurriness of vision, double vision or watery eyes Ears, nose, mouth, throat, and face: Denies mucositis or sore throat Respiratory: Denies cough, dyspnea or wheezes Cardiovascular: Denies palpitation, chest discomfort or lower extremity swelling Gastrointestinal:  Denies nausea, heartburn or change in  bowel habits Skin: Denies abnormal skin rashes Lymphatics: Denies new lymphadenopathy or easy bruising Neurological:Denies numbness, tingling or new weaknesses Behavioral/Psych: Mood is stable, no new changes  Musc: (+) joint pain (hands) (+) lower rib pain All other systems were reviewed with the patient and are negative.  PHYSICAL EXAMINATION: ECOG PERFORMANCE STATUS: 1  Vitals:   05/14/16 0849  BP: 132/79  Pulse: (!) 50  Resp: 18  Temp: 98 F (36.7 C)   Filed Weights   05/14/16 0849  Weight: 161 lb 9.6 oz (73.3 kg)   GENERAL:alert, no distress and comfortable SKIN: skin color, texture, turgor are normal, there are a few scattered small skin rash on her scalp EYES: normal, conjunctiva are pink and non-injected, sclera clear OROPHARYNX:no exudate, no erythema and lips, buccal mucosa, and tongue normal  NECK: supple, thyroid normal size, non-tender, without nodularity LYMPH:  no palpable lymphadenopathy in the cervical, axillary or inguinal LUNGS: clear to auscultation and percussion with normal breathing effort HEART: regular rate & rhythm and no murmurs and no lower extremity edema ABDOMEN:abdomen soft, non-tender and normal bowel sounds Musculoskeletal: no cyanosis of digits and no clubbing  PSYCH: alert & oriented x 3 with fluent speech NEURO: no focal motor/sensory deficits Breasts: Breast inspection showed them to be symmetrical with no nipple discharge. The left breast incision has healed well There is a 2 X 2.5cm mass underneath the surgical incision on left lateral breast, likely surgical seroma. Palpation of the right breast and bilateral axilla showed no palpable mass or adenopathy.  LABORATORY DATA:  I have reviewed the data as listed CBC Latest Ref Rng & Units 05/14/2016 01/16/2016 09/12/2015  WBC 3.9 - 10.3 10e3/uL 5.8 5.7 5.7  Hemoglobin 11.6 - 15.9 g/dL  13.7 12.8 13.3  Hematocrit 34.8 - 46.6 % 39.8 38.8 39.0  Platelets 145 - 400 10e3/uL 277 272 230    Recent  Labs  09/12/15 0845 01/16/16 0757 05/14/16 0822  NA 140 140 140  K 4.4 4.3 4.2  CO2 _0 GLUCOSE 101 97 97  BUN 16.2 14.7 16.3  CREATININE 0.8 0.8 0.8  CALCIUM 10.3 9.7 10.2  PROT 7.4 6.9 7.2  ALBUMIN 4.0 3.7 4.3  AST _1 ALT _2 ALKPHOS 94 104 115  BILITOT 0.51 0.34 0.47    Pathology report Diagnosis 1. Breast, lumpectomy, Left - INVASIVE DUCTAL CARCINOMA, GRADE 3, SPANNING 2 CM. - DUCTAL CARCINOMA IN SITU, HIGH GRADE. - RESECTION MARGINS ARE NEGATIVE FOR INVASIVE AND IN SITU CARCINOMA. - LOBULAR NEOPLASIA (ATYPICAL LOBULAR HYPERPLASIA). - SEE ONCOLOGY TABLE. 2. Lymph node, sentinel, biopsy, Left axillary #1 - ONE OF ONE LYMPH NODES NEGATIVE FOR CARCINOMA (0/1). 3. Lymph node, sentinel, biopsy, Left axillary #2 - MICROMETASTASIS IN ONE OF ONE LYMPH NODE (1/1). 4. Lymph node, sentinel, biopsy, Left axillary #3 - ONE OF ONE LYMPH NODES NEGATIVE FOR CARCINOMA (0/1).  Microscopic Comment 1. BREAST, INVASIVE TUMOR, WITH LYMPH NODES PRESENT Specimen, including laterality and lymph node sampling (sentinel, non-sentinel): Left breast lump and left axillary sentinel lymph nodes. Procedure: Left breast lumpectomy and left axillary sentinel lymph node biopsies. Histologic type: Invasive ductal carcinoma. Grade: 3 Tubule formation: 3 Nuclear pleomorphism: 3 Mitotic: 2 Tumor size (gross measurement): 2 cm Margins: Invasive, distance to closest margin: 0.2 cm (inferior). In-situ, distance to closest margin: 0.4 cm (superior). If margin positive, focally or broadly: N/A. Lymphovascular invasion: Definitive invasion not identified. Ductal carcinoma in situ: Present. Grade: III Extensive intraductal component: No. Lobular neoplasia: Present (atypical lobular hyperplasia). Tumor focality: Unifocal. Treatment effect: N/A. Extent of tumor: Confined to breast parenchyma. Lymph nodes: Examined: 3 Sentinel 0 Non-sentinel 3 Total Lymph nodes with metastasis:  1 Isolated tumor cells (< 0.2 mm): 0 Micrometastasis: (> 0.2 mm and < 2.0 mm): 1 Macrometastasis: (> 2.0 mm): 0 Extracapsular extension: No. Breast prognostic profile: Performed on biopsy ([id number]). Her2 will be repeated on current specimen. Estrogen receptor: Positive, strong staining intensity. Progesterone receptor: Positive, strong staining intensity. Her 2 neu: Negative. Ki-67: 60%. Non-neoplastic breast: Fibrocystic change. TNM: pT1c, pN48m  Results: HER2 - NEGATIVE RATIO OF HER2/CEP17 SIGNALS 1.15 AVERAGE HER2 COPY NUMBER PER CELL 3.15  Oncotype Dx RS: 30, predicts 20% 10-y risk of distant recurrence with tamoxifen alone   RADIOGRAPHIC STUDIES: I have personally reviewed the radiological images as listed and agreed with the findings in the report.  Diagnostic Mammogram 11/07/2015 IMPRESSION: No evidence of malignancy in either breast.  DEXA Scan 11/07/2015 The BMD measured at Femur Neck Right is 0.868 g/cm2 with a T-score of -1.2. This patient is considered osteopenic according to [facility] criteria.  Site Region Measured Date Measured Age YA BMD Significant CHANGE T-score DualFemur Neck Right 11/07/2015    55.0         -1.2    0.868 g/cm2  AP Spine  L1-L4      11/07/2015    [age]         0.0     1.196 g/cm2  ASSESSMENT & PLAN:  56y.o. Caucasian female, postmenopausal, presented with palpable left breast mass  1. Left breast invasive ductal carcinoma, [id number], stage IB, grade 3, ER positive, PR positive, HER-2 negative, (+) DCIS -I previously reviewed her surgical pathology  results in great details with patient and her husband. -She is now status post lumpectomy and sentinel lymph node biopsy. Surgical margins were negative. She has early-stage breast cancer, likely cured by complete surgical resection -I previously discussed the risk of cancer recurrence after her surgery. The Oncotype DX test results were explained to her in details.  Her recurrence score is 30, which predicts 20% 10 year risk of distant recurrence with tamoxifen alone. She has received adjuvant chemotherapy with docetaxel and Cytoxan. -She is on adjuvant letrozole, tolerating well, has mild-to-moderate hot flash, her left hip pain is probably related to purchase also. Overall side effects are manageable so far. She'll continue letrozole, plan for a total 5-7 years. -She will continue breast cancer surveillance. She is due for mammogram in October -Lab results reviewed with her, her CBC and CMP are within normal limits. Her physical exam was unremarkable except the surgical scar in the left breast. She is clinically doing well, so for recurrence.  2. Anxiety and depression -stable. She takes xanax as needed.   3. GERD -She is on omeprazole, and takes Tums sometime   4. Hot flash -Slightly worse after she started letrozole. So far manageable.  5. Arthralgia and bilateral rib cage pain -In her hands, tolerable -Lower b/l ribs. I have encouraged her to wear bras without an under wire, like sports bras. I also recommended a heating pad and tylenol or ibuprofen.  -if her rib pain is getting worse, I will consider a bone scan to rule out bone mets. She knows to call me.   6. Osteopenia -Her bone density scan from October 2017 showed osteopenia, no high risk for fracture. -She will continue calcium and vitamin D  Plan -Continue letrozole 2.5 mg once daily -I'll see her back in 4 months with lab    All questions were answered. The patient knows to call the clinic with any problems, questions or concerns.  I spent 20 minutes counseling the patient face to face. The total time spent in the appointment was 25 minutes and more than 50% was on counseling.  This document serves as a record of services personally performed by Truitt Merle, MD. It was created on her behalf by Martinique Casey, a trained medical scribe. The creation of this record is based on the scribe's  personal observations and the provider's statements to them. This document has been checked and approved by the attending provider.  I have reviewed the above documentation for accuracy and completeness and I agree with the above.   Truitt Merle, MD 05/14/2016

## 2016-05-14 ENCOUNTER — Other Ambulatory Visit (HOSPITAL_BASED_OUTPATIENT_CLINIC_OR_DEPARTMENT_OTHER): Payer: Managed Care, Other (non HMO)

## 2016-05-14 ENCOUNTER — Ambulatory Visit (HOSPITAL_BASED_OUTPATIENT_CLINIC_OR_DEPARTMENT_OTHER): Payer: Managed Care, Other (non HMO) | Admitting: Hematology

## 2016-05-14 ENCOUNTER — Encounter: Payer: Self-pay | Admitting: Hematology

## 2016-05-14 ENCOUNTER — Telehealth: Payer: Self-pay | Admitting: Hematology

## 2016-05-14 VITALS — BP 132/79 | HR 50 | Temp 98.0°F | Resp 18 | Ht 62.0 in | Wt 161.6 lb

## 2016-05-14 DIAGNOSIS — R0781 Pleurodynia: Secondary | ICD-10-CM

## 2016-05-14 DIAGNOSIS — M8580 Other specified disorders of bone density and structure, unspecified site: Secondary | ICD-10-CM

## 2016-05-14 DIAGNOSIS — C50412 Malignant neoplasm of upper-outer quadrant of left female breast: Secondary | ICD-10-CM

## 2016-05-14 DIAGNOSIS — K219 Gastro-esophageal reflux disease without esophagitis: Secondary | ICD-10-CM

## 2016-05-14 DIAGNOSIS — Z170 Estrogen receptor positive status [ER+]: Secondary | ICD-10-CM

## 2016-05-14 DIAGNOSIS — M25541 Pain in joints of right hand: Secondary | ICD-10-CM | POA: Diagnosis not present

## 2016-05-14 DIAGNOSIS — M25542 Pain in joints of left hand: Secondary | ICD-10-CM

## 2016-05-14 DIAGNOSIS — N951 Menopausal and female climacteric states: Secondary | ICD-10-CM

## 2016-05-14 DIAGNOSIS — F418 Other specified anxiety disorders: Secondary | ICD-10-CM

## 2016-05-14 LAB — CBC WITH DIFFERENTIAL/PLATELET
BASO%: 0.7 % (ref 0.0–2.0)
BASOS ABS: 0 10*3/uL (ref 0.0–0.1)
EOS%: 3.4 % (ref 0.0–7.0)
Eosinophils Absolute: 0.2 10*3/uL (ref 0.0–0.5)
HCT: 39.8 % (ref 34.8–46.6)
HGB: 13.7 g/dL (ref 11.6–15.9)
LYMPH%: 23.2 % (ref 14.0–49.7)
MCH: 30.4 pg (ref 25.1–34.0)
MCHC: 34.5 g/dL (ref 31.5–36.0)
MCV: 88.1 fL (ref 79.5–101.0)
MONO#: 0.4 10*3/uL (ref 0.1–0.9)
MONO%: 6.8 % (ref 0.0–14.0)
NEUT#: 3.8 10*3/uL (ref 1.5–6.5)
NEUT%: 65.9 % (ref 38.4–76.8)
PLATELETS: 277 10*3/uL (ref 145–400)
RBC: 4.52 10*6/uL (ref 3.70–5.45)
RDW: 13.3 % (ref 11.2–14.5)
WBC: 5.8 10*3/uL (ref 3.9–10.3)
lymph#: 1.3 10*3/uL (ref 0.9–3.3)

## 2016-05-14 LAB — COMPREHENSIVE METABOLIC PANEL
ALT: 22 U/L (ref 0–55)
ANION GAP: 11 meq/L (ref 3–11)
AST: 22 U/L (ref 5–34)
Albumin: 4.3 g/dL (ref 3.5–5.0)
Alkaline Phosphatase: 115 U/L (ref 40–150)
BILIRUBIN TOTAL: 0.47 mg/dL (ref 0.20–1.20)
BUN: 16.3 mg/dL (ref 7.0–26.0)
CO2: 29 mEq/L (ref 22–29)
CREATININE: 0.8 mg/dL (ref 0.6–1.1)
Calcium: 10.2 mg/dL (ref 8.4–10.4)
Chloride: 101 mEq/L (ref 98–109)
EGFR: 82 mL/min/{1.73_m2} — ABNORMAL LOW (ref >90)
Glucose: 97 mg/dl (ref 70–140)
POTASSIUM: 4.2 meq/L (ref 3.5–5.1)
Sodium: 140 mEq/L (ref 136–145)
Total Protein: 7.2 g/dL (ref 6.4–8.3)

## 2016-05-14 NOTE — Telephone Encounter (Signed)
Follow up scheduled in 4 mths per 05/14/16 los. Patient was given  A copy of the AVS report and appointment schedule per 05/24/16 los.

## 2016-05-29 ENCOUNTER — Other Ambulatory Visit: Payer: Self-pay | Admitting: *Deleted

## 2016-05-29 DIAGNOSIS — C50912 Malignant neoplasm of unspecified site of left female breast: Secondary | ICD-10-CM

## 2016-05-29 MED ORDER — LETROZOLE 2.5 MG PO TABS: 2.5000 mg | ORAL_TABLET | Freq: Every day | ORAL | 2 refills | Status: DC

## 2016-06-05 ENCOUNTER — Other Ambulatory Visit: Payer: Self-pay | Admitting: Gastroenterology

## 2016-06-05 DIAGNOSIS — J029 Acute pharyngitis, unspecified: Secondary | ICD-10-CM

## 2016-07-01 ENCOUNTER — Other Ambulatory Visit: Payer: Self-pay | Admitting: Gastroenterology

## 2016-07-01 DIAGNOSIS — J029 Acute pharyngitis, unspecified: Secondary | ICD-10-CM

## 2016-07-28 ENCOUNTER — Other Ambulatory Visit: Payer: Self-pay | Admitting: Gastroenterology

## 2016-07-28 DIAGNOSIS — J029 Acute pharyngitis, unspecified: Secondary | ICD-10-CM

## 2016-09-10 ENCOUNTER — Encounter: Payer: Self-pay | Admitting: Family Medicine

## 2016-09-13 NOTE — Progress Notes (Signed)
[place]  [phone] Fax:([phone]) [phone]  Clinic follow up Note   Patient Care Team: McDiarmid, Blane Ohara, MD as PCP - General (Family Medicine) Stark Klein, MD as Consulting Physician (General Surgery) Truitt Merle, MD as Consulting Physician (Hematology) Arloa Koh, MD as Consulting Physician (Radiation Oncology) Rockwell Germany, RN as Registered Nurse Mauro Kaufmann, RN as Registered Nurse Sylvan Cheese, NP as Nurse Practitioner (Nurse Practitioner) 09/17/2016  CHIEF COMPLAINTS:  Follow up left breast cancer  Oncology History   Breast cancer of upper-outer quadrant of left female breast [facility] [place])   Staging form: Breast, AJCC 7th Edition     Clinical stage from 11/10/2014: Stage IIA (T2, N0, M0) - Signed by Truitt Merle, MD on 11/10/2014     Pathologic stage from 11/18/2014: Stage IB (T1c, N41m, cM0) - Signed by JEnid Cutter MD on 12/01/2014       Staging comments: Staged on lumpectomy specimen by Dr. MTresa Moore         Breast cancer of upper-outer quadrant of left female breast ([facility]   10/28/2014 Mammogram    Screening and the diagnostic mammogram shows a 2.1 x 1.3 x 2.1 cm it irregular mass in the left breast 2:00 position, 2 cm from the nipple.      11/01/2014 Initial Biopsy    left breast biopsy showed invasive ductal carcinoma, grade 2, and DCIS       11/01/2014 Receptors her2    ER 100%, PR 100%, HER2/neu negative, KI67 60%       11/10/2014 Clinical Stage    Stage IIA: T2 N0      11/16/2014 Surgery    left breast lumpectomy with SLN biopsy.       11/16/2014 Pathology Results    left breast invasive ductal carcinoma, G3, 1 out of 3 SLN had micrometastasis, (-)LVI, margins negative,  (+) high grade DCIS      11/16/2014 Pathologic Stage    Stage IB: T1c N12m     11/16/2014 Oncotype testing    RS 30, predicts 20% 10-y risk of distant recurrence with tamoxifen alone       12/15/2014 - 02/17/2015 Adjuvant Chemotherapy   Docetaxel 75 mg/m, Cytoxan 600 mg/m, every 3 weeks,  total 4 cycles.      03/21/2015 - 05/04/2015 Radiation Therapy    adjuvant breast radiation: Left breast/ 45 Gy at 1.8 Gy per fraction x 25 fractions.  Left breast boost/ 16 Gy at 2 Gy per fraction x 8 fractions      05/21/2015 -  Anti-estrogen oral therapy    Letrozole 2.5 mg daily      06/30/2015 Survivorship    Survivorship visit completed      HISTORY OF PRESENTING ILLNESS:  DeElnoriffey 56.o. female is here because of her recently diagnosed left breast cancer. She is accompanied by her husband, daughter, mother to our multidisciplinary breast clinic today.  She felt a lump in her left breast one  Month ago, mild tender, she otherwise feels well no other new symptoms. This was developed by her primary care physician and screening mammogram was obtained. Mammogram reviewed a 2.1 cm mass in the left breast 2:00 position, core needle biopsy showed invasive ductal carcinoma, and DCIS.  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 She feels well overall. She does have mild low back pain, does not take any education. She is physically active at home, but does not exercise regularly. No family history  of breast cancer. She was on hormone replacement for the past 8 years, and just stopped progesterone a few weeks ago.  CURRENT THERAPY: Letrozole 58m daily started in 05/2015   INTERIM HISTORY  Mrs RBrethreturns for follow up. She presents to the clinic today reporting she still has hot flashes that can be bad or tolerable. Her joints are still sore but tolerable. She will get pain around her rib cage bilaterally but not every day. Pain is L>R. She has been trying to lose weight and has lost 20 pounds. Her husband has also lost weight. She is still taking calcium vitamin D. She saw Dr. BBarry Dieneslast month.  She will see her PCP in December.    MEDICAL HISTORY:  Past Medical History:  Diagnosis Date  . Allergy   .  Anxiety   . Arthritis   . [place]   . Chemotherapy induced neutropenia ([facility] 12/29/2014  . Depression   . Ex-heavy cigarette smoker (20-39 per day) 09/27/2008   Smoked > 100 cigarettes in her life  Quit smoking 11/04/2012    . GERD (gastroesophageal reflux disease)   . Hot flashes   . Kidney stones   . Left breast mass 10/22/2014   Approx. 2 cm by 2 cm mobile hard mass of left breast at 3 oclock position   . Left wrist pain 04/28/2012  . Mucositis due to antineoplastic therapy 12/29/2014  . Mucositis due to chemotherapy 01/06/2015  . Poor sleep pattern 11/24/2012  . Rash of hands 12/29/2014  . Sore throat 10/27/2012   [facility] vocal cord polyp left, bilateral edema, significant postcricoid edema, c/w chronic tobacco use and chronic reflux. - Recheck 2 months - Encouraged tobacco cessation to reduce risk of laryngeal cancer     . Tubular adenoma of colon 03/2010  . Vocal cord nodule 03/08/2014  . Weight gain 01/05/2013  . Wrist pain     SURGICAL HISTORY: Past Surgical History:  Procedure Laterality Date  . BREAST LUMPECTOMY WITH AXILLARY LYMPH NODE BIOPSY Left 11/16/2014   Procedure: LEFT BREAST LUMPECTOMY WITH AXILLARY LYMPH NODE BIOPSY;  Surgeon: FStark Klein MD;  [facility]  Service: General;  Laterality: Left;  . COLONOSCOPY    . CYST REMOVAL WRIST Left   . tubal ligation  1995   GYN HISTORY  Menarchal: 10 LMP: 48 Contraceptive: 16 HRT: 8 years, stopped in 10/2014  G2P2:  SOCIAL HISTORY: Social History   Social History  . Marital status: Married    Spouse name: N/A  . Number of children: 2  . Years of education: N/A   Occupational History  . truck driver    Social History Main Topics  . Smoking status: Former Smoker    Packs/day: 1.50    Years: 40.00    Types: Cigarettes    Quit date: 11/04/2012  . Smokeless tobacco: Never Used  . Alcohol use Yes     Comment: social users   . Drug use:  No  . Sexual activity: Yes    Partners: Male     Comment: Husband   Other Topics Concern  . 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 Supraventricular tachycardia Mother   . Thyroid disease Mother 86       sluggish thyroid  . Throat cancer Father         smoker  . Stroke Father   . Alzheimer's disease Paternal Grandmother   . Ulcers Maternal Grandmother   . Colon cancer Neg Hx   . Esophageal cancer Neg Hx   . Pancreatic cancer Neg Hx   . Rectal cancer Neg Hx   . Stomach cancer Neg Hx     ALLERGIES:  is allergic to codeine.  MEDICATIONS:  Current Outpatient Prescriptions  Medication Sig Dispense Refill  . aspirin 81 MG tablet Take 81 mg by mouth daily.    . Biotin w/ Vitamins C & E (HAIR/SKIN/NAILS PO) Take 2 tablets by mouth daily.    . calcium carbonate (ANTACID) 500 MG chewable tablet Chew 3 tablets by mouth daily.    . calcium carbonate (OS-CAL) 600 MG TABS tablet Take 600 mg by mouth daily with breakfast.    . diphenhydrAMINE (SOMINEX) 25 MG tablet Take 50 mg by mouth at bedtime as needed for sleep.     Marland Kitchen EVENING PRIMROSE OIL PO Take 1,300 mg by mouth daily.    . fish oil-omega-3 fatty acids 1000 MG capsule Take 2 g by mouth daily. Reported on 05/31/2015    . fluticasone (FLONASE) 50 MCG/ACT nasal spray Place 2 sprays into both nostrils daily. 16 g 0  . Inulin (PHILLIPS FIBER GOOD PO) Take by mouth.    . letrozole (FEMARA) 2.5 MG tablet Take 1 tablet (2.5 mg total) by mouth daily. 90 tablet 2  . Multiple Vitamin (MULTIVITAMIN) tablet Take 1 tablet by mouth daily.     No current facility-administered medications for this visit.     REVIEW OF SYSTEMS:   Constitutional: Denies fevers, chills or abnormal night sweats  (+) hot flashes (+) purposeful weight loss Eyes: Denies blurriness of vision, double vision or watery eyes Ears, nose, mouth, throat, and face: Denies mucositis or sore throat Respiratory: Denies cough, dyspnea or wheezes Cardiovascular: Denies palpitation, chest discomfort or lower extremity swelling Gastrointestinal:  Denies nausea, heartburn or change in bowel habits Skin: Denies abnormal skin rashes Lymphatics: Denies new lymphadenopathy or easy bruising Neurological:Denies numbness, tingling or new weaknesses Behavioral/Psych: Mood is stable, no new changes  Musc: (+) joint pain (hands) (+) lower rib pain All other systems were reviewed with the patient and are negative.  PHYSICAL EXAMINATION: ECOG PERFORMANCE STATUS: 1  Vitals:   09/17/16 0834  BP: 127/65  Pulse: (!) 51  Resp: 18  Temp: 98.2 F (36.8 C)  SpO2: 99%   Filed Weights   09/17/16 0834  Weight: 156 lb 11.2 oz (71.1 kg)     GENERAL:alert, no distress and comfortable SKIN: skin color, texture, turgor are normal, there are a few scattered small skin rash on her scalp EYES: normal, conjunctiva are pink and non-injected, sclera clear OROPHARYNX:no exudate, no erythema and lips, buccal mucosa, and tongue normal  NECK: supple, thyroid normal size, non-tender, without nodularity LYMPH:  no palpable lymphadenopathy in the cervical, axillary or inguinal LUNGS: clear to auscultation and percussion with normal breathing effort HEART: regular rate & rhythm and no murmurs and no lower extremity edema ABDOMEN:abdomen soft, non-tender and normal bowel sounds Musculoskeletal: no cyanosis of digits and no clubbing  PSYCH: alert & oriented x 3 with fluent speech NEURO: no focal motor/sensory deficits Breasts: Breast inspection showed them to be symmetrical  with no nipple discharge. The left breast incision has healed well There is a 2 X 2.5cm, now 1 x 1.5 cm mass underneath the surgical incision on left lateral breast, likely  surgical seroma. Palpation of the right breast and bilateral axilla showed no palpable mass or adenopathy.  LABORATORY DATA:  I have reviewed the data as listed CBC Latest Ref Rng & Units 09/17/2016 05/14/2016 01/16/2016  WBC 3.9 - 10.3 10e3/uL 5.8 5.8 5.7  Hemoglobin 11.6 - 15.9 g/dL 13.5 13.7 12.8  Hematocrit 34.8 - 46.6 % 39.5 39.8 38.8  Platelets 145 - 400 10e3/uL 279 277 272    Recent Labs  01/16/16 0757 05/14/16 0822 09/17/16 0817  NA 140 140 140  K 4.3 4.2 4.2  CO2 _0 GLUCOSE 97 97 96  BUN 14.7 16.3 12.7  CREATININE 0.8 0.8 0.8  CALCIUM 9.7 10.2 10.2  PROT 6.9 7.2 7.1  ALBUMIN 3.7 4.3 4.0  AST _1 ALT _2 ALKPHOS 104 115 104  BILITOT 0.34 0.47 0.62    Pathology report Diagnosis 1. Breast, lumpectomy, Left - INVASIVE DUCTAL CARCINOMA, GRADE 3, SPANNING 2 CM. - DUCTAL CARCINOMA IN SITU, HIGH GRADE. - RESECTION MARGINS ARE NEGATIVE FOR INVASIVE AND IN SITU CARCINOMA. - LOBULAR NEOPLASIA (ATYPICAL LOBULAR HYPERPLASIA). - SEE ONCOLOGY TABLE. 2. Lymph node, sentinel, biopsy, Left axillary #1 - ONE OF ONE LYMPH NODES NEGATIVE FOR CARCINOMA (0/1). 3. Lymph node, sentinel, biopsy, Left axillary #2 - MICROMETASTASIS IN ONE OF ONE LYMPH NODE (1/1). 4. Lymph node, sentinel, biopsy, Left axillary #3 - ONE OF ONE LYMPH NODES NEGATIVE FOR CARCINOMA (0/1).  Microscopic Comment 1. BREAST, INVASIVE TUMOR, WITH LYMPH NODES PRESENT Specimen, including laterality and lymph node sampling (sentinel, non-sentinel): Left breast lump and left axillary sentinel lymph nodes. Procedure: Left breast lumpectomy and left axillary sentinel lymph node biopsies. Histologic type: Invasive ductal carcinoma. Grade: 3 Tubule formation: 3 Nuclear pleomorphism: 3 Mitotic: 2 Tumor size (gross measurement): 2 cm Margins: Invasive, distance to closest margin: 0.2 cm (inferior). In-situ, distance to closest margin: 0.4 cm (superior). If margin positive, focally or broadly:  N/A. Lymphovascular invasion: Definitive invasion not identified. Ductal carcinoma in situ: Present. Grade: III Extensive intraductal component: No. Lobular neoplasia: Present (atypical lobular hyperplasia). Tumor focality: Unifocal. Treatment effect: N/A. Extent of tumor: Confined to breast parenchyma. Lymph nodes: Examined: 3 Sentinel 0 Non-sentinel 3 Total Lymph nodes with metastasis: 1 Isolated tumor cells (< 0.2 mm): 0 Micrometastasis: (> 0.2 mm and < 2.0 mm): 1 Macrometastasis: (> 2.0 mm): 0 Extracapsular extension: No. Breast prognostic profile: Performed on biopsy ([id number]). Her2 will be repeated on current specimen. Estrogen receptor: Positive, strong staining intensity. Progesterone receptor: Positive, strong staining intensity. Her 2 neu: Negative. Ki-67: 60%. Non-neoplastic breast: Fibrocystic change. TNM: pT1c, pN31m Results: HER2 - NEGATIVE RATIO OF HER2/CEP17 SIGNALS 1.15 AVERAGE HER2 COPY NUMBER PER CELL 3.15  Oncotype Dx RS: 30, predicts 20% 10-y risk of distant recurrence with tamoxifen alone   RADIOGRAPHIC STUDIES: I have personally reviewed the radiological images as listed and agreed with the findings in the report.  Diagnostic Mammogram 11/07/2015 IMPRESSION: No evidence of malignancy in either breast.  DEXA Scan 11/07/2015 The BMD measured at Femur Neck Right is 0.868 g/cm2 with a T-score of -1.2. This patient is considered osteopenic according to [facility] criteria. Site Region Measured Date Measured Age YA BMD Significant CHANGE T-score DualFemur Neck Right 11/07/2015    55.0         -  1.2    0.868 g/cm2 AP Spine  L1-L4      11/07/2015    [age]         0.0     1.196 g/cm2  ASSESSMENT & PLAN:  56 y.o. Caucasian female, postmenopausal, presented with palpable left breast mass  1. Left breast invasive ductal carcinoma, pT1cN61mM0, stage IB, grade 3, ER positive, PR positive, HER-2 negative, (+) DCIS -I previously  reviewed her surgical pathology results in great details with patient and her husband. -She is now status post lumpectomy and sentinel lymph node biopsy. Surgical margins were negative. She has early-stage breast cancer, likely cured by complete surgical resection -I previously discussed the risk of cancer recurrence after her surgery. The Oncotype DX test results were explained to her in details. Her recurrence score is 30, which predicts 20% 10 year risk of distant recurrence with tamoxifen alone. She has received adjuvant chemotherapy with docetaxel and Cytoxan. -She is on adjuvant letrozole, tolerating well, has mild-to-moderate hot flash, and mild arthralgia. Overall side effects are manageable so far. She'll continue letrozole, plan for a total 5-7 years. -She will continue breast cancer surveillance. She is due for mammogram in 11/2016 -Lab results reviewed with her, her CBC and CMP are within normal limits. Her physical exam was unremarkable except the surgical scar which has improved in the left breast. She is clinically doing well, so for recurrence. -she will see Dr. BBarry Dienesin 05/2016, and I will f/u with her once a year.    2. Anxiety and depression -stable. She takes xanax as needed.   3. GERD -She is on omeprazole, and takes Tums sometime   4. Hot flash -Slightly worse after she started letrozole. So far manageable.  5. Arthralgia and bilateral rib cage pain -In her hands, tolerable -Lower b/l ribs. I have encouraged her to wear bras without an under wire, like sports bras. I also recommended a heating pad and tylenol or ibuprofen.  -if her rib pain is getting worse, I will consider a bone scan to rule out bone mets. She knows to call me. The mild has been mild and intermittent   6. Osteopenia -Her bone density scan from October 2017 showed osteopenia, no high risk for fracture. -She will continue calcium and vitamin D -She is due for Bone density in 2019  Plan -refill  Letrozole today [facility]u in one year, she sees Dr. BBarry Dienesin 05/2016 and see her PCP with lab in 01/2017    All questions were answered. The patient knows to call the clinic with any problems, questions or concerns.  I spent 20 minutes counseling the patient face to face. The total time spent in the appointment was 25 minutes and more than 50% was on counseling.  This document serves as a record of services personally performed by YTruitt Merle MD. It was created on her behalf by AJoslyn Devon a trained medical scribe. The creation of this record is based on the scribe's personal observations and the provider's statements to them. This document has been checked and approved by the attending provider.   I have reviewed the above documentation for accuracy and completeness and I agree with the above.   FTruitt Merle MD 09/17/2016

## 2016-09-17 ENCOUNTER — Other Ambulatory Visit (HOSPITAL_BASED_OUTPATIENT_CLINIC_OR_DEPARTMENT_OTHER): Payer: Managed Care, Other (non HMO)

## 2016-09-17 ENCOUNTER — Telehealth: Payer: Self-pay | Admitting: Hematology

## 2016-09-17 ENCOUNTER — Ambulatory Visit (HOSPITAL_BASED_OUTPATIENT_CLINIC_OR_DEPARTMENT_OTHER): Payer: Managed Care, Other (non HMO) | Admitting: Hematology

## 2016-09-17 ENCOUNTER — Encounter: Payer: Self-pay | Admitting: Hematology

## 2016-09-17 VITALS — BP 127/65 | HR 51 | Temp 98.2°F | Resp 18 | Ht 62.0 in | Wt 156.7 lb

## 2016-09-17 DIAGNOSIS — M8580 Other specified disorders of bone density and structure, unspecified site: Secondary | ICD-10-CM | POA: Diagnosis not present

## 2016-09-17 DIAGNOSIS — N951 Menopausal and female climacteric states: Secondary | ICD-10-CM

## 2016-09-17 DIAGNOSIS — C50412 Malignant neoplasm of upper-outer quadrant of left female breast: Secondary | ICD-10-CM

## 2016-09-17 DIAGNOSIS — C50912 Malignant neoplasm of unspecified site of left female breast: Secondary | ICD-10-CM

## 2016-09-17 DIAGNOSIS — M25542 Pain in joints of left hand: Secondary | ICD-10-CM | POA: Diagnosis not present

## 2016-09-17 DIAGNOSIS — F418 Other specified anxiety disorders: Secondary | ICD-10-CM

## 2016-09-17 DIAGNOSIS — M25541 Pain in joints of right hand: Secondary | ICD-10-CM

## 2016-09-17 DIAGNOSIS — R0781 Pleurodynia: Secondary | ICD-10-CM

## 2016-09-17 DIAGNOSIS — Z170 Estrogen receptor positive status [ER+]: Secondary | ICD-10-CM | POA: Diagnosis not present

## 2016-09-17 LAB — CBC WITH DIFFERENTIAL/PLATELET
BASO%: 0.7 % (ref 0.0–2.0)
Basophils Absolute: 0 10*3/uL (ref 0.0–0.1)
EOS%: 2.6 % (ref 0.0–7.0)
Eosinophils Absolute: 0.2 10*3/uL (ref 0.0–0.5)
HCT: 39.5 % (ref 34.8–46.6)
HEMOGLOBIN: 13.5 g/dL (ref 11.6–15.9)
LYMPH%: 22.6 % (ref 14.0–49.7)
MCH: 30.7 pg (ref 25.1–34.0)
MCHC: 34.2 g/dL (ref 31.5–36.0)
MCV: 89.7 fL (ref 79.5–101.0)
MONO#: 0.4 10*3/uL (ref 0.1–0.9)
MONO%: 6.5 % (ref 0.0–14.0)
NEUT%: 67.6 % (ref 38.4–76.8)
NEUTROS ABS: 3.9 10*3/uL (ref 1.5–6.5)
Platelets: 279 10*3/uL (ref 145–400)
RBC: 4.41 10*6/uL (ref 3.70–5.45)
RDW: 13.2 % (ref 11.2–14.5)
WBC: 5.8 10*3/uL (ref 3.9–10.3)
lymph#: 1.3 10*3/uL (ref 0.9–3.3)

## 2016-09-17 LAB — COMPREHENSIVE METABOLIC PANEL
ALBUMIN: 4 g/dL (ref 3.5–5.0)
ALK PHOS: 104 U/L (ref 40–150)
ALT: 19 U/L (ref 0–55)
ANION GAP: 10 meq/L (ref 3–11)
AST: 23 U/L (ref 5–34)
BILIRUBIN TOTAL: 0.62 mg/dL (ref 0.20–1.20)
BUN: 12.7 mg/dL (ref 7.0–26.0)
CO2: 29 meq/L (ref 22–29)
Calcium: 10.2 mg/dL (ref 8.4–10.4)
Chloride: 102 mEq/L (ref 98–109)
Creatinine: 0.8 mg/dL (ref 0.6–1.1)
EGFR: 80 mL/min/{1.73_m2} — AB (ref >90)
GLUCOSE: 96 mg/dL (ref 70–140)
POTASSIUM: 4.2 meq/L (ref 3.5–5.1)
SODIUM: 140 meq/L (ref 136–145)
TOTAL PROTEIN: 7.1 g/dL (ref 6.4–8.3)

## 2016-09-17 MED ORDER — LETROZOLE 2.5 MG PO TABS: 2.5000 mg | ORAL_TABLET | Freq: Every day | ORAL | 2 refills | Status: DC

## 2016-09-17 NOTE — Telephone Encounter (Signed)
Scheduled apopt per 8/13 los - Gave patient AVS and calender per los.

## 2016-11-26 ENCOUNTER — Ambulatory Visit
Admission: RE | Admit: 2016-11-26 | Discharge: 2016-11-26 | Disposition: A | Discharge status: Home / Self Care | Payer: 59 | Source: Ambulatory Visit | Attending: Hematology | Admitting: Hematology

## 2016-11-26 DIAGNOSIS — Z170 Estrogen receptor positive status [ER+]: Principal | ICD-10-CM

## 2016-11-26 DIAGNOSIS — C50412 Malignant neoplasm of upper-outer quadrant of left female breast: Secondary | ICD-10-CM

## 2016-11-26 HISTORY — DX: Personal history of irradiation: Z92.3

## 2016-11-26 HISTORY — DX: Personal history of antineoplastic chemotherapy: Z92.21

## 2017-01-08 IMAGING — MG MM DIAG BREAST TOMO BILATERAL
12 of 17 series · 12 of 40 positions shown · non-contrast
Comparison: Previous exams including most recent screening
mammogram of 12/22/2012.

CLINICAL DATA: Patient presents today with a palpable mass within
the upper-outer quadrant of the left breast.

EXAM:
DIGITAL DIAGNOSTIC BILATERAL MAMMOGRAM WITH 3D TOMOSYNTHESIS WITH
CAD
ULTRASOUND LEFT BREAST

[L MLO (1 of 2)]
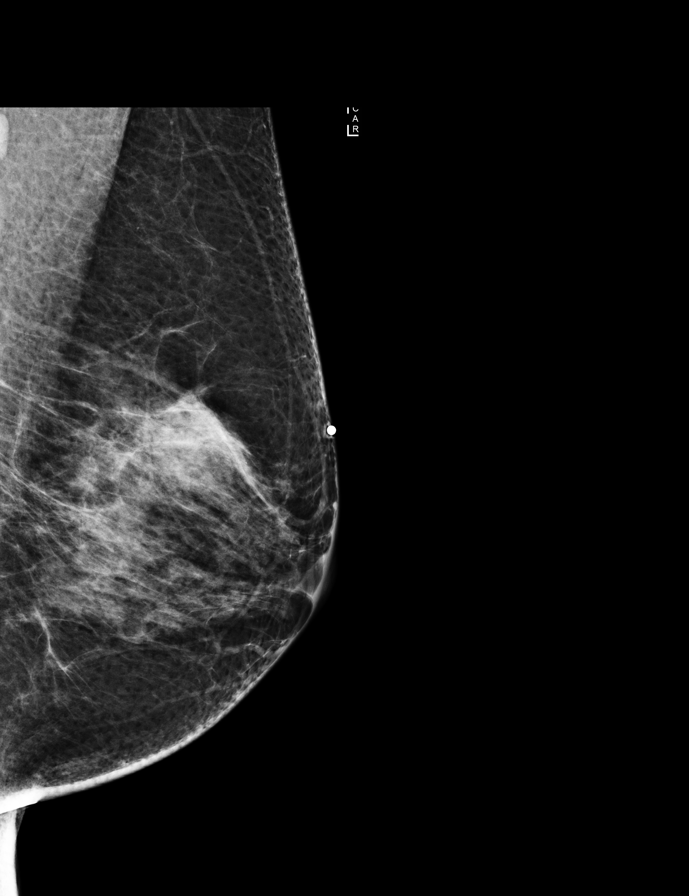

[R CC]
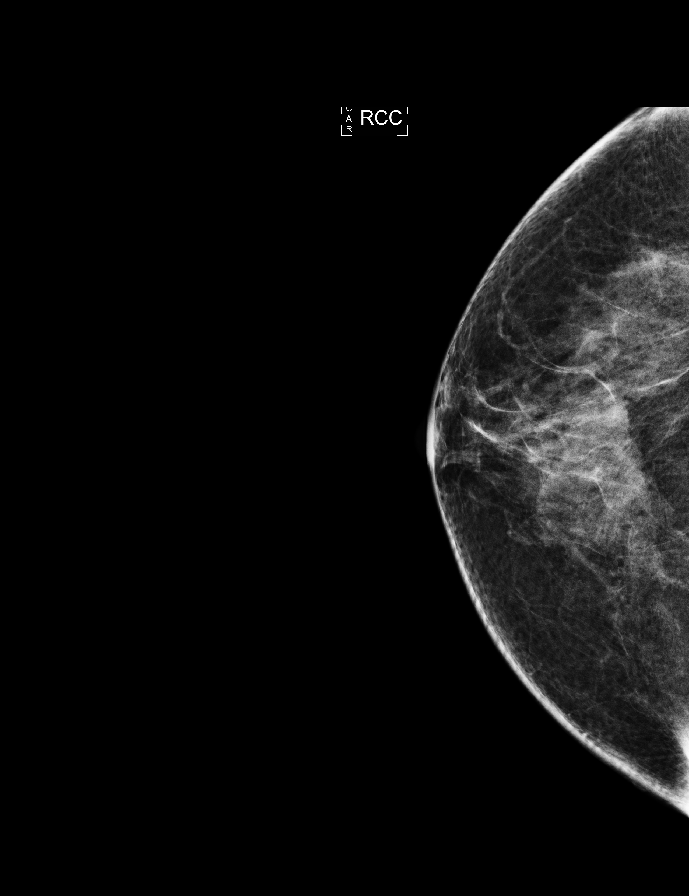

[L MLO (2 of 2)]
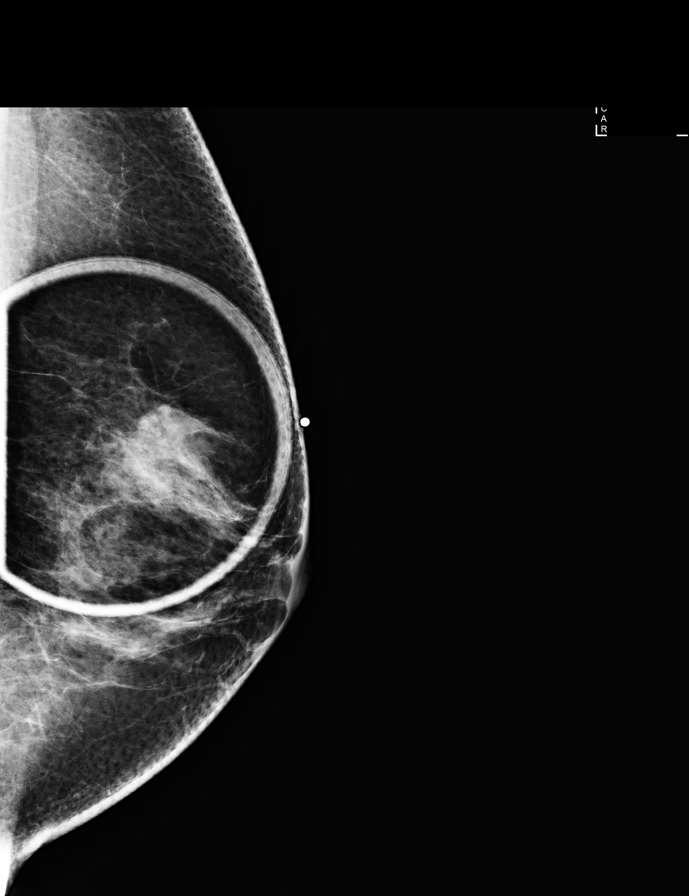

[L CC]
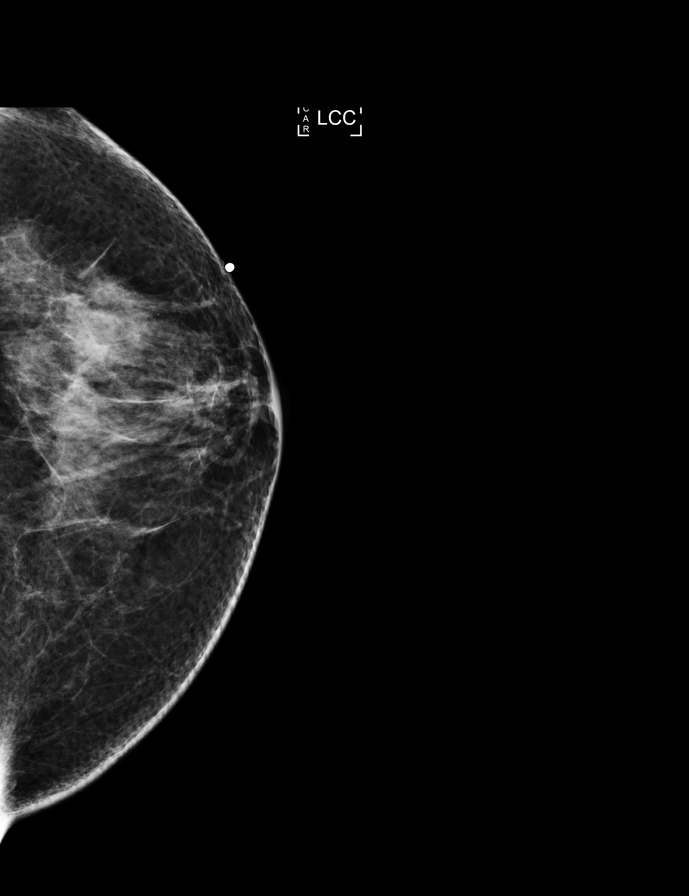

[L TAN]
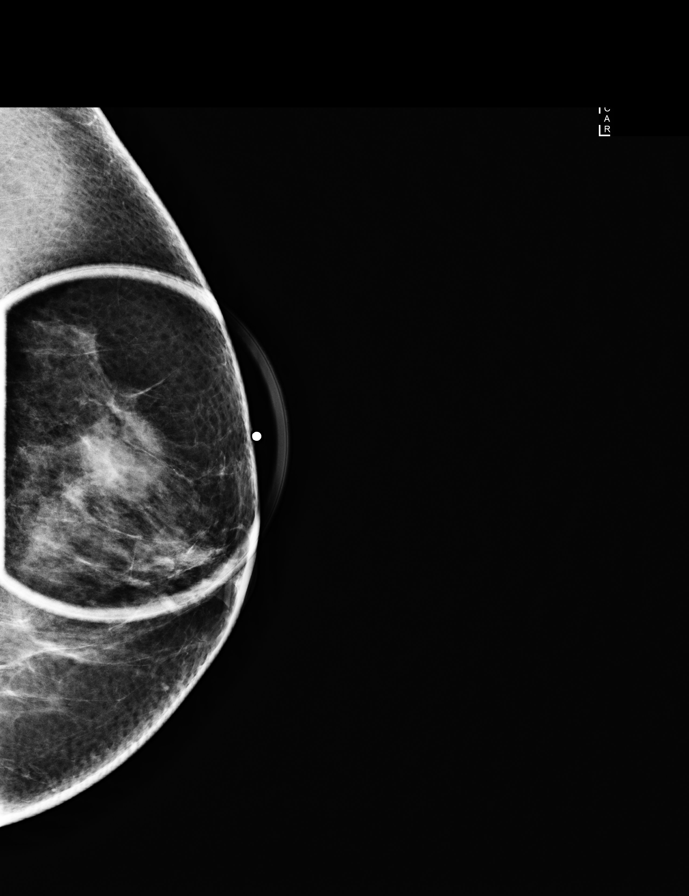

[R MLO]
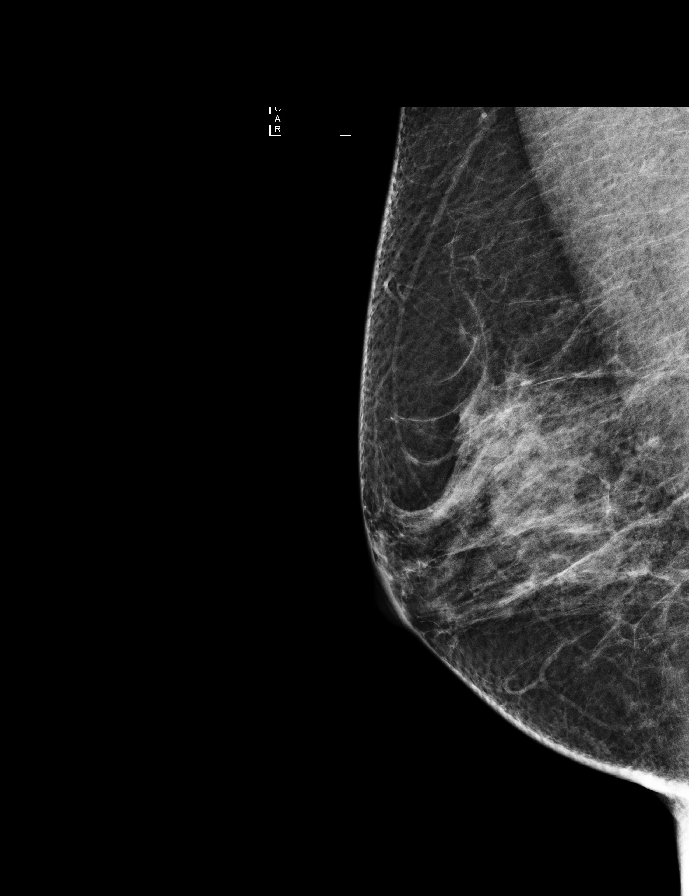

[L CC tomo]
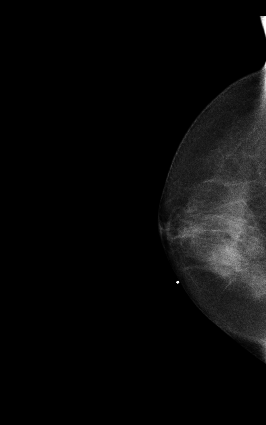

[L TAN tomo]
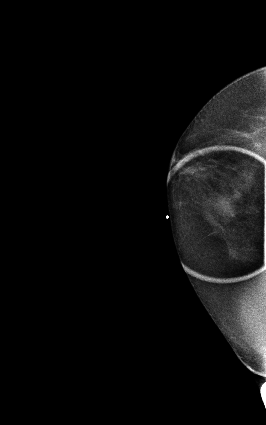

[R MLO tomo]
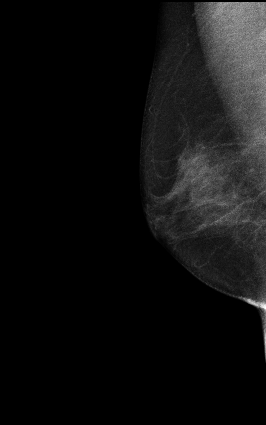

[R CC tomo]
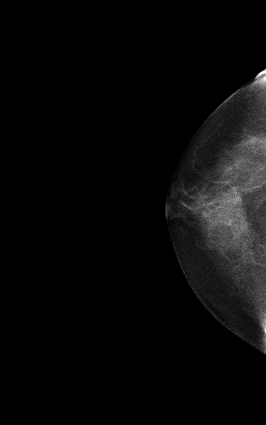

[L MLO tomo (1 of 2)]
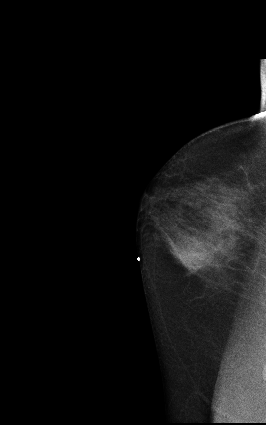

[L MLO tomo (2 of 2) · tomo slice 35/68.0]
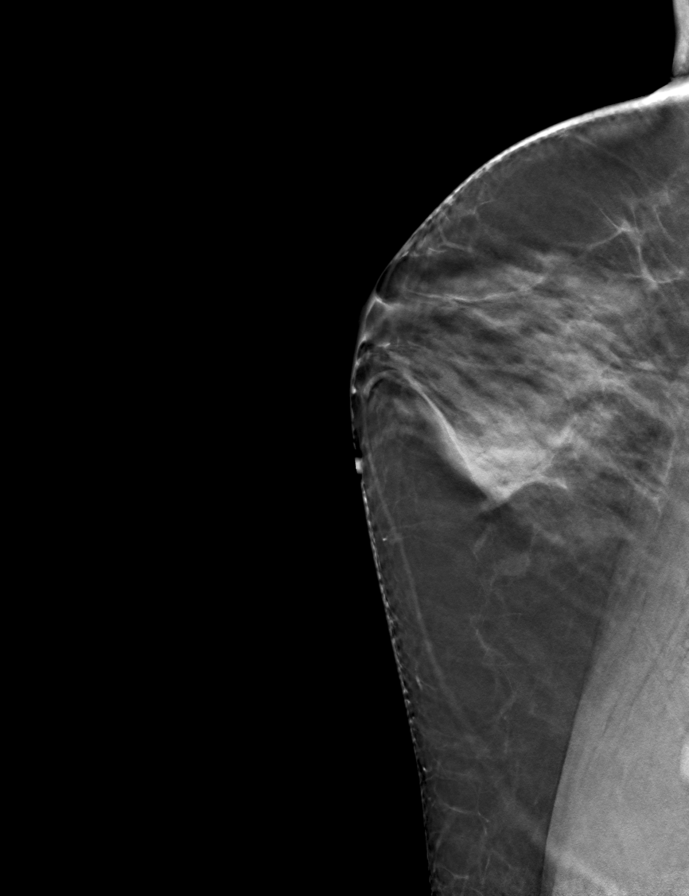

[12 of 40 positions shown; findings below may reference images not displayed]

ACR Breast Density Category c: The breast tissue is heterogeneously
dense, which may obscure small masses.
FINDINGS: Bilateral CC and MLO projections were obtained with tomosynthesis.
Additional spot compression views, with tomosynthesis, were obtained
for the upper-outer quadrant palpable abnormality.

There is a spiculated mass within the upper-outer quadrant of the
left breast, at posterior depth, confirmed on spot compression views
with tomosynthesis, measuring approximately 2 cm greatest dimension,
corresponding to the palpable abnormality.

There are no other dominant masses, suspicious calcifications or
secondary signs of malignancy identified within either breast.

Mammographic images were processed with CAD.

On physical exam, hard fixed mass is felt within the upper-outer
quadrant of the left breast.

Targeted ultrasound is performed, showing an irregular hypoechoic
mass within the left breast at the 2 o'clock axis, 2 cm from nipple,
measuring 2.1 x 1.3 x 2.1 cm, corresponding to the mammographic
finding and palpable abnormality.

Left axilla was evaluated with ultrasound. No enlarged or
morphologically abnormal lymph nodes are identified in the left
axilla.
IMPRESSION: 1. Irregular mass within the left breast at the 2 o'clock axis, 2 cm
from the nipple, measuring 2.1 x 1.3 x 2.1 cm, corresponding to the
palpable abnormality. This is a highly suspicious finding for which
ultrasound-guided biopsy is recommended.

2. No enlarged or morphologically abnormal lymph nodes identified
within the left axilla.

RECOMMENDATION:
Ultrasound-guided core biopsy of the left breast mass at the 2
o'clock axis, 2 cm from the nipple, measuring 2.1 x 1.3 x 2.1 cm.

Ultrasound-guided core biopsy is scheduled for [REDACTED] at 1
p.m.

I have discussed the findings and recommendations with the patient.
Results were also provided in writing at the conclusion of the
visit. If applicable, a reminder letter will be sent to the patient
regarding the next appointment.

BI-RADS CATEGORY  5: Highly suggestive of malignancy.

## 2017-01-12 IMAGING — MG MM DIAGNOSTIC UNILATERAL L
2 series · 2 of 2 positions shown · non-contrast
Comparison: Previous exam(s).

CLINICAL DATA: Status post ultrasound-guided core biopsy of mass in
the 2 o'clock location of the left breast.

EXAM:
DIAGNOSTIC LEFT MAMMOGRAM POST ULTRASOUND BIOPSY

[L CC]
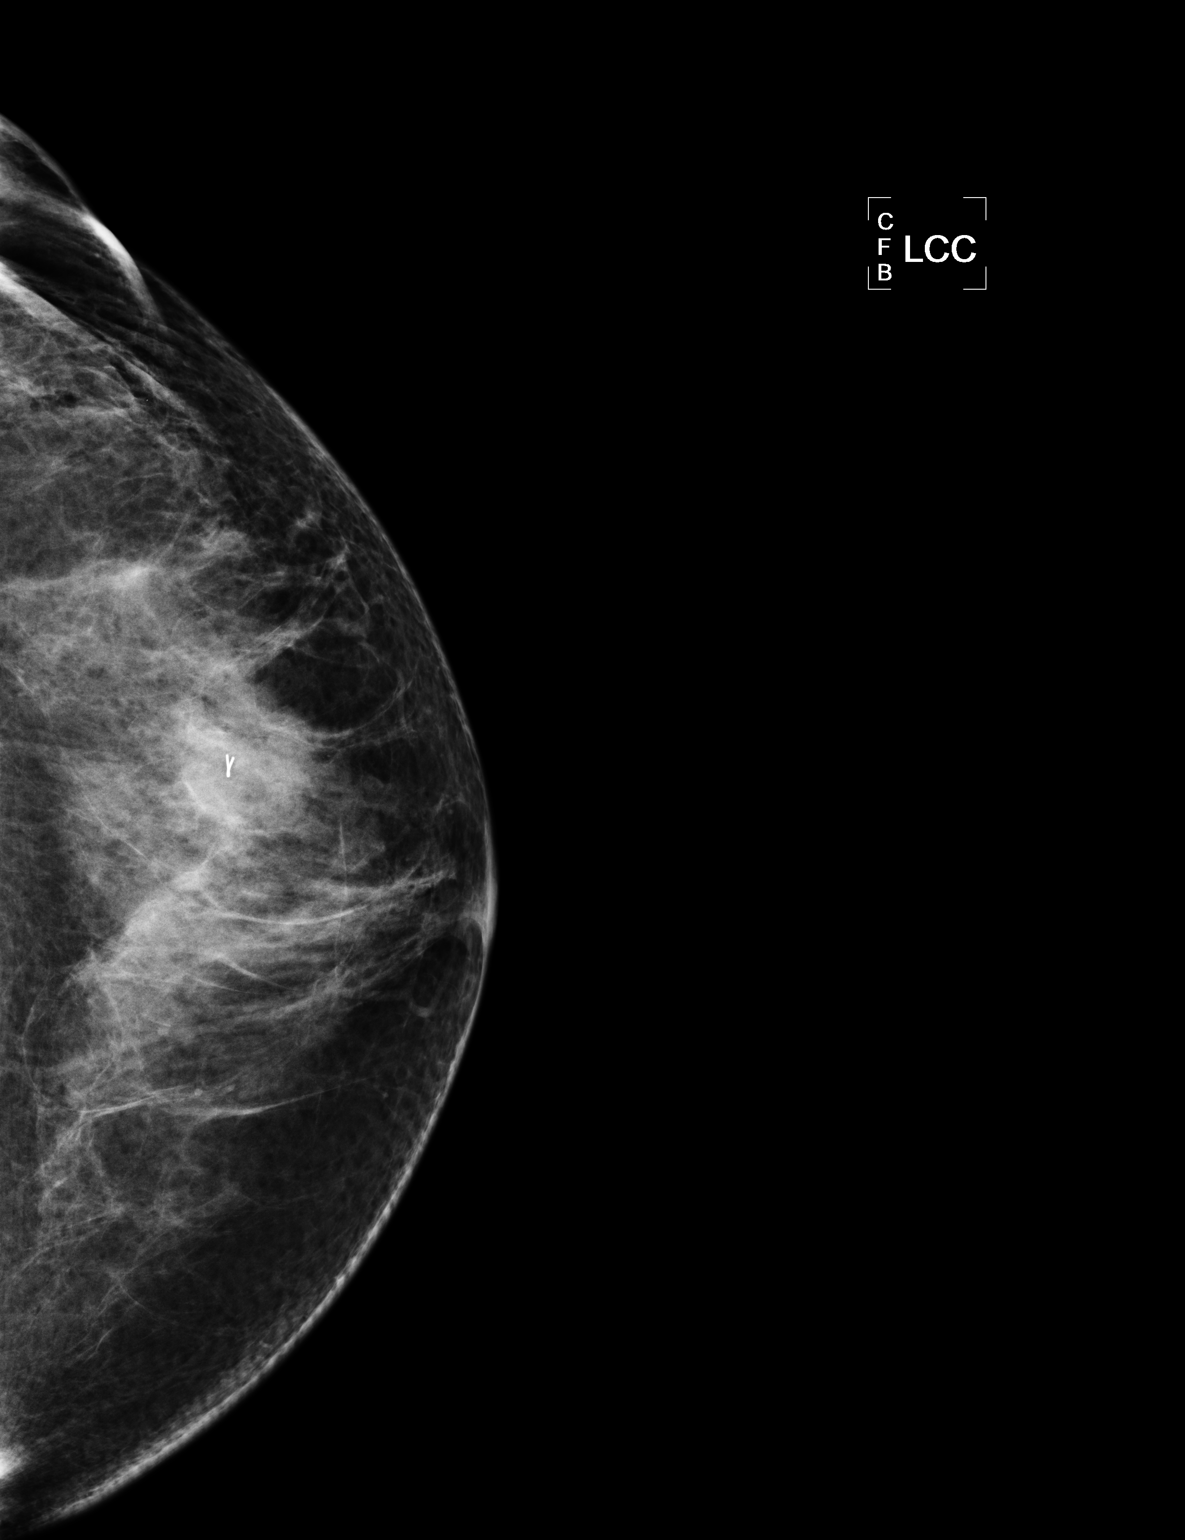

[L ML]
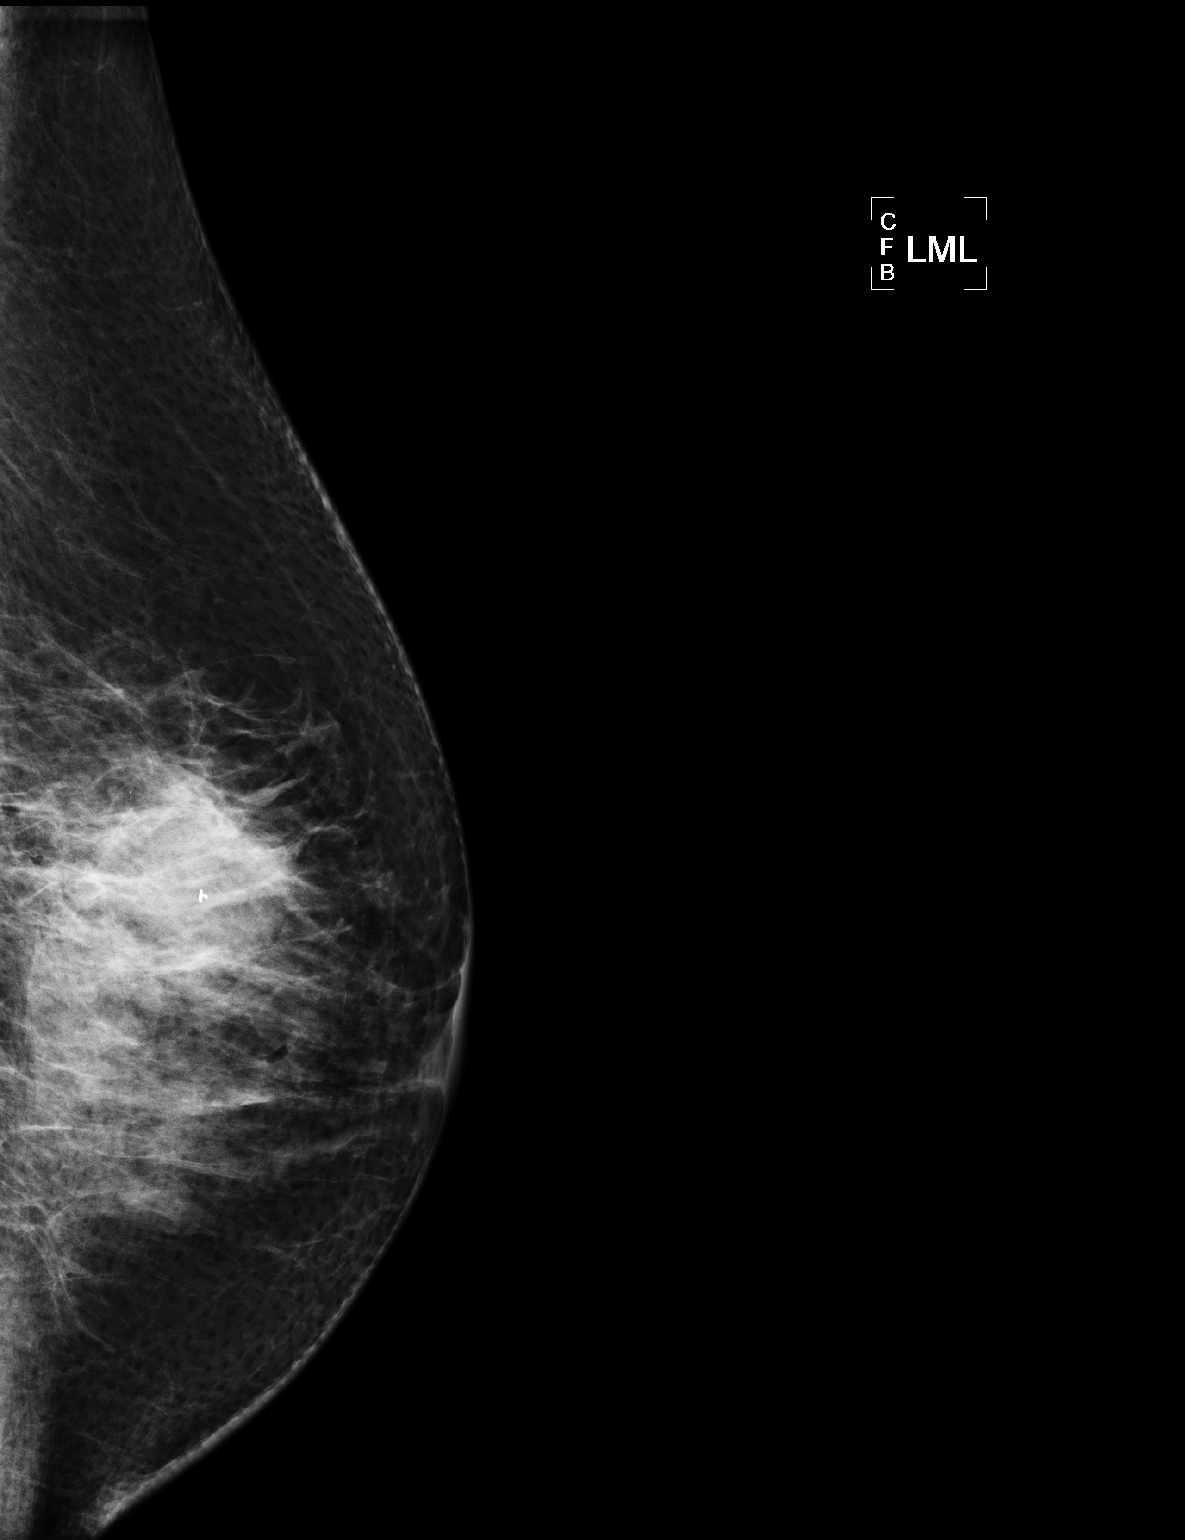

[2 of 2 positions shown; findings below may reference images not displayed]

FINDINGS: Mammographic images were obtained following ultrasound guided biopsy
of mass in the 2 o'clock location of the left breast. A ribbon
shaped clip is identified in the upper-outer quadrant of the left
breast as expected following biopsy.
IMPRESSION: Tissue marker clip is in expected location after biopsy.

Final Assessment: Post Procedure Mammograms for Marker Placement

## 2017-01-12 IMAGING — US US BREAST BX W LOC DEV 1ST LESION IMG BX SPEC US GUIDE*L*
1 series · 11 of 11 positions shown · non-contrast
Comparison: Previous exam(s).

ADDENDUM:
Pathology reveals Grade II invasive mammary carcinoma and mammary
carcinoma in situ of the Left breast. This was found to be
concordant by Dr. Tadg Iwaniec. Pathology result were discussed with
the patient via telephone. The patient reported tenderness at the
biopsy site and is doing well otherwise. Post biopsy care and
instructions were reviewed and questions were answered. The patient
was encouraged to call The [REDACTED] with
any additional questions and or concerns. The patient was referred
to [REDACTED] Multi-disciplinary Clinic at [REDACTED] on November 10, 2014.

Pathology results reported by Sayeed Resendez RN on November 02, 2014.
CLINICAL DATA: Patient returns for ultrasound-guided core biopsy of
left breast mass.
EXAM:
ULTRASOUND GUIDED LEFT BREAST CORE NEEDLE BIOPSY

[Series 1: us breast bx w loc dev 1st lesion img bx spec us g · 0.07mm/px · 11 of 11 slices shown]
[im 1/11]
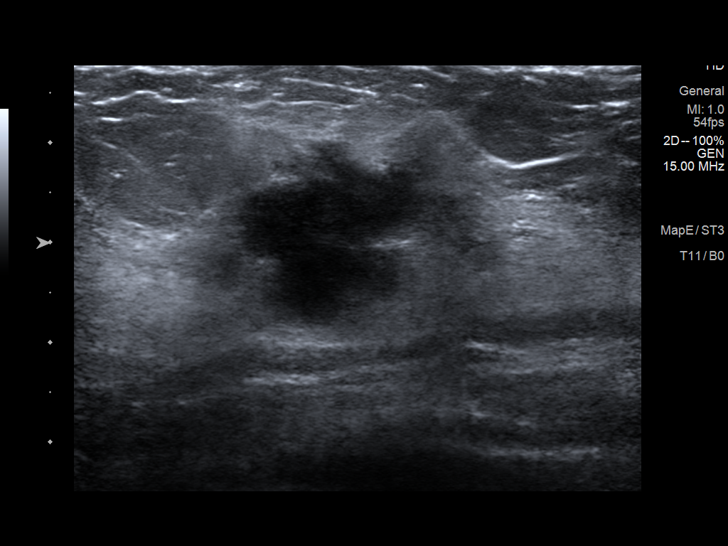
[im 2/11]
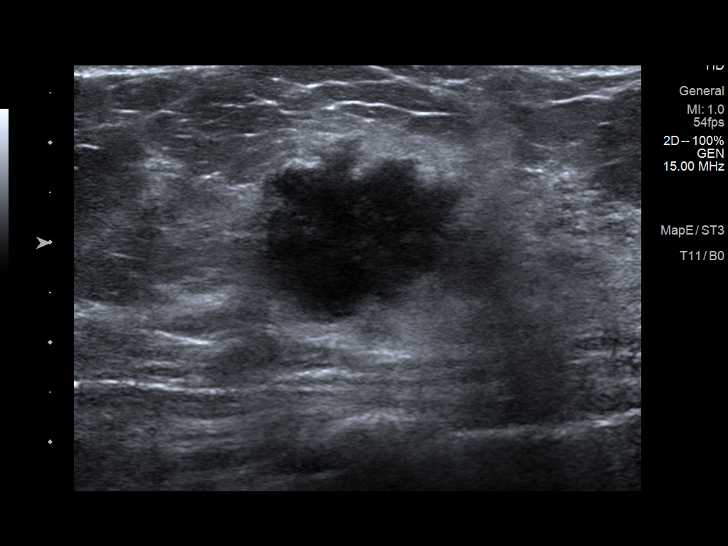
[im 3/11]
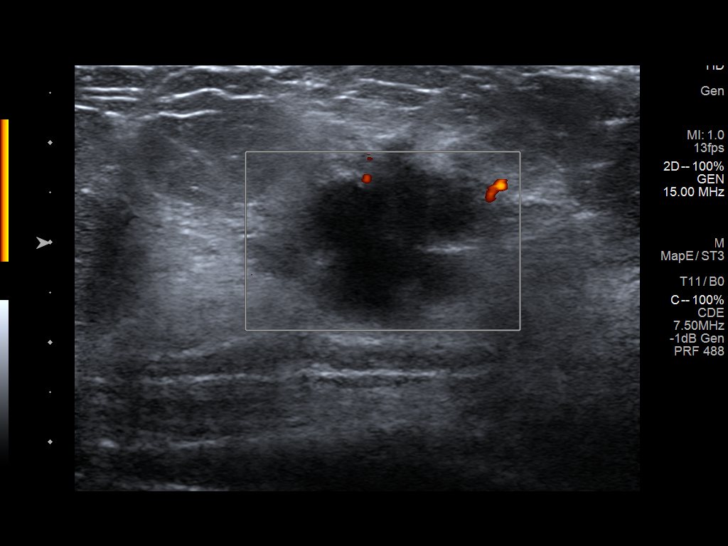
[im 4/11]
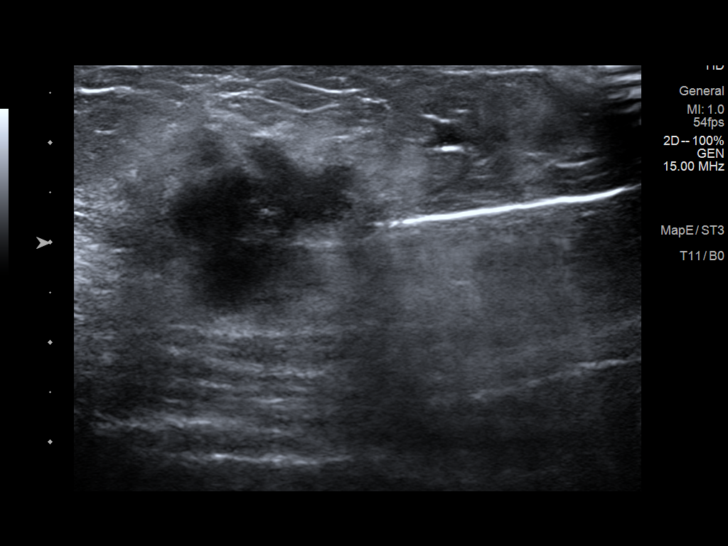
[im 5/11]
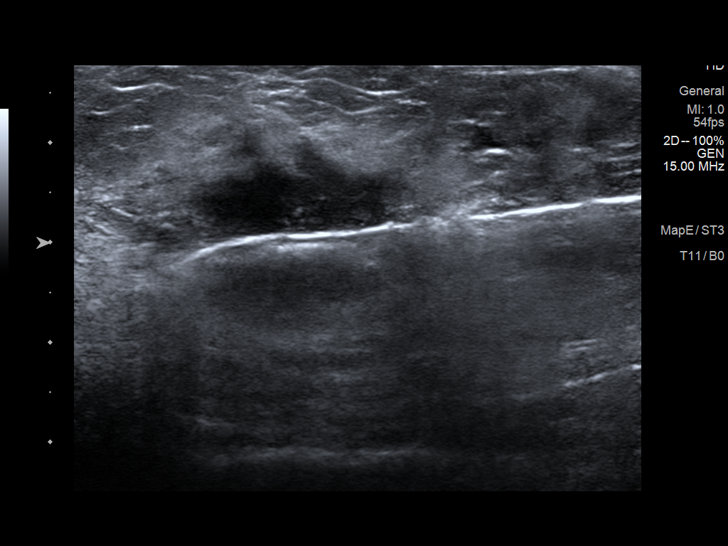
[im 6/11]
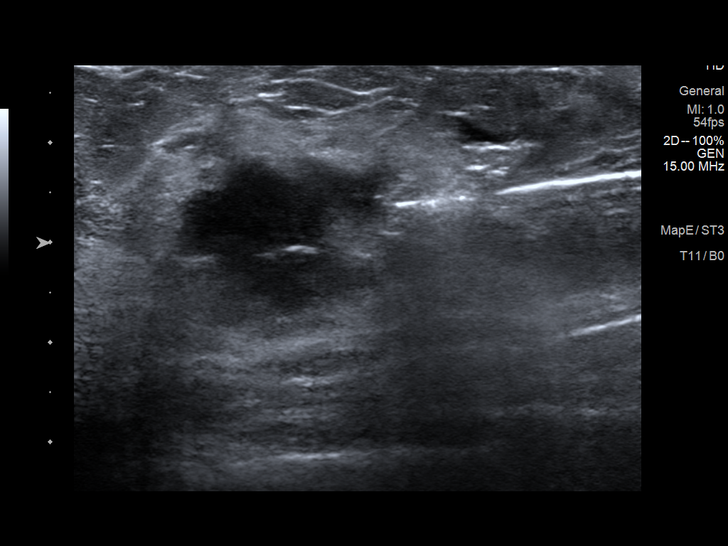
[im 7/11]
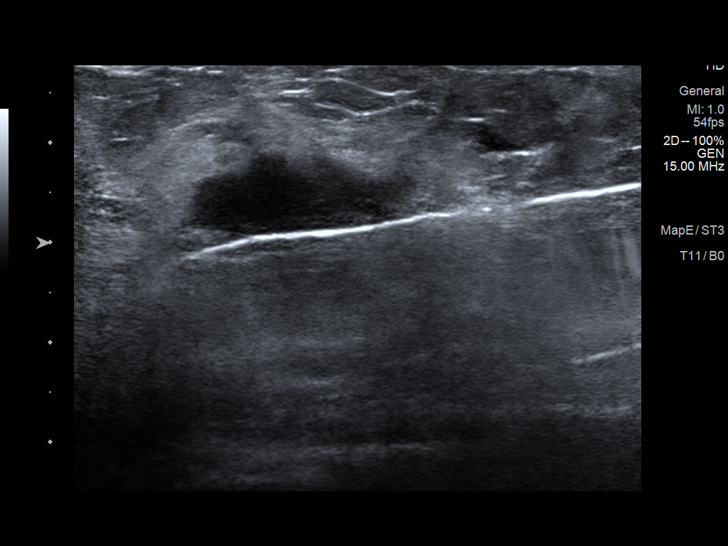
[im 8/11]
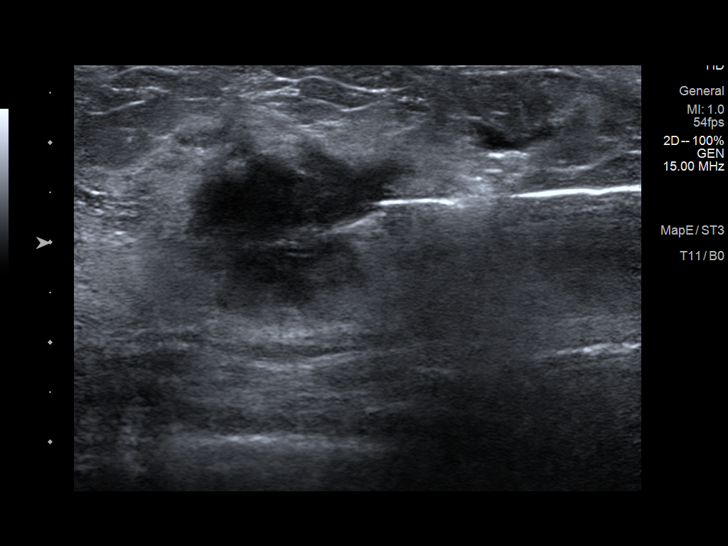
[im 9/11]
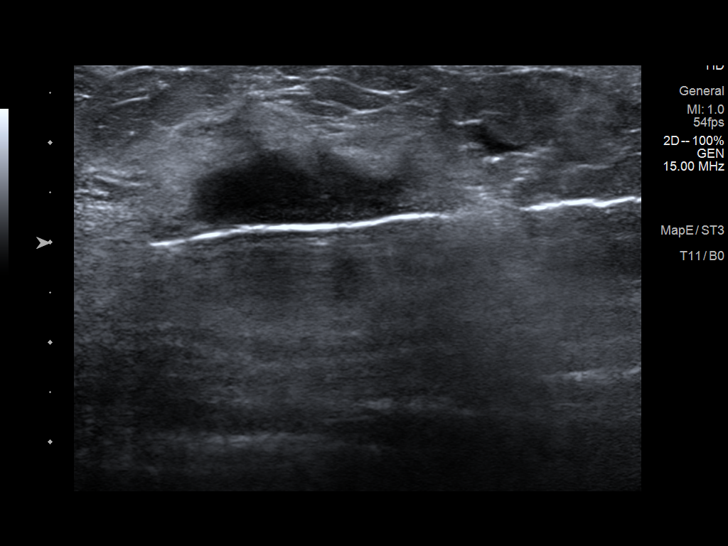
[im 10/11]
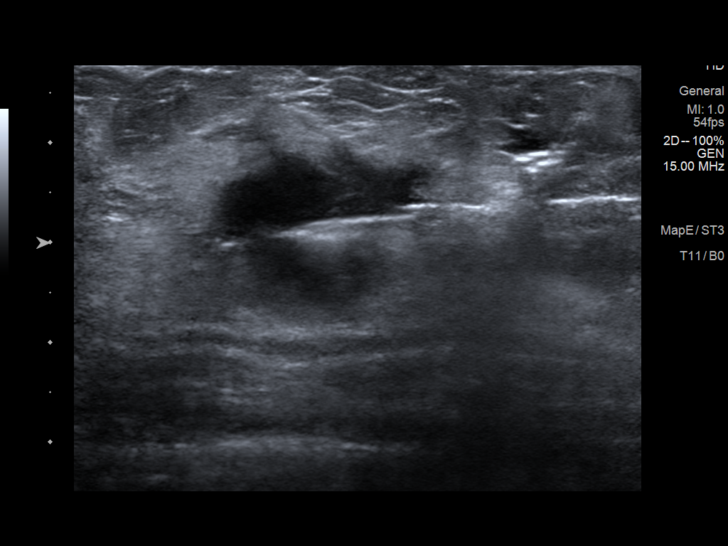
[im 11/11]
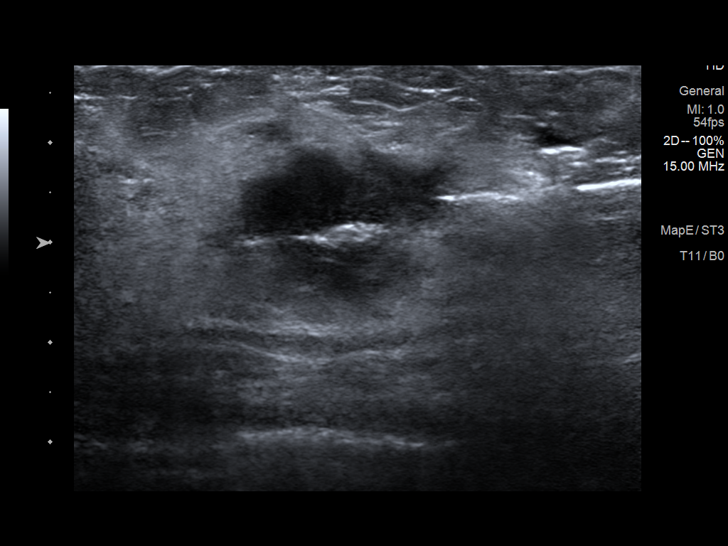

[11 of 11 positions shown; findings below may reference images not displayed]



Using sterile technique and 2% Lidocaine as local anesthetic, under
direct ultrasound visualization, a 12 gauge Hyellamada device was
used to perform biopsy of mass in the 2 o'clock location of the left
breast using a lateral approach. At the conclusion of the procedure
a ribbon shaped tissue marker clip was deployed into the biopsy
cavity. Follow up 2 view mammogram was performed and dictated
separately.
IMPRESSION: Ultrasound guided biopsy of left breast mass. No apparent
complications.

## 2017-01-21 ENCOUNTER — Ambulatory Visit (INDEPENDENT_AMBULATORY_CARE_PROVIDER_SITE_OTHER): Payer: 59 | Admitting: Internal Medicine

## 2017-01-21 ENCOUNTER — Encounter: Payer: Self-pay | Admitting: Internal Medicine

## 2017-01-21 ENCOUNTER — Other Ambulatory Visit: Payer: Self-pay

## 2017-01-21 VITALS — BP 104/78 | HR 58 | Temp 98.0°F | Wt 163.0 lb

## 2017-01-21 DIAGNOSIS — E785 Hyperlipidemia, unspecified: Secondary | ICD-10-CM

## 2017-01-21 DIAGNOSIS — Z23 Encounter for immunization: Secondary | ICD-10-CM

## 2017-01-21 DIAGNOSIS — Z01419 Encounter for gynecological examination (general) (routine) without abnormal findings: Secondary | ICD-10-CM | POA: Diagnosis not present

## 2017-01-21 DIAGNOSIS — R0781 Pleurodynia: Secondary | ICD-10-CM | POA: Diagnosis not present

## 2017-01-21 NOTE — Progress Notes (Signed)
56 y.o. year old female presents for well woman/preventative visit and annual GYN examination.  Acute Concerns:  Rib Pain: Has been going on for the last 1 year. Located over her lower ribs. Worse with sitting for a long period of time. Better with sitting up straight. Not worse with taking a deep breath. The pain comes and goes, but is present almost every day. Has not tried any medications for this. No shortness of breath, no lower extremity edema, no cough.  HLD: Has been told in the past that her cholesterol is high. Taking fish oil. Not on a statin.  Diet: Has been working on cutting down on the amount of food she eats and making better choices. 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  Exercise: [place] farther away from buildings, which forces her to walk more. Also goes on walks when she stops for breaks while driving. Also states she is very active at home when she is not on the road.   Sexual/Birth History: Sexually active with husband  Birth Control: None- postmenopausal  Social:  Social History   Socioeconomic History  . Marital status: Married    Spouse name: Not on file  . Number of children: 2  . Years of education: Not on file  . Highest education level: Not on file  Social Needs  . Financial resource strain: Not on file  . Food insecurity - worry: Not on file  . Food insecurity - inability: Not on file  . Transportation needs - medical: Not on file  . Transportation needs - non-medical: Not on file  Occupational History  . Occupation: truck Geophysicist/field seismologist  Tobacco Use  . Smoking status: Former Smoker    Packs/day: 1.50    Years: 40.00    Pack years: 60.00    Types: Cigarettes    Last attempt to quit: 11/04/2012    Years since quitting: 4.2  . Smokeless tobacco: Never Used  Substance and Sexual Activity  . Alcohol use: Yes    Comment: social users   . Drug use: No  . Sexual activity: Yes    Partners: Male    Comment: Husband  Other Topics  Concern  . Not on file  Social History Narrative   Lives with husband in [place].   Children: 2 grown children in [place]: truck Geophysicist/field seismologist (took new job with husband with [employer] in [place] in 11/2013)             Immunization: Immunization History  Administered Date(s) Administered  . Influenza Split 11/26/2011  . Influenza,inj,Quad PF,6+ Mos 11/24/2012  . Influenza-Unspecified 11/20/2015  . Pneumococcal Conjugate-13 03/08/2014  . Td 09/27/2008  . Zoster 11/20/2015    Cancer Screening:  Pap Smear: Done 03/2014 and showed ASC-US but negative for HPV. Needs repeat pap  in 03/2017.  Mammogram: Done 09/2016; has a history of breast cancer s/p lumpectomy. Diagnostic  mammogram was recommended in 1 year- will need this in 11/2017  Colonoscopy: Done 06/2015 and had a polyp- needs repeat colonoscopy in 5 years  Physical Exam: VITALS: Reviewed GEN: Pleasant female, NAD HEENT: Normocephalic, PERRL, EOMI, no scleral icterus, bilateral TM pearly grey, nasal septum midline, MMM, uvula midline, no anterior or posterior lymphadenopathy, no thyromegaly CARDIAC:RRR, S1 and S2 present, no murmur, no heaves/thrills, no tenderness to palpation of ribs RESP: CTAB, normal effort ABD: soft, no tenderness, normal bowel sounds EXT: No edema, 2+ radial and DP pulses SKIN: no rash  ASSESSMENT &  PLAN: 56 y.o. female presents for annual well woman/preventative exam.   Rib Pain: Has been going on for 1 year. Likely musculoskeletal in nature, given that her pain is worse with sitting with poor posture and better with sitting with good posture. She is concerned about this because she has history of breast cancer. - Will order 2 view chest x-ray to rule out something unusual like a bony met - Can use Ibuprofen or Tylenol as needed  HLD: Last lipid panel 01/2016 with Chol 220, HDL 45, LDL 100, TG 374. Taking fish oil. - Continue fish oil - Recheck lipid panel  Hyman Bible,  MD PGY-3

## 2017-01-21 NOTE — Patient Instructions (Addendum)
It was wonderful to meet you today!  We have checked a cholesterol panel for you. We will let you know what these results are.  I have ordered a chest x-ray for you. You can go over to [facility] to have this done whenever it is convenient for you. Their address is [address]  Please come back in February for a pap smear.  -Dr. Brett Albino

## 2017-01-22 LAB — LIPID PANEL
CHOL/HDL RATIO: 3.7 ratio (ref 0.0–4.4)
Cholesterol, Total: 223 mg/dL — ABNORMAL HIGH (ref 100–199)
HDL: 60 mg/dL (ref >39)
LDL Calculated: 133 mg/dL — ABNORMAL HIGH (ref 0–99)
TRIGLYCERIDES: 151 mg/dL — AB (ref 0–149)
VLDL Cholesterol Cal: 30 mg/dL (ref 5–40)

## 2017-01-23 ENCOUNTER — Telehealth: Payer: Self-pay | Admitting: Internal Medicine

## 2017-01-23 DIAGNOSIS — R0781 Pleurodynia: Secondary | ICD-10-CM | POA: Insufficient documentation

## 2017-01-23 NOTE — Assessment & Plan Note (Signed)
Has been going on for 1 year. Likely musculoskeletal in nature, given that her pain is worse with sitting with poor posture and better with sitting with good posture. She is concerned about this because she has history of breast cancer. - Will order 2 view chest x-ray to rule out something unusual like a bony met - Can use Ibuprofen or Tylenol as needed

## 2017-01-23 NOTE — Assessment & Plan Note (Signed)
Last lipid panel 01/2016 with Chol 220, HDL 45, LDL 100, TG 374. Taking fish oil. - Continue fish oil - Recheck lipid panel

## 2017-01-23 NOTE — Telephone Encounter (Signed)
Called patient to discuss lab results. Left voicemail asking patient to call us back. Labs were normal except that her bad cholesterol was high. It was not high enough to start a medication at this time. We will continue to keep an eye on it.  Hyman Bible, MD PGY-3

## 2017-01-30 ENCOUNTER — Ambulatory Visit
Admission: RE | Admit: 2017-01-30 | Discharge: 2017-01-30 | Disposition: A | Discharge status: Home / Self Care | Payer: 59 | Source: Ambulatory Visit | Attending: Family Medicine | Admitting: Family Medicine

## 2017-01-30 DIAGNOSIS — R0781 Pleurodynia: Secondary | ICD-10-CM

## 2017-03-29 NOTE — Progress Notes (Signed)
   [facility] Phone: [phone]   Date of Visit: 04/01/2017   HPI:  Cervical Cancer Screening:  - historyof ASCUS on pap 03/2014 with negative HPV. Pap in 2012 was normal.  - will need pap with cotesting today  - denies vaginal bleeding, discharge, or itching. - declines STD screening   ROS: See HPI.  [place]:  PMH: HLD Hx of Depression  Breast Cancer  PHYSICAL EXAM: BP 132/80   Pulse (!) 56   Temp 98.1 F (36.7 C) (Oral)   Wt 166 lb (75.3 kg)   SpO2 97%   BMI 30.36 kg/m  GEN: NAD CV: RRR, no murmurs, rubs, or gallops PULM: CTAB, normal effort GU: Female genitalia: normal external genitalia, vulva, vagina, cervix but easily friable, uterus and adnexa NEURO: Awake, alert, no focal deficits grossly, normal speech   ASSESSMENT/PLAN:  Screening for cervical cancer History of ASCUS with negative HPV in 2016 but patient seemed to no be aware of this. Repeat pap with HPV today. Declines STI screening.  - Cytology - PAP([facility])  Smiley Houseman, MD PGY [facility]

## 2017-04-01 ENCOUNTER — Encounter: Payer: Self-pay | Admitting: Internal Medicine

## 2017-04-01 ENCOUNTER — Ambulatory Visit: Payer: 59 | Admitting: Internal Medicine

## 2017-04-01 ENCOUNTER — Other Ambulatory Visit: Payer: Self-pay

## 2017-04-01 ENCOUNTER — Other Ambulatory Visit (HOSPITAL_COMMUNITY)
Admission: RE | Admit: 2017-04-01 | Discharge: 2017-04-01 | Disposition: A | Discharge status: Home / Self Care | Payer: 59 | Source: Ambulatory Visit | Attending: Family Medicine | Admitting: Family Medicine

## 2017-04-01 VITALS — BP 132/80 | HR 56 | Temp 98.1°F | Wt 166.0 lb

## 2017-04-01 DIAGNOSIS — Z124 Encounter for screening for malignant neoplasm of cervix: Secondary | ICD-10-CM | POA: Insufficient documentation

## 2017-04-01 NOTE — Patient Instructions (Signed)
Thank you for coming. We will get in touch with you about your pap smear results

## 2017-04-03 ENCOUNTER — Encounter: Payer: Self-pay | Admitting: Internal Medicine

## 2017-04-03 LAB — CYTOLOGY - PAP
DIAGNOSIS: NEGATIVE
HPV: NOT DETECTED

## 2017-04-03 NOTE — Progress Notes (Signed)
Sent letter with normal pap results

## 2017-09-16 ENCOUNTER — Inpatient Hospital Stay: Payer: 59

## 2017-09-16 ENCOUNTER — Inpatient Hospital Stay: Payer: 59 | Attending: Hematology | Admitting: Hematology

## 2017-09-17 ENCOUNTER — Telehealth: Payer: Self-pay | Admitting: Hematology

## 2017-09-17 NOTE — Telephone Encounter (Signed)
Appointments scheduled LMVM for patient with new date/time per 8/12 sch msg

## 2017-09-18 ENCOUNTER — Telehealth: Payer: Self-pay | Admitting: Hematology

## 2017-09-18 NOTE — Telephone Encounter (Signed)
Tried to call regarding voicemail but call could not be completed at the time

## 2017-09-20 ENCOUNTER — Inpatient Hospital Stay: Payer: 59 | Admitting: Hematology

## 2017-09-20 ENCOUNTER — Inpatient Hospital Stay: Payer: 59

## 2017-10-21 ENCOUNTER — Other Ambulatory Visit: Payer: Self-pay | Admitting: Hematology

## 2017-10-21 DIAGNOSIS — Z853 Personal history of malignant neoplasm of breast: Secondary | ICD-10-CM

## 2017-11-08 NOTE — Progress Notes (Signed)
[place]  Telephone:([phone]) [phone] Fax:(336) [phone]  Clinic follow up Note   Patient Care Team: McDiarmid, Blane Ohara, MD as PCP - General (Family Medicine) Stark Klein, MD as Consulting Physician (General Surgery) Truitt Merle, MD as Consulting Physician (Hematology) Arloa Koh, MD as Consulting Physician (Radiation Oncology) Rockwell Germany, RN as Registered Nurse Mauro Kaufmann, RN as Registered Nurse Sylvan Cheese, NP as Nurse Practitioner (Nurse Practitioner) 11/11/2017  CHIEF COMPLAINTS:  Follow up left breast cancer  Oncology History   Breast cancer of upper-outer quadrant of left female breast [facility])   Staging form: Breast, AJCC 7th Edition     Clinical stage from 11/10/2014: Stage IIA (T2, N0, M0) - Signed by Truitt Merle, MD on 11/10/2014     Pathologic stage from 11/18/2014: Stage IB (T1c, N22m, cM0) - Signed by JEnid Cutter MD on 12/01/2014       Staging comments: Staged on lumpectomy specimen by Dr. MTresa Moore         [place]   10/28/2014 Mammogram    Screening and the diagnostic mammogram shows a 2.1 x 1.3 x 2.1 cm it irregular mass in the left breast 2:00 position, 2 cm from the nipple.    11/01/2014 Initial Biopsy    left breast biopsy showed invasive ductal carcinoma, grade 2, and DCIS     11/01/2014 Receptors her2    ER 100%, PR 100%, HER2/neu negative, KI67 60%     11/10/2014 Clinical Stage    Stage IIA: T2 N0    11/16/2014 Surgery    left breast lumpectomy with SLN biopsy.     11/16/2014 Pathology Results    left breast invasive ductal carcinoma, G3, 1 out of 3 SLN had micrometastasis, (-)LVI, margins negative,  (+) high grade DCIS    11/16/2014 Pathologic Stage    Stage IB: T1c N140m   11/16/2014 Oncotype testing    RS 30, predicts 20% 10-y risk of distant recurrence with tamoxifen alone     12/15/2014 - 02/17/2015 Adjuvant Chemotherapy    Docetaxel 75 mg/m, Cytoxan  600 mg/m, every 3 weeks,  total 4 cycles.    03/21/2015 - 05/04/2015 Radiation Therapy    adjuvant breast radiation: Left breast/ 45 Gy at 1.8 Gy per fraction x 25 fractions.  Left breast boost/ 16 Gy at 2 Gy per fraction x 8 fractions    05/21/2015 -  Anti-estrogen oral therapy    Letrozole 2.5 mg daily    06/30/2015 Survivorship    Survivorship visit completed    11/26/2016 Mammogram    IMPRESSION: Stable left breast lumpectomy site. No mammographic evidence of malignancy in the bilateral breasts.    HISTORY OF PRESENTING ILLNESS:  DeWanettaiffey [age].o. female is here because of her recently diagnosed left breast cancer. She is accompanied by her husband, daughter, mother to our multidisciplinary breast clinic today.  She felt a lump in her left breast one  Month ago, mild tender, she otherwise feels well no other new symptoms. This was developed by her primary care physician and screening mammogram was obtained. Mammogram reviewed a 2.1 cm mass in the left breast 2:00 position, core needle biopsy showed invasive ductal carcinoma, and DCIS.  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 She feels well overall. She does have mild low back pain, does not take any education. She is physically active at home, but does not exercise regularly. No family  history of breast cancer. She was on hormone replacement for the past 8 years, and just stopped progesterone a few weeks ago.  CURRENT THERAPY: Letrozole 2.'5mg'$  daily started in 05/2015   INTERIM HISTORY  Mrs Miao returns for follow up. She was last seen by me 1 year ago. Her mammogram on 2018 was benign and she is scheduled for a 2019 mammogram on 11/30/2017. She has been following up with family physicians in the interim. Today, she is here with her family members. She denies new symptoms and she is doing well. She is tolerating Letrozole well, and her joint pain has improved. She tries to exercise and stay active. She  complains of hot flashes that are bad, but tolerable.   She has gained weight since last year, and states that she tries to eat healthy and exercise, but her job requires long hours of sitting, and she doesn't have a lot of time off. 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  MEDICAL HISTORY:  Past Medical History:  Diagnosis Date  . Allergy   . Anxiety   . Arthritis   . Breast cancer ([place])   . Chemotherapy induced neutropenia ([place]) 12/29/2014  . Depression   . Ex-heavy cigarette smoker (20-39 per day) 09/27/2008   Smoked > 100 cigarettes in her life  Quit smoking 11/04/2012    . GERD (gastroesophageal reflux disease)   . Hot flashes   . Kidney stones   . Left breast mass 10/22/2014   Approx. 2 cm by 2 cm mobile hard mass of left breast at 3 oclock position   . Left wrist pain 04/28/2012  . Mucositis due to antineoplastic therapy 12/29/2014  . Mucositis due to chemotherapy 01/06/2015  . Personal history of chemotherapy    2016-2017  . Personal history of radiation therapy    2017  . Poor sleep pattern 11/24/2012  . Rash of hands 12/29/2014  . Sore throat 10/27/2012   10/31/12 [facility] ENT (see scanned document) - vocal cord polyp left, bilateral edema, significant postcricoid edema, c/w chronic tobacco use and chronic reflux. - Recheck 2 months - Encouraged tobacco cessation to reduce risk of laryngeal cancer     . Tubular adenoma of colon 03/2010  . Vocal cord nodule 03/08/2014  . Weight gain 01/05/2013  . Wrist pain     SURGICAL HISTORY: Past Surgical History:  Procedure Laterality Date  . BREAST LUMPECTOMY Left 11/16/2014  . BREAST LUMPECTOMY WITH AXILLARY LYMPH NODE BIOPSY Left 11/16/2014   Procedure: LEFT BREAST LUMPECTOMY WITH AXILLARY LYMPH NODE BIOPSY;  Surgeon: Stark Klein, MD;  Location: [facility];  Service: General;  Laterality: Left;  . COLONOSCOPY    . CYST REMOVAL WRIST Left   . tubal ligation  1995   GYN  HISTORY  Menarchal: 10 LMP: 48 Contraceptive: 16 HRT: 8 years, stopped in 10/2014  G2P2:  SOCIAL HISTORY: Social History   Socioeconomic History  . Marital status: Married    Spouse name: Not on file  . Number of children: 2  . Years of education: Not on file  . Highest education level: Not on file  Occupational History  . Occupation: truck Animator Needs  . Financial resource strain: Not on file  . Food insecurity:    Worry: Not on file    Inability: Not on file  . Transportation needs:    Medical: Not on file    Non-medical: Not on file  Tobacco  Use  . Smoking status: Former Smoker    Packs/day: 1.50    Years: 40.00    Pack years: 60.00    Types: Cigarettes    Last attempt to quit: 11/04/2012    Years since quitting: 5.0  . Smokeless tobacco: Never Used  Substance and Sexual Activity  . Alcohol use: Yes    Comment: social users   . Drug use: No  . Sexual activity: Yes    Partners: Male    Comment: Husband  Lifestyle  . Physical activity:    Days per week: Not on file    Minutes per session: Not on file  . Stress: Not on file  Relationships  . Social connections:    Talks on phone: Not on file    Gets together: Not on file    Attends religious service: Not on file    Active member of club or organization: Not on file    Attends meetings of clubs or organizations: Not on file    Relationship status: Not on file  . Intimate partner violence:    Fear of current or ex partner: Not on file    Emotionally abused: Not on file    Physically abused: Not on file    Forced sexual activity: Not on file  Other Topics Concern  . Not on file  Social History Narrative   Lives with husband in [place].   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 Supraventricular tachycardia Mother   .  Thyroid disease Mother 52       sluggish thyroid  . Throat cancer Father         smoker  . Stroke Father   . Alzheimer's disease Paternal Grandmother   . Ulcers Maternal Grandmother   . Colon cancer Neg Hx   . Esophageal cancer Neg Hx   . Pancreatic cancer Neg Hx   . Rectal cancer Neg Hx   . Stomach cancer Neg Hx     ALLERGIES:  is allergic to codeine.  MEDICATIONS:  Current Outpatient Medications  Medication Sig Dispense Refill  . aspirin 81 MG tablet Take 81 mg by mouth daily.    . Biotin w/ Vitamins C & E (HAIR/SKIN/NAILS PO) Take 2 tablets by mouth daily.    . calcium carbonate (ANTACID) 500 MG chewable tablet Chew 3 tablets by mouth daily.    . calcium carbonate (OS-CAL) 600 MG TABS tablet Take 600 mg by mouth daily with breakfast.    . diphenhydrAMINE (SOMINEX) 25 MG tablet Take 50 mg by mouth at bedtime as needed for sleep.     Marland Kitchen EVENING PRIMROSE OIL PO Take 1,300 mg by mouth daily.    . fish oil-omega-3 fatty acids 1000 MG capsule Take 2 g by mouth daily. Reported on 05/31/2015    . fluticasone (FLONASE) 50 MCG/ACT nasal spray Place 2 sprays into both nostrils daily. 16 g 0  . Inulin (PHILLIPS FIBER GOOD PO) Take by mouth.    . letrozole (FEMARA) 2.5 MG tablet TAKE 1 TABLET BY MOUTH EVERY DAY 90 tablet 2  . Multiple Vitamin (MULTIVITAMIN) tablet Take 1 tablet by mouth daily.     No current facility-administered medications for this visit.     REVIEW OF SYSTEMS:  Constitutional: Denies fevers, chills or abnormal night sweats (+) hot flashes (+) weight gain Eyes: Denies blurriness of vision, double vision or watery eyes Ears, nose, mouth, throat, and face: Denies mucositis or sore throat Respiratory: Denies cough, dyspnea or wheezes Cardiovascular: Denies palpitation, chest discomfort or lower extremity swelling Gastrointestinal:  Denies nausea, heartburn or change in bowel habits Skin: Denies abnormal skin rashes Lymphatics: Denies new lymphadenopathy or easy  bruising Neurological:Denies numbness, tingling or new weaknesses Behavioral/Psych: Mood is stable, no new changes  Musc: (+) joint pain (hands), improving All other systems were reviewed with the patient and are negative.  PHYSICAL EXAMINATION: ECOG PERFORMANCE STATUS: 1  Vitals:   11/11/17 0822  BP: (!) 155/80  Pulse: 60  Resp: 18  Temp: (!) 97.5 F (36.4 C)  SpO2: 99%   Filed Weights   11/11/17 0822  Weight: 176 lb 6.4 oz (80 kg)     GENERAL:alert, no distress and comfortable SKIN: skin color, texture, turgor are normal, there are a few scattered small skin rash on her scalp EYES: normal, conjunctiva are pink and non-injected, sclera clear OROPHARYNX:no exudate, no erythema and lips, buccal mucosa, and tongue normal  NECK: supple, thyroid normal size, non-tender, without nodularity LYMPH:  no palpable lymphadenopathy in the cervical, axillary or inguinal LUNGS: clear to auscultation and percussion with normal breathing effort HEART: regular rate & rhythm and no murmurs and no lower extremity edema ABDOMEN:abdomen soft, non-tender and normal bowel sounds Musculoskeletal: no cyanosis of digits and no clubbing  PSYCH: alert & oriented x 3 with fluent speech NEURO: no focal motor/sensory deficits Breasts: Breast inspection showed them to be symmetrical with no nipple discharge. The left breast incision has healed well (+) There is a 1 x 1.5 cm mass underneath the surgical incision on left lateral breast, likely surgical seroma. Palpation of the right breast and bilateral axilla showed no palpable mass or adenopathy.  LABORATORY DATA:  I have reviewed the data as listed CBC Latest Ref Rng & Units 11/11/2017 09/17/2016 05/14/2016  WBC 3.9 - 10.3 K/uL 6.7 5.8 5.8  Hemoglobin 11.6 - 15.9 g/dL 13.2 13.5 13.7  Hematocrit 34.8 - 46.6 % 38.8 39.5 39.8  Platelets 145 - 400 K/uL 263 279 277   No results for input(s): NA, K, CL, CO2, GLUCOSE, BUN, CREATININE, CALCIUM, GFRNONAA, GFRAA,  PROT, ALBUMIN, AST, ALT, ALKPHOS, BILITOT, BILIDIR, IBILI in the last 8760 hours.  Pathology report Diagnosis 1. Breast, lumpectomy, Left - INVASIVE DUCTAL CARCINOMA, GRADE 3, SPANNING 2 CM. - DUCTAL CARCINOMA IN SITU, HIGH GRADE. - RESECTION MARGINS ARE NEGATIVE FOR INVASIVE AND IN SITU CARCINOMA. - LOBULAR NEOPLASIA (ATYPICAL LOBULAR HYPERPLASIA). - SEE ONCOLOGY TABLE. 2. Lymph node, sentinel, biopsy, Left axillary #1 - ONE OF ONE LYMPH NODES NEGATIVE FOR CARCINOMA (0/1). 3. Lymph node, sentinel, biopsy, Left axillary #2 - MICROMETASTASIS IN ONE OF ONE LYMPH NODE (1/1). 4. Lymph node, sentinel, biopsy, Left axillary #3 - ONE OF ONE LYMPH NODES NEGATIVE FOR CARCINOMA (0/1).  Microscopic Comment 1. BREAST, INVASIVE TUMOR, WITH LYMPH NODES PRESENT Specimen, including laterality and lymph node sampling (sentinel, non-sentinel): Left breast lump and left axillary sentinel lymph nodes. Procedure: Left breast lumpectomy and left axillary sentinel lymph node biopsies. Histologic type: Invasive ductal carcinoma. Grade: 3 Tubule formation: 3 Nuclear pleomorphism: 3 Mitotic: 2 Tumor size (gross measurement): 2 cm Margins: Invasive, distance to closest margin: 0.2 cm (inferior). In-situ, distance to closest margin: 0.4 cm (superior). If margin positive, focally or broadly: N/A. Lymphovascular invasion: Definitive invasion not  identified. Ductal carcinoma in situ: Present. Grade: III Extensive intraductal component: No. Lobular neoplasia: Present (atypical lobular hyperplasia). Tumor focality: Unifocal. Treatment effect: N/A. Extent of tumor: Confined to breast parenchyma. Lymph nodes: Examined: 3 Sentinel 0 Non-sentinel 3 Total Lymph nodes with metastasis: 1 Isolated tumor cells (< 0.2 mm): 0 Micrometastasis: (> 0.2 mm and < 2.0 mm): 1 Macrometastasis: (> 2.0 mm): 0 Extracapsular extension: No. Breast prognostic profile: Performed on biopsy ([id number]). Her2 will be repeated  on current specimen. Estrogen receptor: Positive, strong staining intensity. Progesterone receptor: Positive, strong staining intensity. Her 2 neu: Negative. Ki-67: 60%. Non-neoplastic breast: Fibrocystic change. TNM: pT1c, pN47m Results: HER2 - NEGATIVE RATIO OF HER2/CEP17 SIGNALS 1.15 AVERAGE HER2 COPY NUMBER PER CELL 3.15  Oncotype Dx RS: 30, predicts 20% 10-y risk of distant recurrence with tamoxifen alone   RADIOGRAPHIC STUDIES: I have personally reviewed the radiological images as listed and agreed with the findings in the report.  11/26/2016 Mammogram IMPRESSION: Stable left breast lumpectomy site. No mammographic evidence of malignancy in the bilateral breasts.  Diagnostic Mammogram 11/07/2015 IMPRESSION: No evidence of malignancy in either breast.  DEXA Scan 11/07/2015 The BMD measured at Femur Neck Right is 0.868 g/cm2 with a T-score of -1.2. This patient is considered osteopenic according to [place] criteria. Site Region Measured Date Measured Age YA BMD Significant CHANGE T-score DualFemur Neck Right 11/07/2015    55.0         -1.2    0.868 g/cm2 AP Spine  L1-L4      11/07/2015    55.0         0.0     1.196 g/cm2  ASSESSMENT & PLAN:  57y.o. Caucasian female, postmenopausal, presented with palpable left breast mass  1. Left breast invasive ductal carcinoma, pT1cN143m0, stage IB, grade 3, ER positive, PR positive, HER-2 negative, (+) DCIS -I previously reviewed her surgical pathology results in great details with patient and her husband. -She is now status post lumpectomy and sentinel lymph node biopsy. Surgical margins were negative. She has early-stage breast cancer, likely cured by complete surgical resection -I previously discussed the risk of cancer recurrence after her surgery. The Oncotype DX test results were explained to her in details. Her recurrence score is 30, which predicts 20% 10 year risk of distant recurrence with tamoxifen  alone. She has received adjuvant chemotherapy with docetaxel and Cytoxan. -She is on adjuvant letrozole, tolerating well, has mild-to-moderate hot flash, and mild arthralgia. Overall side effects are manageable so far. She'll continue letrozole, plan for a total 5-7 years. -She will continue breast cancer surveillance. She is due for mammogram in 11/2017 -Lab results reviewed with her, her CBC  Is WNLs and CMP is pending. Her physical exam was unremarkable. She is clinically doing well, so for recurrence. -she will f/u with Dr. ByBarry Dienesnce a year, next appointment in 6 months   -I advised her to exercise and take breaks to walk to reduce risk of blood clots. I also advised her to continue eating healthy and exercising as possible. -f/u in 1 year.   2. Anxiety and depression -stable. She takes xanax as needed.   3. GERD -She is on omeprazole, and takes Tums sometime   4. Hot flash -Slightly worse after she started letrozole. So far manageable.  5. Arthralgia and bilateral rib cage pain -In her hands, tolerable -Lower b/l ribs. I have encouraged her to wear bras without an under wire, like sports bras. I also recommended a heating  pad and tylenol or ibuprofen.  -It has much improved now   6. Osteopenia -Her bone density scan from October 2017 showed osteopenia, no high risk for fracture. -She will continue calcium and vitamin D -She is due for Bone density in 2019  7. Weight gain -She is a Administrator, sedentary, part of her weight gain is probably also related to letrozole -I encouraged her to eat healthy, cut carbohydrate intake, and exercise more.  She agrees.  Plan -Mammogram and DEXA soon in 11/2017 -f/u in 1 year, she will see Dr. Barry Dienes in 6 months  -Refilled Letrozole   All questions were answered. The patient knows to call the clinic with any problems, questions or concerns.  I spent 20 minutes counseling the patient face to face. The total time spent in the  appointment was 25 minutes and more than 50% was on counseling.  Dierdre Searles Dweik am acting as scribe for Dr. Truitt Merle.  I have reviewed the above documentation for accuracy and completeness, and I agree with the above.    Truitt Merle, MD 11/11/2017

## 2017-11-11 ENCOUNTER — Other Ambulatory Visit: Payer: Self-pay | Admitting: Hematology

## 2017-11-11 ENCOUNTER — Inpatient Hospital Stay: Payer: 59 | Admitting: Hematology

## 2017-11-11 ENCOUNTER — Encounter: Payer: Self-pay | Admitting: Hematology

## 2017-11-11 ENCOUNTER — Telehealth: Payer: Self-pay | Admitting: Hematology

## 2017-11-11 ENCOUNTER — Inpatient Hospital Stay: Payer: 59 | Attending: Hematology

## 2017-11-11 VITALS — BP 155/80 | HR 60 | Temp 97.5°F | Resp 18 | Ht 62.0 in | Wt 176.4 lb

## 2017-11-11 DIAGNOSIS — M8580 Other specified disorders of bone density and structure, unspecified site: Secondary | ICD-10-CM | POA: Diagnosis not present

## 2017-11-11 DIAGNOSIS — Z23 Encounter for immunization: Secondary | ICD-10-CM

## 2017-11-11 DIAGNOSIS — M1990 Unspecified osteoarthritis, unspecified site: Secondary | ICD-10-CM | POA: Insufficient documentation

## 2017-11-11 DIAGNOSIS — Z923 Personal history of irradiation: Secondary | ICD-10-CM | POA: Diagnosis not present

## 2017-11-11 DIAGNOSIS — Z170 Estrogen receptor positive status [ER+]: Secondary | ICD-10-CM | POA: Insufficient documentation

## 2017-11-11 DIAGNOSIS — F418 Other specified anxiety disorders: Secondary | ICD-10-CM | POA: Insufficient documentation

## 2017-11-11 DIAGNOSIS — C50412 Malignant neoplasm of upper-outer quadrant of left female breast: Secondary | ICD-10-CM

## 2017-11-11 DIAGNOSIS — K219 Gastro-esophageal reflux disease without esophagitis: Secondary | ICD-10-CM | POA: Diagnosis not present

## 2017-11-11 DIAGNOSIS — Z79811 Long term (current) use of aromatase inhibitors: Secondary | ICD-10-CM | POA: Insufficient documentation

## 2017-11-11 DIAGNOSIS — Z9221 Personal history of antineoplastic chemotherapy: Secondary | ICD-10-CM | POA: Insufficient documentation

## 2017-11-11 DIAGNOSIS — C50912 Malignant neoplasm of unspecified site of left female breast: Secondary | ICD-10-CM

## 2017-11-11 DIAGNOSIS — E2839 Other primary ovarian failure: Secondary | ICD-10-CM

## 2017-11-11 LAB — CMP (CANCER CENTER ONLY)
ALBUMIN: 4.1 g/dL (ref 3.5–5.0)
ALT: 20 U/L (ref 0–44)
AST: 22 U/L (ref 15–41)
Alkaline Phosphatase: 99 U/L (ref 38–126)
Anion gap: 10 (ref 5–15)
BILIRUBIN TOTAL: 0.5 mg/dL (ref 0.3–1.2)
BUN: 14 mg/dL (ref 6–20)
CO2: 28 mmol/L (ref 22–32)
Calcium: 9.9 mg/dL (ref 8.9–10.3)
Chloride: 101 mmol/L (ref 98–111)
Creatinine: 0.82 mg/dL (ref 0.44–1.00)
GFR, Est AFR Am: >60 mL/min (ref >60)
GFR, Estimated: >60 mL/min (ref >60)
Glucose, Bld: 96 mg/dL (ref 70–99)
POTASSIUM: 4.2 mmol/L (ref 3.5–5.1)
Sodium: 139 mmol/L (ref 135–145)
TOTAL PROTEIN: 7.2 g/dL (ref 6.5–8.1)

## 2017-11-11 LAB — CBC WITH DIFFERENTIAL/PLATELET
BASOS ABS: 0.1 10*3/uL (ref 0.0–0.1)
BASOS PCT: 1 %
Eosinophils Absolute: 0.2 10*3/uL (ref 0.0–0.5)
Eosinophils Relative: 3 %
HEMATOCRIT: 38.8 % (ref 34.8–46.6)
Hemoglobin: 13.2 g/dL (ref 11.6–15.9)
Lymphocytes Relative: 23 %
Lymphs Abs: 1.6 10*3/uL (ref 0.9–3.3)
MCH: 30.6 pg (ref 25.1–34.0)
MCHC: 34.1 g/dL (ref 31.5–36.0)
MCV: 89.6 fL (ref 79.5–101.0)
MONO ABS: 0.5 10*3/uL (ref 0.1–0.9)
Monocytes Relative: 8 %
NEUTROS ABS: 4.4 10*3/uL (ref 1.5–6.5)
NEUTROS PCT: 65 %
Platelets: 263 10*3/uL (ref 145–400)
RBC: 4.33 MIL/uL (ref 3.70–5.45)
RDW: 13.5 % (ref 11.2–14.5)
WBC: 6.7 10*3/uL (ref 3.9–10.3)

## 2017-11-11 MED ORDER — INFLUENZA VAC SPLIT QUAD 0.5 ML IM SUSY
0.5000 mL | PREFILLED_SYRINGE | Freq: Once | INTRAMUSCULAR | Status: AC
  Administered 2017-11-11: 0.5 mL via INTRAMUSCULAR

## 2017-11-11 MED ORDER — INFLUENZA VAC SPLIT QUAD 0.5 ML IM SUSY
PREFILLED_SYRINGE | INTRAMUSCULAR | Status: AC
Start: 2017-11-11 — End: 2017-11-11
  Filled 2017-11-11: qty 0.5

## 2017-11-11 NOTE — Telephone Encounter (Signed)
Appts scheduled avs/calendar printed per 10/7 los °

## 2017-12-02 ENCOUNTER — Ambulatory Visit
Admission: RE | Admit: 2017-12-02 | Discharge: 2017-12-02 | Disposition: A | Discharge status: Home / Self Care | Payer: 59 | Source: Ambulatory Visit | Attending: Hematology | Admitting: Hematology

## 2017-12-02 DIAGNOSIS — Z853 Personal history of malignant neoplasm of breast: Secondary | ICD-10-CM

## 2018-01-05 ENCOUNTER — Encounter: Payer: Self-pay | Admitting: Hematology

## 2018-01-13 ENCOUNTER — Ambulatory Visit
Admission: RE | Admit: 2018-01-13 | Discharge: 2018-01-13 | Disposition: A | Discharge status: Home / Self Care | Payer: 59 | Source: Ambulatory Visit | Attending: Hematology | Admitting: Hematology

## 2018-01-13 DIAGNOSIS — E2839 Other primary ovarian failure: Secondary | ICD-10-CM

## 2018-07-16 ENCOUNTER — Other Ambulatory Visit: Payer: Self-pay | Admitting: Hematology

## 2018-07-16 DIAGNOSIS — C50912 Malignant neoplasm of unspecified site of left female breast: Secondary | ICD-10-CM

## 2018-08-26 ENCOUNTER — Encounter: Payer: Self-pay | Admitting: Podiatry

## 2018-08-26 ENCOUNTER — Other Ambulatory Visit: Payer: Self-pay

## 2018-08-26 ENCOUNTER — Ambulatory Visit: Payer: BC Managed Care – PPO | Admitting: Podiatry

## 2018-08-26 VITALS — BP 130/74 | HR 58 | Temp 98.0°F | Resp 16

## 2018-08-26 DIAGNOSIS — L600 Ingrowing nail: Secondary | ICD-10-CM | POA: Diagnosis not present

## 2018-08-26 DIAGNOSIS — M79675 Pain in left toe(s): Secondary | ICD-10-CM | POA: Diagnosis not present

## 2018-08-26 DIAGNOSIS — M79674 Pain in right toe(s): Secondary | ICD-10-CM

## 2018-08-26 MED ORDER — CEPHALEXIN 500 MG PO CAPS: 500.0000 mg | ORAL_CAPSULE | Freq: Three times a day (TID) | ORAL | 0 refills | Status: DC

## 2018-08-26 NOTE — Patient Instructions (Signed)

## 2018-08-26 NOTE — Progress Notes (Signed)
Subjective:   Patient ID: Shirley Howard, female   DOB: 58 y.o.   MRN: [id number]   HPI 58 year old female presents the office today for concerns of ingrown toenails both of her big toes of the left side worse than the right.  She states that left side, pointing lateral aspect is been with 2 weeks she is noticing localized redness and swelling.  She does state that open heel cords on both great toes for become painful.  She does have a history of having the nail corners removed previously several years ago.  She does try to trim the nails herself.  She gets occasional pedicures.  No other treatment or concerns today.   Review of Systems  All other systems reviewed and are negative.  Past Medical History:  Diagnosis Date  . Allergy   . Anxiety   . Arthritis   . Breast cancer ([place])   . Chemotherapy induced neutropenia ([place]) 12/29/2014  . Depression   . Ex-heavy cigarette smoker (20-39 per day) 09/27/2008   Smoked > 100 cigarettes in her life  Quit smoking 11/04/2012    . GERD (gastroesophageal reflux disease)   . Hot flashes   . Kidney stones   . Left breast mass 10/22/2014   Approx. 2 cm by 2 cm mobile hard mass of left breast at 3 oclock position   . Left wrist pain 04/28/2012  . Mucositis due to antineoplastic therapy 12/29/2014  . Mucositis due to chemotherapy 01/06/2015  . Personal history of chemotherapy    2016-2017  . Personal history of radiation therapy    2017  . Poor sleep pattern 11/24/2012  . Rash of hands 12/29/2014  . Sore throat 10/27/2012   10/31/12 [facility] ENT (see scanned document) - vocal cord polyp left, bilateral edema, significant postcricoid edema, c/w chronic tobacco use and chronic reflux. - Recheck 2 months - Encouraged tobacco cessation to reduce risk of laryngeal cancer     . Tubular adenoma of colon 03/2010  . Vocal cord nodule 03/08/2014  . Weight gain 01/05/2013  . Wrist pain     Past Surgical History:  Procedure Laterality Date  . BREAST LUMPECTOMY  Left 11/16/2014  . BREAST LUMPECTOMY WITH AXILLARY LYMPH NODE BIOPSY Left 11/16/2014   Procedure: LEFT BREAST LUMPECTOMY WITH AXILLARY LYMPH NODE BIOPSY;  Surgeon: Stark Klein, MD;  Location: [facility];  Service: General;  Laterality: Left;  . COLONOSCOPY    . CYST REMOVAL WRIST Left   . tubal ligation  1995     Current Outpatient Medications:  .  aspirin 81 MG tablet, Take 81 mg by mouth daily., Disp: , Rfl:  .  Biotin w/ Vitamins C & E (HAIR/SKIN/NAILS PO), Take 2 tablets by mouth daily., Disp: , Rfl:  .  calcium carbonate (ANTACID) 500 MG chewable tablet, Chew 3 tablets by mouth daily., Disp: , Rfl:  .  calcium carbonate (OS-CAL) 600 MG TABS tablet, Take 600 mg by mouth daily with breakfast., Disp: , Rfl:  .  diphenhydrAMINE (SOMINEX) 25 MG tablet, Take 50 mg by mouth at bedtime as needed for sleep. , Disp: , Rfl:  .  EVENING PRIMROSE OIL PO, Take 1,300 mg by mouth daily., Disp: , Rfl:  .  fish oil-omega-3 fatty acids 1000 MG capsule, Take 2 g by mouth daily. Reported on 05/31/2015, Disp: , Rfl:  .  fluticasone (FLONASE) 50 MCG/ACT nasal spray, Place 2 sprays into both nostrils daily., Disp: 16 g, Rfl: 0 .  Inulin (PHILLIPS FIBER  GOOD PO), Take by mouth., Disp: , Rfl:  .  letrozole (FEMARA) 2.5 MG tablet, TAKE 1 TABLET BY MOUTH EVERY DAY, Disp: 90 tablet, Rfl: 2 .  Multiple Vitamin (MULTIVITAMIN) tablet, Take 1 tablet by mouth daily., Disp: , Rfl:   Allergies  Allergen Reactions  . Codeine     REACTION: nausea and vomiting.  Per pt "it makes me pass out". maw         Objective:  Physical Exam  General: AAO x3, NAD  Dermatological: Incurvation present both medial lateral aspects of bilateral hallux toenails with tenderness palpation.  The left lateral nail does have some localized edema erythema mostly to the distal aspect the nail but there is no drainage or pus identified today and there is no ascending cellulitis.  No open lesions.  Nails in general to  bilateral hallux are mildly hypertrophic with discoloration and yellow discoloration.  Vascular: Dorsalis Pedis artery and Posterior Tibial artery pedal pulses are 2/4 bilateral with immedate capillary fill time. . There is no pain with calf compression, swelling, warmth, erythema.   Neruologic: Grossly intact via light touch bilateral  Musculoskeletal: No gross boney pedal deformities bilateral. No pain, crepitus, or limitation noted with foot and ankle range of motion bilateral. Muscular strength 5/5 in all groups tested bilateral.  Gait: Unassisted, Nonantalgic.       Assessment:   Ingrown toenails bilateral hallux     Plan:  -Treatment options discussed including all alternatives, risks, and complications -Etiology of symptoms were discussed -At this time, the patient is requesting partial nail removal with chemical matricectomy to the symptomatic portion of the nail. Risks and complications were discussed with the patient for which they understand and written consent was obtained. Under sterile conditions a total of 3 mL of a mixture of 2% lidocaine plain and 0.5% Marcaine plain was infiltrated in a hallux block fashion. Once anesthetized, the skin was prepped in sterile fashion. A tourniquet was then applied. Next the medial/lateral aspect of hallux nail border was then sharply excised making sure to remove the entire offending nail border. Once the nails were ensured to be removed area was debrided and the underlying skin was intact. There is no purulence identified in the procedure. Next phenol was then applied under standard conditions and copiously irrigated. Silvadene was applied. A dry sterile dressing was applied. After application of the dressing the tourniquet was removed and there is found to be an immediate capillary refill time to the digit. The patient tolerated the procedure well any complications. Post procedure instructions were discussed the patient for which he verbally  understood. Follow-up in one week for nail check or sooner if any problems are to arise. Discussed signs/symptoms of infection and directed to call the office immediately should any occur or go directly to the emergency room. In the meantime, encouraged to call the office with any questions, concerns, changes symptoms. -[place]ef[place]  Trula Slade DPM

## 2018-09-01 ENCOUNTER — Encounter: Payer: Self-pay | Admitting: Family Medicine

## 2018-09-01 ENCOUNTER — Other Ambulatory Visit: Payer: Self-pay

## 2018-09-01 ENCOUNTER — Ambulatory Visit (INDEPENDENT_AMBULATORY_CARE_PROVIDER_SITE_OTHER): Payer: BLUE CROSS/BLUE SHIELD | Admitting: Family Medicine

## 2018-09-01 VITALS — BP 122/72 | HR 58 | Ht 62.0 in | Wt 176.0 lb

## 2018-09-01 DIAGNOSIS — L600 Ingrowing nail: Secondary | ICD-10-CM

## 2018-09-01 DIAGNOSIS — Z122 Encounter for screening for malignant neoplasm of respiratory organs: Secondary | ICD-10-CM

## 2018-09-01 DIAGNOSIS — Z01419 Encounter for gynecological examination (general) (routine) without abnormal findings: Secondary | ICD-10-CM | POA: Diagnosis not present

## 2018-09-01 HISTORY — DX: Ingrowing nail: L60.0

## 2018-09-01 MED ORDER — ZOSTER VAC RECOMB ADJUVANTED 50 MCG/0.5ML IM SUSR: 0.5000 mL | Freq: Once | INTRAMUSCULAR | 0 refills | Status: AC

## 2018-09-01 NOTE — Patient Instructions (Signed)
It was great to see you!  Our plans for today:  - Take the Shingles vaccine prescription to the pharmacy. You'll get the second dose between 2 and 6 months after. - It is recommended to screen for lung cancer using a low dose CT scan.  Take care and seek immediate care sooner if you develop any concerns.   Dr. [facility]  Things to do to keep yourself healthy  - Exercise at least 30-45 minutes a day, 3-4 days a week.  - Eat a low-fat diet with lots of fruits and vegetables, up to 7-9 servings per day.  - Seatbelts can save your life. Wear them always.  - Smoke detectors on every level of your home, check batteries every year.  - Eye Doctor - have an eye exam every 1-2 years  - Safe sex - if you may be exposed to STDs, use a condom.  - Alcohol -  If you drink, do it moderately, less than 2 drinks per day.  - [place]. Choose someone to speak for you if you are not able.  - Depression is common in our stressful world.If you're feeling down or losing interest in things you normally enjoy, please come in for a visit.  - Violence - If anyone is threatening or hurting you, please call immediately.

## 2018-09-01 NOTE — Progress Notes (Signed)
Subjective:   Patient ID: Shirley Howard    DOB: Jan 05, 1961, 58 y.o. female   MRN: [id number]  Aime Carreras is a 58 y.o. female with a history of HLD, h/o breast cancer here for well woman/preventative visit.  Acute Concerns: none - h/o breast cancer - last mammogram without malignancy. Follows with oncology, next visit 09/2018. - Ingrown nail -bilateral great toes.  Follows with [facility], follows up tomorrow.  Continues on Keflex without issues.   Diet: eats lots of fruits and vegetables.   Exercise: walks a lot, stays active  Employment: truck driver  Sexual/Birth History: G2P2002, currently sexually active with one female partner  Birth Control: postmenopausal  POA/Living Will: yes  Review of Systems: Per HPI.    PMH, PSH, Medications, Allergies, and FmHx reviewed and updated in EMR.  Social History: former smoker. Quit 6 years ago. 1.5ppd 40 years. +FH dad throat cancer.  Immunization:  Tdap/TD: UTD  Influenza: UTD  Pneumococcal: n/a  Shingrix: due  Cancer Screening:  Pap Smear: UTD, negative. Due 2022  Mammogram: h/o breast cancer - last mammogram 11/2017 w/o malignancy.  Colonoscopy: done 2017, due 2022  Dexa: UTD, 01/2018  Review of Systems:  Per HPI.  [place], medications and smoking status reviewed.  Objective:   BP 122/72   Pulse (!) 58   Ht 5\' 2"  (1.575 m)   Wt 176 lb (79.8 kg)   SpO2 98%   BMI 32.19 kg/m  Vitals and nursing note reviewed.  General: Overweight, in no acute distress with non-toxic appearance HEENT: normocephalic, atraumatic, moist mucous membranes.  Good dentition Neck: supple, non-tender without lymphadenopathy CV: regular rate and rhythm without murmurs, rubs, or gallops, no lower extremity edema Lungs: clear to auscultation bilaterally with normal work of breathing Abdomen: soft, non-tender, non-distended, normoactive bowel sounds Skin: warm, dry, no rashes or lesions Extremities: warm and well perfused, normal tone  MSK: ROM grossly intact, strength intact, gait normal Neuro: Alert and oriented, speech normal  Assessment & Plan:   No problem-specific Assessment & Plan notes found for this encounter.  Well Woman Exam Doing well, no concerns today. Medications and problem list reviewed and updated. Has lab appointment with cancer center next month. Given extensive smoking history with greater than 30-pack-year history and quit date less than 15 years ago, recommend low-dose CT scan for screening for lung cancer.  Up to date on other age-appropriate cancer screenings.  Follows regularly with oncology for history of breast cancer.  Prescription provided for Shingrix vaccine.  Counseling provided regarding diet and exercise as a part of a healthy lifestyle.  Orders Placed This Encounter  Procedures  . CT CHEST LUNG CA SCREEN LOW DOSE W/O CM    Standing Status:   Future    Standing Expiration Date:   11/02/2019    Order Specific Question:   Reason for Exam (SYMPTOM  OR DIAGNOSIS REQUIRED)    Answer:   screening for lung cancer, min 30 pack year hx    Order Specific Question:   Preferred Imaging Location?    Answer:   [id number]    Order Specific Question:   Is patient pregnant?    Answer:   No    Order Specific Question:   Radiology Contrast Protocol - do NOT remove file path    Answer:   [email or web address]   Meds ordered this encounter  Medications  . Zoster Vaccine Adjuvanted [facility]) injection    Sig: Inject 0.5 mLs into the muscle once for  1 dose. Ok to give second dose at appropriate interval.    Dispense:  0.5 mL    Refill:  0    Rory Percy, DO PGY-3, [facility] 09/01/2018 9:54 AM

## 2018-09-02 ENCOUNTER — Ambulatory Visit: Payer: BC Managed Care – PPO

## 2018-09-02 DIAGNOSIS — L600 Ingrowing nail: Secondary | ICD-10-CM

## 2018-09-02 NOTE — Patient Instructions (Signed)

## 2018-09-18 ENCOUNTER — Telehealth: Payer: Self-pay | Admitting: Hematology

## 2018-09-18 NOTE — Telephone Encounter (Signed)
Returned patient's phone call regarding rescheduling 10/05 appointment, per patient's request appointment has been rescheduled for 10/19.  Message to provider.

## 2018-10-10 NOTE — Progress Notes (Signed)
Patient is here today for follow-up appointment, recent procedure performed on 08/26/2018, removal of ingrown toenails both big toes.  She states that she feels like everything is healing well and she continues to soak, and is not having any increased pain at this time.  No redness, no erythema, no swelling, no drainage, no other signs and symptoms of infection.  The area is healed well and scabbing over at this time.  Discussed signs and symptoms of infection, verbal and written instructions were given to the patient.  She is to follow-up as needed with any acute symptom changes.

## 2018-11-10 ENCOUNTER — Ambulatory Visit: Payer: BLUE CROSS/BLUE SHIELD | Admitting: Hematology

## 2018-11-10 ENCOUNTER — Other Ambulatory Visit: Payer: 59

## 2018-11-14 ENCOUNTER — Other Ambulatory Visit: Payer: Self-pay | Admitting: Hematology

## 2018-11-14 DIAGNOSIS — Z9889 Other specified postprocedural states: Secondary | ICD-10-CM

## 2018-11-19 NOTE — Progress Notes (Signed)
[facility]   [phone] Fax:([phone]) [phone]   Clinic Follow up Note   Patient Care Team: McDiarmid, Blane Ohara, MD as PCP - General (Family Medicine) Stark Klein, MD as Consulting Physician (General Surgery) Truitt Merle, MD as Consulting Physician (Hematology) Arloa Koh, MD (Inactive) as Consulting Physician (Radiation Oncology) Rockwell Germany, RN as Registered Nurse Mauro Kaufmann, RN as Registered Nurse Jake Shark, Johny Blamer, NP as Nurse Practitioner (Nurse Practitioner)  Date of Service:  11/24/2018  CHIEF COMPLAINT: Follow up left breast cancer  SUMMARY OF ONCOLOGIC HISTORY: Oncology History Overview Note  Breast cancer of upper-outer quadrant of left female breast [facility])   Staging form: Breast, AJCC 7th Edition     Clinical stage from 11/10/2014: Stage IIA (T2, N0, M0) - Signed by Truitt Merle, MD on 11/10/2014     Pathologic stage from 11/18/2014: Stage IB (T1c, N31m, cM0) - Signed by JEnid Cutter MD on 12/01/2014       Staging comments: Staged on lumpectomy specimen by Dr. MTresa Moore       Breast cancer of upper-outer quadrant of left female breast ([facility]  10/28/2014 Mammogram   Screening and the diagnostic mammogram shows a 2.1 x 1.3 x 2.1 cm it irregular mass in the left breast 2:00 position, 2 cm from the nipple.   11/01/2014 Initial Biopsy   left breast biopsy showed invasive ductal carcinoma, grade 2, and DCIS    11/01/2014 Receptors her2   ER 100%, PR 100%, HER2/neu negative, KI67 60%    11/10/2014 Clinical Stage   Stage IIA: T2 N0   11/16/2014 Surgery   left breast lumpectomy with SLN biopsy.    11/16/2014 Pathology Results   left breast invasive ductal carcinoma, G3, 1 out of 3 SLN had micrometastasis, (-)LVI, margins negative,  (+) high grade DCIS   11/16/2014 Pathologic Stage   Stage IB: T1c N179m  11/16/2014 Oncotype testing   RS 30, predicts 20% 10-y risk of distant recurrence with tamoxifen alone    12/15/2014 - 02/17/2015  Adjuvant Chemotherapy   Docetaxel 75 mg/m, Cytoxan 600 mg/m, every 3 weeks,  total 4 cycles.   03/21/2015 - 05/04/2015 Radiation Therapy   adjuvant breast radiation: Left breast/ 45 Gy at 1.8 Gy per fraction x 25 fractions.  Left breast boost/ 16 Gy at 2 Gy per fraction x 8 fractions   05/21/2015 -  Anti-estrogen oral therapy   Letrozole 2.5 mg daily   06/30/2015 Survivorship   Survivorship visit completed   11/26/2016 Mammogram   IMPRESSION: Stable left breast lumpectomy site. No mammographic evidence of malignancy in the bilateral breasts.      CURRENT THERAPY:  Letrozole 2.'5mg'$  daily started in 05/2015   INTERVAL HISTORY:  Shirley Howard here for a follow up left breast cancer. She was last seen by me 1 year ago. She presents to the clinic alone.  She is clinically doing very well, has mild to moderate hot flashes, tolerable.  Arthralgia has improved over time, with overall mild and tolerable.  Her weight is stable, no other complaints.  She denies any new pain, abdominal discomfort, dyspnea, or other symptoms.  Appetite and energy level are normal.  The review of systems otherwise negative.  MEDICAL HISTORY:  Past Medical History:  Diagnosis Date  . Allergy   . Anxiety   . Arthritis   . [place] ([facility] [place]  . Chemotherapy [place] neutropenia ([facility]  . Depression   . Ex-heavy cigarette smoker (20-39 per day)  09/27/2008   Smoked > 100 cigarettes in her life  Quit smoking 11/04/2012    . GERD (gastroesophageal reflux disease)   . Hot flashes   . Ingrown nail 09/01/2018  . Kidney stones   . Left breast mass 10/22/2014   Approx. 2 cm by 2 cm mobile hard mass of left breast at 3 oclock position   . Left wrist pain 04/28/2012  . Mucositis due to antineoplastic therapy 12/29/2014  . Mucositis due to chemotherapy 01/06/2015  . Personal history of chemotherapy    2016-2017  . Personal history of radiation therapy    2017  . Poor sleep pattern 11/24/2012  . Rash of  hands 12/29/2014  . Sore throat 10/27/2012   10/31/12 [facility] (see scanned document) - vocal cord polyp left, bilateral edema, significant postcricoid edema, c/w chronic tobacco use and chronic reflux. - Recheck 2 months - Encouraged tobacco cessation to reduce risk of laryngeal cancer     . Tubular adenoma of colon 03/2010  . Vocal cord nodule 03/08/2014  . Weight gain 01/05/2013  . Wrist pain     SURGICAL HISTORY: Past Surgical History:  Procedure Laterality Date  . BREAST LUMPECTOMY Left 11/16/2014  . BREAST LUMPECTOMY WITH AXILLARY LYMPH NODE BIOPSY Left 11/16/2014   Procedure: LEFT BREAST LUMPECTOMY WITH AXILLARY LYMPH NODE BIOPSY;  Surgeon: Stark Klein, MD;  Location: [facility];  Service: General;  Laterality: Left;  . COLONOSCOPY    . CYST REMOVAL WRIST Left   . tubal ligation  1995    I have reviewed the social history and family history with the patient and they are unchanged from previous note.  ALLERGIES:  is allergic to codeine.  MEDICATIONS:  Current Outpatient Medications  Medication Sig Dispense Refill  . aspirin 81 MG tablet Take 81 mg by mouth daily.    . Biotin w/ Vitamins C & E (HAIR/SKIN/NAILS PO) Take 2 tablets by mouth daily.    . calcium carbonate (OS-CAL) 600 MG TABS tablet Take 600 mg by mouth daily with breakfast.    . diphenhydrAMINE (SOMINEX) 25 MG tablet Take 50 mg by mouth at bedtime as needed for sleep.     Marland Kitchen EVENING PRIMROSE OIL PO Take 1,300 mg by mouth daily.    . fish oil-omega-3 fatty acids 1000 MG capsule Take 2 g by mouth daily. Reported on 05/31/2015    . fluticasone (FLONASE) 50 MCG/ACT nasal spray Place 2 sprays into both nostrils daily. 16 g 0  . Inulin (PHILLIPS FIBER GOOD PO) Take by mouth.    . letrozole (FEMARA) 2.5 MG tablet TAKE 1 TABLET BY MOUTH EVERY DAY 90 tablet 2  . Multiple Vitamin (MULTIVITAMIN) tablet Take 1 tablet by mouth daily.     Current Facility-Administered Medications  Medication Dose Route  Frequency Provider Last Rate Last Dose  . influenza vac split quadrivalent PF (FLUARIX) injection 0.5 mL  0.5 mL Intramuscular Once Truitt Merle, MD        PHYSICAL EXAMINATION: ECOG PERFORMANCE STATUS: 0 - Asymptomatic  Vitals:   11/24/18 0807  BP: 139/70  Pulse: 60  Resp: 17  Temp: 97.8 F (36.6 C)  SpO2: 99%   Filed Weights   11/24/18 0807  Weight: 178 lb 14.4 oz (81.1 kg)   GENERAL:alert, no distress and comfortable SKIN: skin color, texture, turgor are normal, no rashes or significant lesions EYES: normal, Conjunctiva are pink and non-injected, sclera clear NECK: supple, thyroid normal size, non-tender, without nodularity LYMPH:  no palpable  lymphadenopathy in the cervical, axillary  LUNGS: clear to auscultation and percussion with normal breathing effort HEART: regular rate & rhythm and no murmurs and no lower extremity edema ABDOMEN:abdomen soft, non-tender and normal bowel sounds Musculoskeletal:no cyanosis of digits and no clubbing  NEURO: alert & oriented x 3 with fluent speech, no focal motor/sensory deficits Breasts: Breast inspection showed them to be symmetrical with no nipple discharge.  The surgical scar in the left axilla and breast has healed very well, there is a 1 cm nodule at the left breast incision likely scar tissue (it's smaller per pt), palpation of the right breast and axilla revealed no obvious mass that I could appreciate.   LABORATORY DATA:  I have reviewed the data as listed CBC Latest Ref Rng & Units 11/24/2018 11/11/2017 09/17/2016  WBC 4.0 - 10.5 K/uL 7.1 6.7 5.8  Hemoglobin 12.0 - 15.0 g/dL 13.7 13.2 13.5  Hematocrit 36.0 - 46.0 % 40.9 38.8 39.5  Platelets 150 - 400 K/uL 270 263 279     CMP Latest Ref Rng & Units 11/11/2017 09/17/2016 05/14/2016  Glucose 70 - 99 mg/dL 96 96 97  BUN 6 - 20 mg/dL 14 12.7 16.3  Creatinine 0.44 - 1.00 mg/dL 0.82 0.8 0.8  Sodium 135 - 145 mmol/L 139 140 140  Potassium 3.5 - 5.1 mmol/L 4.2 4.2 4.2  Chloride 98 -  111 mmol/L 101 - -  CO2 22 - 32 mmol/L '28 29 29  '$ Calcium 8.9 - 10.3 mg/dL 9.9 10.2 10.2  Total Protein 6.5 - 8.1 g/dL 7.2 7.1 7.2  Total Bilirubin 0.3 - 1.2 mg/dL 0.5 0.62 0.47  Alkaline Phos 38 - 126 U/L 99 104 115  AST 15 - 41 U/L '22 23 22  '$ ALT 0 - 44 U/L '20 19 22      '$ RADIOGRAPHIC STUDIES: I have personally reviewed the radiological images as listed and agreed with the findings in the report. No results found.   ASSESSMENT & PLAN:  Shirley Howard is a 58 y.o. female with   1. Left breast invasive ductal carcinoma, pT1cN18mM0, stage IB, grade 3, ER positive, PR positive, HER-2 negative, (+) DCIS, RS 30 -She was diagnosed in 10/2014. She is s/p left breast lumpecomty with RS 30, adjuvant chemo TC and adjuvant radiation.  -She started antiestrogen therapt with Letrozole in 05/2015.  -She received adjuvant chemotherapy due to high risk disease -She is tolerating adjuvant letrozole overall well, with mild to moderate hot flash, mild arthralgia, no other significant side effects. -She is clinically doing well, no new pain or other concerning symptoms.  Exam was unremarkable except scar tissue in the left breast, labs reviewed, CBC normal, CMP still pending.  No clinical concern for recurrence. -I encouraged her to check her cholesterol, glucose on her visit with her PCP next year  -Continue breast cancer surveillance, next mammogram scheduled for November -She did not see her surgeon this year, I will see her back in 6 months.  2. Hot flash -Slightly worse after she started letrozole. So far manageable.  5. Arthralgia  -Overall improved, and manageable  6. Osteopenia -Her 11/2015 DEXA showed osteopenia with lowest T-score -1.2 at right hip, no high risk for fracture. -She will continue calcium and vitamin D -01/2018 DEXA showed osteopenia with lowest t-score -1.1 at right hip, overall stable.   7. Weight gain  -She is a truck driver, sedentary, part of her weight gain is  probably also related to letrozole -I encouraged her to eat healthy, cut  carbohydrate intake, and exercise more.  She agrees. -her weight has been stable lately   Plan -She is clinically doing well we will continue letrozole, I refilled today -Next mammogram scheduled for November -Lab and follow-up in 6 months -flu shot today   No problem-specific Assessment & Plan notes found for this encounter.   No orders of the defined types were placed in this encounter.  All questions were answered. The patient knows to call the clinic with any problems, questions or concerns. No barriers to learning was detected. I spent 15 minutes counseling the patient face to face. The total time spent in the appointment was 20 minutes and more than 50% was on counseling and review of test results     Truitt Merle, MD 11/24/2018   I, Joslyn Devon, am acting as scribe for Truitt Merle, MD.   I have reviewed the above documentation for accuracy and completeness, and I agree with the above.

## 2018-11-24 ENCOUNTER — Inpatient Hospital Stay: Payer: BC Managed Care – PPO | Attending: Hematology

## 2018-11-24 ENCOUNTER — Encounter: Payer: Self-pay | Admitting: Hematology

## 2018-11-24 ENCOUNTER — Inpatient Hospital Stay (HOSPITAL_BASED_OUTPATIENT_CLINIC_OR_DEPARTMENT_OTHER): Payer: BC Managed Care – PPO | Admitting: Hematology

## 2018-11-24 ENCOUNTER — Other Ambulatory Visit: Payer: Self-pay

## 2018-11-24 VITALS — BP 139/70 | HR 60 | Temp 97.8°F | Resp 17 | Ht 62.0 in | Wt 178.9 lb

## 2018-11-24 DIAGNOSIS — M8580 Other specified disorders of bone density and structure, unspecified site: Secondary | ICD-10-CM | POA: Insufficient documentation

## 2018-11-24 DIAGNOSIS — Z170 Estrogen receptor positive status [ER+]: Secondary | ICD-10-CM

## 2018-11-24 DIAGNOSIS — Z23 Encounter for immunization: Secondary | ICD-10-CM | POA: Diagnosis not present

## 2018-11-24 DIAGNOSIS — C50912 Malignant neoplasm of unspecified site of left female breast: Secondary | ICD-10-CM

## 2018-11-24 DIAGNOSIS — Z7982 Long term (current) use of aspirin: Secondary | ICD-10-CM | POA: Diagnosis not present

## 2018-11-24 DIAGNOSIS — C50412 Malignant neoplasm of upper-outer quadrant of left female breast: Secondary | ICD-10-CM | POA: Diagnosis not present

## 2018-11-24 DIAGNOSIS — Z79811 Long term (current) use of aromatase inhibitors: Secondary | ICD-10-CM | POA: Diagnosis not present

## 2018-11-24 DIAGNOSIS — R232 Flushing: Secondary | ICD-10-CM | POA: Insufficient documentation

## 2018-11-24 DIAGNOSIS — Z923 Personal history of irradiation: Secondary | ICD-10-CM | POA: Diagnosis not present

## 2018-11-24 DIAGNOSIS — Z79899 Other long term (current) drug therapy: Secondary | ICD-10-CM | POA: Diagnosis not present

## 2018-11-24 DIAGNOSIS — Z87891 Personal history of nicotine dependence: Secondary | ICD-10-CM | POA: Insufficient documentation

## 2018-11-24 DIAGNOSIS — Z9221 Personal history of antineoplastic chemotherapy: Secondary | ICD-10-CM | POA: Diagnosis not present

## 2018-11-24 LAB — CBC WITH DIFFERENTIAL/PLATELET
Abs Immature Granulocytes: 0.03 10*3/uL (ref 0.00–0.07)
Basophils Absolute: 0 10*3/uL (ref 0.0–0.1)
Basophils Relative: 1 %
Eosinophils Absolute: 0.2 10*3/uL (ref 0.0–0.5)
Eosinophils Relative: 3 %
HCT: 40.9 % (ref 36.0–46.0)
Hemoglobin: 13.7 g/dL (ref 12.0–15.0)
Immature Granulocytes: 0 %
Lymphocytes Relative: 25 %
Lymphs Abs: 1.8 10*3/uL (ref 0.7–4.0)
MCH: 30 pg (ref 26.0–34.0)
MCHC: 33.5 g/dL (ref 30.0–36.0)
MCV: 89.7 fL (ref 80.0–100.0)
Monocytes Absolute: 0.5 10*3/uL (ref 0.1–1.0)
Monocytes Relative: 7 %
Neutro Abs: 4.6 10*3/uL (ref 1.7–7.7)
Neutrophils Relative %: 64 %
Platelets: 270 10*3/uL (ref 150–400)
RBC: 4.56 MIL/uL (ref 3.87–5.11)
RDW: 13.1 % (ref 11.5–15.5)
WBC: 7.1 10*3/uL (ref 4.0–10.5)
nRBC: 0 % (ref 0.0–0.2)

## 2018-11-24 LAB — COMPREHENSIVE METABOLIC PANEL
ALT: 23 U/L (ref 0–44)
AST: 23 U/L (ref 15–41)
Albumin: 4.1 g/dL (ref 3.5–5.0)
Alkaline Phosphatase: 79 U/L (ref 38–126)
Anion gap: 11 (ref 5–15)
BUN: 17 mg/dL (ref 6–20)
CO2: 27 mmol/L (ref 22–32)
Calcium: 9.6 mg/dL (ref 8.9–10.3)
Chloride: 101 mmol/L (ref 98–111)
Creatinine, Ser: 0.83 mg/dL (ref 0.44–1.00)
GFR calc Af Amer: >60 mL/min (ref >60)
GFR calc non Af Amer: >60 mL/min (ref >60)
Glucose, Bld: 104 mg/dL — ABNORMAL HIGH (ref 70–99)
Potassium: 4.3 mmol/L (ref 3.5–5.1)
Sodium: 139 mmol/L (ref 135–145)
Total Bilirubin: 0.5 mg/dL (ref 0.3–1.2)
Total Protein: 7.2 g/dL (ref 6.5–8.1)

## 2018-11-24 MED ORDER — INFLUENZA VAC SPLIT QUAD 0.5 ML IM SUSY
PREFILLED_SYRINGE | INTRAMUSCULAR | Status: AC
Start: 2018-11-24 — End: ?
  Filled 2018-11-24: qty 0.5

## 2018-11-24 MED ORDER — LETROZOLE 2.5 MG PO TABS: 2.5000 mg | ORAL_TABLET | Freq: Every day | ORAL | 3 refills | Status: DC

## 2018-11-24 MED ORDER — INFLUENZA VAC SPLIT QUAD 0.5 ML IM SUSY
0.5000 mL | PREFILLED_SYRINGE | Freq: Once | INTRAMUSCULAR | Status: AC
  Administered 2018-11-24: 0.5 mL via INTRAMUSCULAR

## 2018-11-25 ENCOUNTER — Telehealth: Payer: Self-pay | Admitting: Hematology

## 2018-11-25 NOTE — Telephone Encounter (Signed)
Scheduled appt per 10/19 los.  Tried calling the patient number several times. It kept saying call can not be completed as [place] a staff message and a calendar will be mailed out.

## 2018-11-27 ENCOUNTER — Encounter: Payer: Self-pay | Admitting: Family Medicine

## 2018-11-27 DIAGNOSIS — M8580 Other specified disorders of bone density and structure, unspecified site: Secondary | ICD-10-CM | POA: Insufficient documentation

## 2018-11-27 DIAGNOSIS — Z780 Asymptomatic menopausal state: Secondary | ICD-10-CM | POA: Insufficient documentation

## 2018-12-15 ENCOUNTER — Other Ambulatory Visit: Payer: Self-pay

## 2018-12-15 ENCOUNTER — Ambulatory Visit
Admission: RE | Admit: 2018-12-15 | Discharge: 2018-12-15 | Disposition: A | Discharge status: Home / Self Care | Payer: BC Managed Care – PPO | Source: Ambulatory Visit | Attending: Hematology | Admitting: Hematology

## 2018-12-15 DIAGNOSIS — Z9889 Other specified postprocedural states: Secondary | ICD-10-CM

## 2018-12-15 DIAGNOSIS — R928 Other abnormal and inconclusive findings on diagnostic imaging of breast: Secondary | ICD-10-CM | POA: Diagnosis not present

## 2019-01-19 DIAGNOSIS — U071 COVID-19: Secondary | ICD-10-CM | POA: Diagnosis not present

## 2019-02-04 DIAGNOSIS — U071 COVID-19: Secondary | ICD-10-CM | POA: Diagnosis not present

## 2019-02-09 DIAGNOSIS — Z03818 Encounter for observation for suspected exposure to other biological agents ruled out: Secondary | ICD-10-CM | POA: Diagnosis not present

## 2019-04-28 ENCOUNTER — Other Ambulatory Visit: Payer: Self-pay | Admitting: Hematology

## 2019-04-28 DIAGNOSIS — C50912 Malignant neoplasm of unspecified site of left female breast: Secondary | ICD-10-CM

## 2019-05-21 NOTE — Progress Notes (Signed)
[place]   [phone] Fax:([phone]   Clinic Follow up Note   Patient Care Team: Shirley Howard, Blane Ohara, MD as PCP - General (Family Medicine) Shirley Klein, MD as Consulting Physician (General Surgery) Shirley Merle, MD as Consulting Physician (Hematology) Shirley Koh, MD (Inactive) as Consulting Physician (Radiation Oncology) Shirley Germany, RN as Registered Nurse Shirley Kaufmann, RN as Registered Nurse Shirley Howard, Shirley Blamer, NP as Nurse Practitioner (Nurse Practitioner)  Date of Service:  05/25/2019  CHIEF COMPLAINT: Follow up left breast cancer  SUMMARY OF ONCOLOGIC HISTORY: Oncology History Overview Note  Breast cancer of upper-outer quadrant of left female breast [facility])   Staging form: Breast, AJCC 7th Edition     Clinical stage from 11/10/2014: Stage IIA (T2, N0, M0) - Signed by Shirley Merle, MD on 11/10/2014     Pathologic stage from 11/18/2014: Stage IB (T1c, N44m, cM0) - Signed by JEnid Cutter MD on 12/01/2014       Staging comments: Staged on lumpectomy specimen by Dr. MTresa Moore       Breast cancer of upper-outer quadrant of [place] breast [place]  10/28/2014 Mammogram   Screening and the diagnostic mammogram shows a 2.1 x 1.3 x 2.1 cm it irregular mass in the left breast 2:00 position, 2 cm from the nipple.   11/01/2014 Initial Biopsy   left breast biopsy showed invasive ductal carcinoma, grade 2, and DCIS    11/01/2014 Receptors her2   ER 100%, PR 100%, HER2/neu negative, KI67 60%    11/10/2014 Clinical Stage   Stage IIA: T2 N0   11/16/2014 Surgery   left breast lumpectomy with SLN biopsy.    11/16/2014 Pathology Results   left breast invasive ductal carcinoma, G3, 1 out of 3 SLN had micrometastasis, (-)LVI, margins negative,  (+) high grade DCIS   11/16/2014 Pathologic Stage   Stage IB: T1c N184m  11/16/2014 Oncotype testing   RS 30, predicts 20% 10-y risk of distant recurrence with tamoxifen alone    12/15/2014 - 02/17/2015  Adjuvant Chemotherapy   Docetaxel 75 mg/m, Cytoxan 600 mg/m, every 3 weeks,  total 4 cycles.   03/21/2015 - 05/04/2015 Radiation Therapy   adjuvant breast radiation: Left breast/ 45 Gy at 1.8 Gy per fraction x 25 fractions.  Left breast boost/ 16 Gy at 2 Gy per fraction x 8 fractions   05/21/2015 -  Anti-estrogen oral therapy   Letrozole 2.5 mg daily   06/30/2015 Survivorship   Survivorship visit completed   11/26/2016 Mammogram   IMPRESSION: Stable left breast lumpectomy site. No mammographic evidence of malignancy in the bilateral breasts.      CURRENT THERAPY:  Letrozole 2.'5mg'$  daily started in 05/2015    INTERVAL HISTORY:  Shirley Howard here for a follow up of left breast. She was last seen by me 6 months ago. She presents to the clinic alone. She notes she is doing well. She notes in there last 4 months she has been losing more hair. She notes she had COVID19 in 01/2019. She notes she did not require hospitalization. She notes she has been taking Biotin. She notes she is tolerating Letrozole well with stable joint pain and increase but manageable hot flashes. She denies any new pain. She has adequate energy and appetite.    REVIEW OF SYSTEMS:   Constitutional: Denies fevers, chills or abnormal weight loss Eyes: Denies blurriness of vision Ears, nose, mouth, throat, and face: Denies mucositis or sore throat Respiratory: Denies cough, dyspnea or  wheezes Cardiovascular: Denies palpitation, chest discomfort or lower extremity swelling Gastrointestinal:  Denies nausea, heartburn or change in bowel habits Skin: Denies abnormal skin rashes (+) Hair loss  Lymphatics: Denies new lymphadenopathy or easy bruising Neurological:Denies numbness, tingling or new weaknesses Behavioral/Psych: Mood is stable, no new changes  All other systems were reviewed with the patient and are negative.  MEDICAL HISTORY:  Past Medical History:  Diagnosis Date  . Allergy   . Anxiety   . Arthritis    . Breast cancer ([place])   . Chemotherapy induced neutropenia ([place]) 12/29/2014  . Depression   . Ex-heavy cigarette smoker (20-39 per day) 09/27/2008   Smoked > 100 cigarettes in her life  Quit smoking 11/04/2012    . GERD (gastroesophageal reflux disease)   . History of colon polyps, Tubular Adenoma 03/08/2010  . Hot flashes   . Ingrown nail 09/01/2018  . Kidney stones   . Left breast mass 10/22/2014   Approx. 2 cm by 2 cm mobile hard mass of left breast at 3 oclock position   . Left wrist pain 04/28/2012  . Mucositis due to antineoplastic therapy 12/29/2014  . Mucositis due to chemotherapy 01/06/2015  . Personal history of chemotherapy    2016-2017  . Personal history of radiation therapy    2017  . Poor sleep pattern 11/24/2012  . Rash of hands 12/29/2014  . Sore throat 10/27/2012   10/31/12 [facility] (see scanned document) - vocal cord polyp left, bilateral edema, significant postcricoid edema, c/w chronic tobacco use and chronic reflux. - Recheck 2 months - Encouraged tobacco cessation to reduce risk of laryngeal cancer     . Tubular adenoma of colon 03/2010  . Vocal cord nodule 03/08/2014  . Weight gain 01/05/2013  . Wrist pain     SURGICAL HISTORY: Past Surgical History:  Procedure Laterality Date  . BREAST LUMPECTOMY Left 11/16/2014  . BREAST LUMPECTOMY WITH AXILLARY LYMPH NODE BIOPSY Left 11/16/2014   Procedure: LEFT BREAST LUMPECTOMY WITH AXILLARY LYMPH NODE BIOPSY;  Surgeon: Shirley Klein, MD;  Location: [place];  Service: General;  Laterality: Left;  . COLONOSCOPY    . CYST REMOVAL WRIST Left   . tubal ligation  1995    I have reviewed the social history and family history with the patient and they are unchanged from previous note.  ALLERGIES:  is allergic to codeine.  MEDICATIONS:  Current Outpatient Medications  Medication Sig Dispense Refill  . aspirin 81 MG tablet Take 81 mg by mouth daily.    . Biotin w/ Vitamins C & E (HAIR/SKIN/NAILS PO) Take  2 tablets by mouth daily.    . calcium carbonate (OS-CAL) 600 MG TABS tablet Take 600 mg by mouth daily with breakfast.    . diphenhydrAMINE (SOMINEX) 25 MG tablet Take 50 mg by mouth at bedtime as needed for sleep.     Marland Kitchen EVENING PRIMROSE OIL PO Take 1,300 mg by mouth daily.    . fish oil-omega-3 fatty acids 1000 MG capsule Take 2 g by mouth daily. Reported on 05/31/2015    . fluticasone (FLONASE) 50 MCG/ACT nasal spray Place 2 sprays into both nostrils daily. 16 g 0  . Inulin (PHILLIPS FIBER GOOD PO) Take by mouth.    . letrozole (FEMARA) 2.5 MG tablet TAKE 1 TABLET BY MOUTH EVERY DAY 90 tablet 3  . Multiple Vitamin (MULTIVITAMIN) tablet Take 1 tablet by mouth daily.     No current facility-administered medications for this visit.  PHYSICAL EXAMINATION: ECOG PERFORMANCE STATUS: 0 - Asymptomatic  Vitals:   05/25/19 1105  BP: (!) 145/81  Pulse: 64  Resp: 17  Temp: 98.2 F (36.8 C)  SpO2: 98%   Filed Weights   05/25/19 1105  Weight: 182 lb 14.4 oz (83 kg)    GENERAL:alert, no distress and comfortable SKIN: skin color, texture, turgor are normal, no rashes or significant lesions EYES: normal, Conjunctiva are pink and non-injected, sclera clear  NECK: supple, thyroid normal size, non-tender, without nodularity LYMPH:  no palpable lymphadenopathy in the cervical, axillary  LUNGS: clear to auscultation and percussion with normal breathing effort HEART: regular rate & rhythm and no murmurs and no lower extremity edema ABDOMEN:abdomen soft, non-tender and normal bowel sounds Musculoskeletal:no cyanosis of digits and no clubbing  NEURO: alert & oriented x 3 with fluent speech, no focal motor/sensory deficits BREAST: s/p left lumpectomy: Surgical incision in left lateral breast with scar tissue and 1cm nodule. No palpable mass, nodules or adenopathy bilaterally. Breast exam benign.   LABORATORY DATA:  I have reviewed the data as listed CBC Latest Ref Rng & Units 05/25/2019  11/24/2018 11/11/2017  WBC 4.0 - 10.5 K/uL 7.3 7.1 6.7  Hemoglobin 12.0 - 15.0 g/dL 13.2 13.7 13.2  Hematocrit 36.0 - 46.0 % 40.4 40.9 38.8  Platelets 150 - 400 K/uL 297 270 263     CMP Latest Ref Rng & Units 05/25/2019 11/24/2018 11/11/2017  Glucose 70 - 99 mg/dL 81 104(H) 96  BUN 6 - 20 mg/dL '19 17 14  '$ Creatinine 0.44 - 1.00 mg/dL 0.81 0.83 0.82  Sodium 135 - 145 mmol/L 139 139 139  Potassium 3.5 - 5.1 mmol/L 5.0 4.3 4.2  Chloride 98 - 111 mmol/L 104 101 101  CO2 22 - 32 mmol/L '28 27 28  '$ Calcium 8.9 - 10.3 mg/dL 9.8 9.6 9.9  Total Protein 6.5 - 8.1 g/dL 7.3 7.2 7.2  Total Bilirubin 0.3 - 1.2 mg/dL 0.4 0.5 0.5  Alkaline Phos 38 - 126 U/L 93 79 99  AST 15 - 41 U/L '23 23 22  '$ ALT 0 - 44 U/L '25 23 20      '$ RADIOGRAPHIC STUDIES: I have personally reviewed the radiological images as listed and agreed with the findings in the report. No results found.   ASSESSMENT & PLAN:  Shirley Howard is a 59 y.o. female with    1. Left breast invasive ductal carcinoma, pT1cN8mM0, stage IB, grade 3, ER positive, PR positive, HER-2 negative, (+) DCIS, RS 30 -She was diagnosed in 10/2014. She is s/p left breast lumpectomy with RS 30, adjuvant chemo TC and adjuvant radiation.  -She started antiestrogen therapy with Letrozole in 05/2015. Tolerating well with mild to moderate hot flash, mild arthralgia, no other significant side effects. -She is clinically doing well. Lab reviewed, her CBC and CMP are within normal limits. Her physical exam and her 12/2018 mammogram were unremarkable. She did have benign 1cm nodule in her left breast incision, likely scar tissue. There is no clinical concern for recurrence.  -Will continue Surveillance. Next mammogram in 12/2019 -Continue Letrozole.  -F/u in 6 months   2. Hot flash -Slightly worse after she started letrozole. So far manageable.  5. Arthralgia  -Overall improved, and manageable. Not exacerbated by Letrozole   6. Osteopenia -Her 11/2015 DEXA  showed osteopenia with lowest T-score -1.2 at right hip, no high risk for fracture. -01/2018 DEXA showed osteopenia with lowest t-score -1.1 at right hip, overall stable. Next in 01/2020.  -She  will continue calcium and vitamin D  7. 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 She agrees.  Plan -She is clinically doing well we will continue letrozole, I refilled today -Lab and follow-up in 6 months, will order mammogram on next visit    No problem-specific Assessment & Plan notes found for this encounter.   No orders of the defined types were placed in this encounter.  All questions were answered. The patient knows to call the clinic with any problems, questions or concerns. No barriers to learning was detected. The total time spent in the appointment was 25 minutes.     Shirley Merle, MD 05/25/2019   I, Joslyn Devon, am acting as scribe for Shirley Merle, MD.   I have reviewed the above documentation for accuracy and completeness, and I agree with the above.

## 2019-05-25 ENCOUNTER — Telehealth: Payer: Self-pay | Admitting: Hematology

## 2019-05-25 ENCOUNTER — Inpatient Hospital Stay (HOSPITAL_BASED_OUTPATIENT_CLINIC_OR_DEPARTMENT_OTHER): Payer: BC Managed Care – PPO | Admitting: Hematology

## 2019-05-25 ENCOUNTER — Inpatient Hospital Stay: Payer: BC Managed Care – PPO | Attending: Hematology

## 2019-05-25 ENCOUNTER — Encounter: Payer: Self-pay | Admitting: Hematology

## 2019-05-25 ENCOUNTER — Other Ambulatory Visit: Payer: Self-pay

## 2019-05-25 VITALS — BP 145/81 | HR 64 | Temp 98.2°F | Resp 17 | Ht 62.0 in | Wt 182.9 lb

## 2019-05-25 DIAGNOSIS — Z79899 Other long term (current) drug therapy: Secondary | ICD-10-CM | POA: Diagnosis not present

## 2019-05-25 DIAGNOSIS — R635 Abnormal weight gain: Secondary | ICD-10-CM | POA: Insufficient documentation

## 2019-05-25 DIAGNOSIS — C50412 Malignant neoplasm of upper-outer quadrant of left female breast: Secondary | ICD-10-CM

## 2019-05-25 DIAGNOSIS — Z87442 Personal history of urinary calculi: Secondary | ICD-10-CM | POA: Insufficient documentation

## 2019-05-25 DIAGNOSIS — Z170 Estrogen receptor positive status [ER+]: Secondary | ICD-10-CM

## 2019-05-25 DIAGNOSIS — Z79811 Long term (current) use of aromatase inhibitors: Secondary | ICD-10-CM | POA: Diagnosis not present

## 2019-05-25 DIAGNOSIS — Z87891 Personal history of nicotine dependence: Secondary | ICD-10-CM | POA: Diagnosis not present

## 2019-05-25 DIAGNOSIS — Z8719 Personal history of other diseases of the digestive system: Secondary | ICD-10-CM | POA: Diagnosis not present

## 2019-05-25 DIAGNOSIS — M8580 Other specified disorders of bone density and structure, unspecified site: Secondary | ICD-10-CM | POA: Insufficient documentation

## 2019-05-25 DIAGNOSIS — Z9221 Personal history of antineoplastic chemotherapy: Secondary | ICD-10-CM | POA: Insufficient documentation

## 2019-05-25 DIAGNOSIS — Z7982 Long term (current) use of aspirin: Secondary | ICD-10-CM | POA: Insufficient documentation

## 2019-05-25 DIAGNOSIS — Z923 Personal history of irradiation: Secondary | ICD-10-CM | POA: Diagnosis not present

## 2019-05-25 DIAGNOSIS — R232 Flushing: Secondary | ICD-10-CM | POA: Insufficient documentation

## 2019-05-25 DIAGNOSIS — M1990 Unspecified osteoarthritis, unspecified site: Secondary | ICD-10-CM | POA: Insufficient documentation

## 2019-05-25 DIAGNOSIS — K219 Gastro-esophageal reflux disease without esophagitis: Secondary | ICD-10-CM | POA: Diagnosis not present

## 2019-05-25 LAB — CBC WITH DIFFERENTIAL/PLATELET
Abs Immature Granulocytes: 0.03 10*3/uL (ref 0.00–0.07)
Basophils Absolute: 0.1 10*3/uL (ref 0.0–0.1)
Basophils Relative: 1 %
Eosinophils Absolute: 0.2 10*3/uL (ref 0.0–0.5)
Eosinophils Relative: 2 %
HCT: 40.4 % (ref 36.0–46.0)
Hemoglobin: 13.2 g/dL (ref 12.0–15.0)
Immature Granulocytes: 0 %
Lymphocytes Relative: 28 %
Lymphs Abs: 2.1 10*3/uL (ref 0.7–4.0)
MCH: 29.9 pg (ref 26.0–34.0)
MCHC: 32.7 g/dL (ref 30.0–36.0)
MCV: 91.6 fL (ref 80.0–100.0)
Monocytes Absolute: 0.6 10*3/uL (ref 0.1–1.0)
Monocytes Relative: 8 %
Neutro Abs: 4.4 10*3/uL (ref 1.7–7.7)
Neutrophils Relative %: 61 %
Platelets: 297 10*3/uL (ref 150–400)
RBC: 4.41 MIL/uL (ref 3.87–5.11)
RDW: 13.3 % (ref 11.5–15.5)
WBC: 7.3 10*3/uL (ref 4.0–10.5)
nRBC: 0 % (ref 0.0–0.2)

## 2019-05-25 LAB — CMP (CANCER CENTER ONLY)
ALT: 25 U/L (ref 0–44)
AST: 23 U/L (ref 15–41)
Albumin: 4 g/dL (ref 3.5–5.0)
Alkaline Phosphatase: 93 U/L (ref 38–126)
Anion gap: 7 (ref 5–15)
BUN: 19 mg/dL (ref 6–20)
CO2: 28 mmol/L (ref 22–32)
Calcium: 9.8 mg/dL (ref 8.9–10.3)
Chloride: 104 mmol/L (ref 98–111)
Creatinine: 0.81 mg/dL (ref 0.44–1.00)
GFR, Est AFR Am: >60 mL/min (ref >60)
GFR, Estimated: >60 mL/min (ref >60)
Glucose, Bld: 81 mg/dL (ref 70–99)
Potassium: 5 mmol/L (ref 3.5–5.1)
Sodium: 139 mmol/L (ref 135–145)
Total Bilirubin: 0.4 mg/dL (ref 0.3–1.2)
Total Protein: 7.3 g/dL (ref 6.5–8.1)

## 2019-05-25 NOTE — Telephone Encounter (Signed)
Scheduled per 04/19 los, patient will be notified per My chart.

## 2019-07-18 ENCOUNTER — Encounter: Payer: Self-pay | Admitting: Hematology

## 2019-07-22 ENCOUNTER — Other Ambulatory Visit: Payer: Self-pay

## 2019-07-22 DIAGNOSIS — C50912 Malignant neoplasm of unspecified site of left female breast: Secondary | ICD-10-CM

## 2019-07-22 MED ORDER — LETROZOLE 2.5 MG PO TABS: 2.5000 mg | ORAL_TABLET | Freq: Every day | ORAL | 4 refills | Status: DC

## 2019-10-17 ENCOUNTER — Other Ambulatory Visit: Payer: Self-pay | Admitting: Hematology

## 2019-10-17 DIAGNOSIS — C50912 Malignant neoplasm of unspecified site of left female breast: Secondary | ICD-10-CM

## 2019-11-11 ENCOUNTER — Other Ambulatory Visit: Payer: Self-pay | Admitting: Hematology

## 2019-11-11 DIAGNOSIS — Z0000 Encounter for general adult medical examination without abnormal findings: Secondary | ICD-10-CM

## 2019-11-20 NOTE — Progress Notes (Signed)
[place]   [phone] Fax:(336) [phone]   Clinic Follow up Note   Patient Care Team: McDiarmid, Blane Ohara, MD as PCP - General (Family Medicine) Stark Klein, MD as Consulting Physician (General Surgery) Truitt Merle, MD as Consulting Physician (Hematology) Arloa Koh, MD (Inactive) as Consulting Physician (Radiation Oncology) Rockwell Germany, RN as Registered Nurse Mauro Kaufmann, RN as Registered Nurse Jake Shark, Johny Blamer, NP as Nurse Practitioner (Nurse Practitioner)  Date of Service:  11/23/2019  CHIEF COMPLAINT: Follow up left breast cancer  SUMMARY OF ONCOLOGIC HISTORY: Oncology History Overview Note  Breast cancer of upper-outer quadrant of left female breast [facility])   Staging form: Breast, AJCC 7th Edition     Clinical stage from 11/10/2014: Stage IIA (T2, N0, M0) - Signed by Truitt Merle, MD on 11/10/2014     Pathologic stage from 11/18/2014: Stage IB (T1c, N63mi, cM0) - Signed by Enid Cutter, MD on 12/01/2014       Staging comments: Staged on lumpectomy specimen by Dr. Tresa Moore        Breast cancer of upper-outer quadrant of left female breast ([place])  10/28/2014 Mammogram   Screening and the diagnostic mammogram shows a 2.1 x 1.3 x 2.1 cm it irregular mass in the left breast 2:00 position, 2 cm from the nipple.   11/01/2014 Initial Biopsy   left breast biopsy showed invasive ductal carcinoma, grade 2, and DCIS    11/01/2014 Receptors her2   ER 100%, PR 100%, HER2/neu negative, KI67 60%    11/10/2014 Clinical Stage   Stage IIA: T2 N0   11/16/2014 Surgery   left breast lumpectomy with SLN biopsy.    11/16/2014 Pathology Results   left breast invasive ductal carcinoma, G3, 1 out of 3 SLN had micrometastasis, (-)LVI, margins negative,  (+) high grade DCIS   11/16/2014 Pathologic Stage   Stage IB: T1c N57mi   11/16/2014 Oncotype testing   RS 30, predicts 20% 10-y risk of distant recurrence with tamoxifen alone    12/15/2014 - 02/17/2015  Adjuvant Chemotherapy   Docetaxel 75 mg/m, Cytoxan 600 mg/m, every 3 weeks,  total 4 cycles.   03/21/2015 - 05/04/2015 Radiation Therapy   adjuvant breast radiation: Left breast/ 45 Gy at 1.8 Gy per fraction x 25 fractions.  Left breast boost/ 16 Gy at 2 Gy per fraction x 8 fractions   05/21/2015 -  Anti-estrogen oral therapy   Letrozole 2.5 mg daily   06/30/2015 Survivorship   Survivorship visit completed   11/26/2016 Mammogram   IMPRESSION: Stable left breast lumpectomy site. No mammographic evidence of malignancy in the bilateral breasts.      CURRENT THERAPY:  Letrozole 2.$RemoveBefo'5mg'WEjjWojwXmi$  daily started in 05/2015  INTERVAL HISTORY:  Shirley Howard is here for a follow up of left breast cancer. She was last seen by me 6 months ago. She presents to the clinic alone. Overall doing well  Hot flush managable Join pain at finger,  No impact on her function  She gained about 30lbs since she started letrozole, tried to loose weight but not successful. Weight stable lately   All other systems were reviewed with the patient and are negative.  MEDICAL HISTORY:  Past Medical History:  Diagnosis Date  . Allergy   . Anxiety   . Arthritis   . Breast cancer ([place])   . Chemotherapy induced neutropenia ([place]) 12/29/2014  . Depression   . Ex-heavy cigarette smoker (20-39 per day) 09/27/2008   Smoked > 100 cigarettes in her life  Quit smoking 11/04/2012    . GERD (gastroesophageal reflux disease)   . History of colon polyps, Tubular Adenoma 03/08/2010  . Hot flashes   . Ingrown nail 09/01/2018  . Kidney stones   . Left breast mass 10/22/2014   Approx. 2 cm by 2 cm mobile hard mass of left breast at 3 oclock position   . Left wrist pain 04/28/2012  . Mucositis due to antineoplastic therapy 12/29/2014  . Mucositis due to chemotherapy 01/06/2015  . Personal history of chemotherapy    2016-2017  . Personal history of radiation therapy    2017  . Poor sleep pattern 11/24/2012  . Rash of hands 12/29/2014   . Sore throat 10/27/2012   10/31/12 [facility] (see scanned document) - vocal cord polyp left, bilateral edema, significant postcricoid edema, c/w chronic tobacco use and chronic reflux. - Recheck 2 months - Encouraged tobacco cessation to reduce risk of laryngeal cancer     . Tubular adenoma of colon 03/2010  . Vocal cord nodule 03/08/2014  . Weight gain 01/05/2013  . Wrist pain     SURGICAL HISTORY: Past Surgical History:  Procedure Laterality Date  . BREAST LUMPECTOMY Left 11/16/2014  . BREAST LUMPECTOMY WITH AXILLARY LYMPH NODE BIOPSY Left 11/16/2014   Procedure: LEFT BREAST LUMPECTOMY WITH AXILLARY LYMPH NODE BIOPSY;  Surgeon: Stark Klein, MD;  Location: [place];  Service: General;  Laterality: Left;  . COLONOSCOPY    . CYST REMOVAL WRIST Left   . tubal ligation  1995    I have reviewed the social history and family history with the patient and they are unchanged from previous note.  ALLERGIES:  is allergic to codeine.  MEDICATIONS:  Current Outpatient Medications  Medication Sig Dispense Refill  . aspirin 81 MG tablet Take 81 mg by mouth daily.    . Biotin w/ Vitamins C & E (HAIR/SKIN/NAILS PO) Take 2 tablets by mouth daily.    . calcium carbonate (OS-CAL) 600 MG TABS tablet Take 600 mg by mouth daily with breakfast.    . diphenhydrAMINE (SOMINEX) 25 MG tablet Take 50 mg by mouth at bedtime as needed for sleep.     Marland Kitchen EVENING PRIMROSE OIL PO Take 1,300 mg by mouth daily.    . fish oil-omega-3 fatty acids 1000 MG capsule Take 2 g by mouth daily. Reported on 05/31/2015    . fluticasone (FLONASE) 50 MCG/ACT nasal spray Place 2 sprays into both nostrils daily. 16 g 0  . Inulin (PHILLIPS FIBER GOOD PO) Take by mouth.    . letrozole (FEMARA) 2.5 MG tablet TAKE 1 TABLET BY MOUTH EVERY DAY 90 tablet 1  . Multiple Vitamin (MULTIVITAMIN) tablet Take 1 tablet by mouth daily.     No current facility-administered medications for this visit.    PHYSICAL  EXAMINATION: ECOG PERFORMANCE STATUS: 0 - Asymptomatic  Vitals:   11/23/19 0816  BP: 137/76  Pulse: 62  Resp: 20  Temp: 98.3 F (36.8 C)   Filed Weights   11/23/19 0816  Weight: 179 lb 11.2 oz (81.5 kg)    GENERAL:alert, no distress and comfortable SKIN: skin color, texture, turgor are normal, no rashes or significant lesions EYES: normal, Conjunctiva are pink and non-injected, sclera clear NECK: supple, thyroid normal size, non-tender, without nodularity LYMPH:  no palpable lymphadenopathy in the cervical, axillary  LUNGS: clear to auscultation and percussion with normal breathing effort HEART: regular rate & rhythm and no murmurs and no lower extremity edema ABDOMEN:abdomen soft, non-tender and  normal bowel sounds Musculoskeletal:no cyanosis of digits and no clubbing  NEURO: alert & oriented x 3 with fluent speech, no focal motor/sensory deficits Breasts: Breast inspection showed them to be symmetrical with no nipple discharge. (+) Skin retraction at the surgical incision site of left breast, there is a 1 cm subcutaneous nodule above the incision line.  Palpation of the right breast and axillas revealed no obvious mass that I could appreciate.   LABORATORY DATA:  I have reviewed the data as listed CBC Latest Ref Rng & Units 11/23/2019 05/25/2019 11/24/2018  WBC 4.0 - 10.5 K/uL 6.8 7.3 7.1  Hemoglobin 12.0 - 15.0 g/dL 13.8 13.2 13.7  Hematocrit 36 - 46 % 41.8 40.4 40.9  Platelets 150 - 400 K/uL 272 297 270     CMP Latest Ref Rng & Units 05/25/2019 11/24/2018 11/11/2017  Glucose 70 - 99 mg/dL 81 104(H) 96  BUN 6 - 20 mg/dL $Remove'19 17 14  'BwGYRTn$ Creatinine 0.44 - 1.00 mg/dL 0.81 0.83 0.82  Sodium 135 - 145 mmol/L 139 139 139  Potassium 3.5 - 5.1 mmol/L 5.0 4.3 4.2  Chloride 98 - 111 mmol/L 104 101 101  CO2 22 - 32 mmol/L $RemoveB'28 27 28  'CbLYlBgZ$ Calcium 8.9 - 10.3 mg/dL 9.8 9.6 9.9  Total Protein 6.5 - 8.1 g/dL 7.3 7.2 7.2  Total Bilirubin 0.3 - 1.2 mg/dL 0.4 0.5 0.5  Alkaline Phos 38 - 126 U/L 93  79 99  AST 15 - 41 U/L $Remo'23 23 22  'LSMlP$ ALT 0 - 44 U/L $Remo'25 23 20      'CNobY$ RADIOGRAPHIC STUDIES: I have personally reviewed the radiological images as listed and agreed with the findings in the report. No results found.   ASSESSMENT & PLAN:  Alysia Scism is a 59 y.o. female with    1. Left breast invasive ductal carcinoma, pT1cN60miM0, stage IB, grade 3, ER positive, PR positive, HER-2 negative, (+) DCIS, RS 30 -She was diagnosed in 10/2014. She is s/p left breast lumpectomy with RS 30, adjuvant chemo TC and adjuvant radiation.  -She started antiestrogen therapy with Letrozole in 05/2015. Tolerating well with mild to moderate hot flash, mild arthralgia,and weight gain, no other significant side effects. -She is clinically doing well. Lab reviewed, her CBC and CMP are within normal limits. Her physical exam and her 12/2018 mammogram were unremarkable. She did have benign 1cm nodule in her left breast incision, likely scar tissue. There is no clinical concern for recurrence.  -Will continue Surveillance. Next mammogram in 12/2019 is scheduled  -Continue Letrozole, plan for 7 years, she is agreeable, her main concerns about the medicine is weight gain which is stable lately  -F/u in 6 months, then yearly after next visit   2. Hot flash -Slightly worse after she started letrozole. So far manageable.  3. Arthralgia -Overall improved, and manageable. Not exacerbated by Letrozole   4. Osteopenia -Her 11/2015 DEXA showed osteopeniawith lowest T-score -1.2 at right hip, no high risk for fracture. -01/2018 DEXA showed osteopenia with lowest t-score -1.1 at right hip, overall stable.Next in 01/2020.  -She will continue calcium and vitamin D  5. 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 She agrees. -Her weight has been stable  lately  Plan -She is doing well overall, continue to treat -Mammogram is scheduled for next month, I ordered the DEXA scan to be done  in December -Lab and follow-up in 6 months    No problem-specific Assessment & Plan notes found for this encounter.   Orders Placed This Encounter  Procedures  . DG Bone Density    Standing Status:   Future    Standing Expiration Date:   11/22/2020    Order Specific Question:   Reason for Exam (SYMPTOM  OR DIAGNOSIS REQUIRED)    Answer:   screening    Order Specific Question:   Preferred imaging location?    Answer:   [facility]   All questions were answered. The patient knows to call the clinic with any problems, questions or concerns. No barriers to learning was detected. The total time spent in the appointment was 25 minutes.     Truitt Merle, MD 11/23/2019   I, Joslyn Devon, am acting as scribe for Truitt Merle, MD.   I have reviewed the above documentation for accuracy and completeness, and I agree with the above.

## 2019-11-23 ENCOUNTER — Encounter: Payer: Self-pay | Admitting: Hematology

## 2019-11-23 ENCOUNTER — Telehealth: Payer: Self-pay | Admitting: Hematology

## 2019-11-23 ENCOUNTER — Inpatient Hospital Stay: Payer: BC Managed Care – PPO | Admitting: Hematology

## 2019-11-23 ENCOUNTER — Other Ambulatory Visit: Payer: Self-pay

## 2019-11-23 ENCOUNTER — Inpatient Hospital Stay: Payer: BC Managed Care – PPO | Attending: Hematology

## 2019-11-23 VITALS — BP 137/76 | HR 62 | Temp 98.3°F | Resp 20 | Ht 62.0 in | Wt 179.7 lb

## 2019-11-23 DIAGNOSIS — Z923 Personal history of irradiation: Secondary | ICD-10-CM | POA: Insufficient documentation

## 2019-11-23 DIAGNOSIS — E2839 Other primary ovarian failure: Secondary | ICD-10-CM

## 2019-11-23 DIAGNOSIS — M8580 Other specified disorders of bone density and structure, unspecified site: Secondary | ICD-10-CM | POA: Diagnosis not present

## 2019-11-23 DIAGNOSIS — Z9221 Personal history of antineoplastic chemotherapy: Secondary | ICD-10-CM | POA: Insufficient documentation

## 2019-11-23 DIAGNOSIS — Z87891 Personal history of nicotine dependence: Secondary | ICD-10-CM | POA: Insufficient documentation

## 2019-11-23 DIAGNOSIS — M2550 Pain in unspecified joint: Secondary | ICD-10-CM | POA: Diagnosis not present

## 2019-11-23 DIAGNOSIS — Z170 Estrogen receptor positive status [ER+]: Secondary | ICD-10-CM

## 2019-11-23 DIAGNOSIS — R232 Flushing: Secondary | ICD-10-CM | POA: Insufficient documentation

## 2019-11-23 DIAGNOSIS — Z79811 Long term (current) use of aromatase inhibitors: Secondary | ICD-10-CM | POA: Insufficient documentation

## 2019-11-23 DIAGNOSIS — Z79899 Other long term (current) drug therapy: Secondary | ICD-10-CM | POA: Diagnosis not present

## 2019-11-23 DIAGNOSIS — R635 Abnormal weight gain: Secondary | ICD-10-CM | POA: Insufficient documentation

## 2019-11-23 DIAGNOSIS — Z7982 Long term (current) use of aspirin: Secondary | ICD-10-CM | POA: Insufficient documentation

## 2019-11-23 DIAGNOSIS — C50412 Malignant neoplasm of upper-outer quadrant of left female breast: Secondary | ICD-10-CM

## 2019-11-23 DIAGNOSIS — Z86010 Personal history of colonic polyps: Secondary | ICD-10-CM | POA: Insufficient documentation

## 2019-11-23 DIAGNOSIS — K219 Gastro-esophageal reflux disease without esophagitis: Secondary | ICD-10-CM | POA: Insufficient documentation

## 2019-11-23 LAB — CMP (CANCER CENTER ONLY)
ALT: 23 U/L (ref 0–44)
AST: 21 U/L (ref 15–41)
Albumin: 4 g/dL (ref 3.5–5.0)
Alkaline Phosphatase: 94 U/L (ref 38–126)
Anion gap: 5 (ref 5–15)
BUN: 17 mg/dL (ref 8–23)
CO2: 30 mmol/L (ref 22–32)
Calcium: 10.1 mg/dL (ref 8.9–10.3)
Chloride: 102 mmol/L (ref 98–111)
Creatinine: 0.83 mg/dL (ref 0.44–1.00)
GFR, Estimated: >60 mL/min (ref >60)
Glucose, Bld: 107 mg/dL — ABNORMAL HIGH (ref 70–99)
Potassium: 4.7 mmol/L (ref 3.5–5.1)
Sodium: 137 mmol/L (ref 135–145)
Total Bilirubin: 0.4 mg/dL (ref 0.3–1.2)
Total Protein: 7.2 g/dL (ref 6.5–8.1)

## 2019-11-23 LAB — CBC WITH DIFFERENTIAL/PLATELET
Abs Immature Granulocytes: 0.03 10*3/uL (ref 0.00–0.07)
Basophils Absolute: 0 10*3/uL (ref 0.0–0.1)
Basophils Relative: 1 %
Eosinophils Absolute: 0.2 10*3/uL (ref 0.0–0.5)
Eosinophils Relative: 3 %
HCT: 41.8 % (ref 36.0–46.0)
Hemoglobin: 13.8 g/dL (ref 12.0–15.0)
Immature Granulocytes: 0 %
Lymphocytes Relative: 29 %
Lymphs Abs: 2 10*3/uL (ref 0.7–4.0)
MCH: 29.8 pg (ref 26.0–34.0)
MCHC: 33 g/dL (ref 30.0–36.0)
MCV: 90.3 fL (ref 80.0–100.0)
Monocytes Absolute: 0.6 10*3/uL (ref 0.1–1.0)
Monocytes Relative: 8 %
Neutro Abs: 4 10*3/uL (ref 1.7–7.7)
Neutrophils Relative %: 59 %
Platelets: 272 10*3/uL (ref 150–400)
RBC: 4.63 MIL/uL (ref 3.87–5.11)
RDW: 13.3 % (ref 11.5–15.5)
WBC: 6.8 10*3/uL (ref 4.0–10.5)
nRBC: 0 % (ref 0.0–0.2)

## 2019-11-23 NOTE — Telephone Encounter (Signed)
Scheduled per 10/18 los. Printed avs and calendar for pt.

## 2019-12-21 ENCOUNTER — Ambulatory Visit
Admission: RE | Admit: 2019-12-21 | Discharge: 2019-12-21 | Disposition: A | Discharge status: Home / Self Care | Payer: BC Managed Care – PPO | Source: Ambulatory Visit | Attending: Hematology | Admitting: Hematology

## 2019-12-21 ENCOUNTER — Other Ambulatory Visit: Payer: Self-pay

## 2019-12-21 ENCOUNTER — Other Ambulatory Visit: Payer: Self-pay | Admitting: Hematology

## 2019-12-21 DIAGNOSIS — Z0000 Encounter for general adult medical examination without abnormal findings: Secondary | ICD-10-CM

## 2019-12-21 DIAGNOSIS — Z9889 Other specified postprocedural states: Secondary | ICD-10-CM

## 2019-12-21 DIAGNOSIS — R922 Inconclusive mammogram: Secondary | ICD-10-CM | POA: Diagnosis not present

## 2020-01-11 ENCOUNTER — Encounter: Payer: Self-pay | Admitting: Hematology

## 2020-03-13 DIAGNOSIS — Z0000 Encounter for general adult medical examination without abnormal findings: Secondary | ICD-10-CM | POA: Insufficient documentation

## 2020-03-13 NOTE — Progress Notes (Signed)
    SUBJECTIVE:   CHIEF COMPLAINT / HPI:   Physical/health maintenance visit: Patient presenting today for routine health maintenance exam.  The patient is a 5-year breast cancer survivor!! She has been followed closely by hematology oncology several CBC/CMP labs in the past, however has not had lipid panel drawn.  Patient would like to have this done today.  Hypercholesterolemia: Patient's lipid panel from today 03/14/2020 showing cholesterol elevated at 242, triglycerides 267 (patient not fasting), LDL at 145 and VLDL elevated at 49.  Patient is not currently taking any lipid-lowering medication.  ASCVD risk of 4%.  Screen for diabetes: Patient's BMI noted to be 34 and she has had some slightly elevated glucose readings on previous CMP's, patient would benefit from A1c screening   Health maintenance: Patient had colonoscopy performed 2017 and is due for repeat this year in May 2022, referral to GI made today to set up colonoscopy.  Patient is due for repeat Pap smear later this month February 2022.  She plans to reschedule for this.  Patient received Tdap at today's visit.  PERTINENT  PMH / PSH:  Patient Active Problem List   Diagnosis Date Noted  . Screening for cholesterol level 03/14/2020  . Need for diphtheria-tetanus-pertussis (Tdap) vaccine 03/14/2020  . Encounter for wellness examination in adult 03/13/2020  . Osteopenia after menopause 11/27/2018  . Ingrown nail 09/01/2018  . Breast cancer of upper-outer quadrant of left female breast ([place]) 11/04/2014  . History of colon polyps, Tubular Adenoma 03/08/2010  . Hyperlipidemia 08/26/2009  . MENOPAUSAL SYNDROME 09/27/2008     OBJECTIVE:   BP 120/80   Pulse 64   Ht 5\' 2"  (1.575 m)   Wt 188 lb (85.3 kg)   SpO2 95%   BMI 34.39 kg/m    Physical exam: General: Well-appearing patient, no apparent distress Respiratory: CTA bilaterally, comfortable work of breathing Cardio: RRR, S1-S2 present, no murmurs appreciated Abdomen:  Normal bowel sounds appreciated   ASSESSMENT/PLAN:   Screening for diabetes A1c checked today, normal at 5.6%.  Hyperlipidemia Patient with mixed hyperlipidemia, LDL elevated at 145.  ASCVD risk only 4%, A1c checked today normal at 5.6%. -Contact patient regarding starting low-dose statin   Need for diphtheria-tetanus-pertussis (Tdap) vaccine -Patient given Tdap vaccine today  History of colon polyps, Tubular Adenoma Patient with history of colonic polyps, last colonoscopy performed in 2017, patient needs repeat in May 2022. -Referral made today to GI for colonoscopy  Encounter for wellness examination in adult -Patient to reschedule her Pap smear     Daisy Floro, [place]

## 2020-03-14 ENCOUNTER — Ambulatory Visit: Payer: BC Managed Care – PPO | Admitting: Family Medicine

## 2020-03-14 ENCOUNTER — Other Ambulatory Visit: Payer: Self-pay

## 2020-03-14 ENCOUNTER — Encounter: Payer: Self-pay | Admitting: Family Medicine

## 2020-03-14 VITALS — BP 120/80 | HR 64 | Ht 62.0 in | Wt 188.0 lb

## 2020-03-14 DIAGNOSIS — Z1211 Encounter for screening for malignant neoplasm of colon: Secondary | ICD-10-CM

## 2020-03-14 DIAGNOSIS — Z0000 Encounter for general adult medical examination without abnormal findings: Secondary | ICD-10-CM

## 2020-03-14 DIAGNOSIS — Z13220 Encounter for screening for lipoid disorders: Secondary | ICD-10-CM | POA: Diagnosis not present

## 2020-03-14 DIAGNOSIS — E782 Mixed hyperlipidemia: Secondary | ICD-10-CM

## 2020-03-14 DIAGNOSIS — Z131 Encounter for screening for diabetes mellitus: Secondary | ICD-10-CM | POA: Diagnosis not present

## 2020-03-14 DIAGNOSIS — Z23 Encounter for immunization: Secondary | ICD-10-CM | POA: Insufficient documentation

## 2020-03-14 DIAGNOSIS — Z86010 Personal history of colonic polyps: Secondary | ICD-10-CM

## 2020-03-14 LAB — POCT GLYCOSYLATED HEMOGLOBIN (HGB A1C): Hemoglobin A1C: 5.6 % (ref 4.0–5.6)

## 2020-03-14 NOTE — Patient Instructions (Signed)
Thank you for coming in to see [place] today! Please see below to review our plan for today's visit:  1. I have referred you to GI for colonoscopy. 2. We are checking your cholesterol panel and A1c to rule out high cholesterol and to rule out diabetes. 3. Come back for Pap smear!   Please call the clinic at [phone] if your symptoms worsen or you have any concerns. It was our pleasure to serve you!   Dr. Milus Banister [facility]

## 2020-03-15 LAB — LIPID PANEL
Chol/HDL Ratio: 5 ratio — ABNORMAL HIGH (ref 0.0–4.4)
Cholesterol, Total: 242 mg/dL — ABNORMAL HIGH (ref 100–199)
HDL: 48 mg/dL (ref >39)
LDL Chol Calc (NIH): 145 mg/dL — ABNORMAL HIGH (ref 0–99)
Triglycerides: 267 mg/dL — ABNORMAL HIGH (ref 0–149)
VLDL Cholesterol Cal: 49 mg/dL — ABNORMAL HIGH (ref 5–40)

## 2020-03-16 NOTE — Assessment & Plan Note (Signed)
Patient with history of colonic polyps, last colonoscopy performed in 2017, patient needs repeat in May 2022. -Referral made today to GI for colonoscopy

## 2020-03-16 NOTE — Assessment & Plan Note (Signed)
-  Patient to reschedule her Pap smear

## 2020-03-16 NOTE — Assessment & Plan Note (Signed)
-  Patient given Tdap vaccine today

## 2020-03-16 NOTE — Assessment & Plan Note (Addendum)
Patient with mixed hyperlipidemia, LDL elevated at 145.  ASCVD risk only 4%, A1c checked today normal at 5.6%. -Contact patient regarding starting low-dose statin

## 2020-03-24 ENCOUNTER — Telehealth: Payer: Self-pay

## 2020-03-24 NOTE — Telephone Encounter (Signed)
Patient LVM on nurse line requesting to speak with provider regarding labs from 2/7. Patient has concerns for elevated lipid panel and would like to know how to proceed.   Will forward to ordering provider.   Talbot Grumbling, RN

## 2020-03-25 NOTE — Telephone Encounter (Signed)
Patient returns call to nurse line regarding lab results.   Talbot Grumbling, RN

## 2020-03-29 ENCOUNTER — Encounter: Payer: Self-pay | Admitting: Hematology

## 2020-03-30 ENCOUNTER — Encounter: Payer: Self-pay | Admitting: Gastroenterology

## 2020-04-15 ENCOUNTER — Other Ambulatory Visit: Payer: Self-pay | Admitting: Hematology

## 2020-04-15 DIAGNOSIS — C50912 Malignant neoplasm of unspecified site of left female breast: Secondary | ICD-10-CM

## 2020-05-09 ENCOUNTER — Telehealth: Payer: Self-pay | Admitting: Hematology

## 2020-05-09 NOTE — Telephone Encounter (Signed)
LVM letting patient know nshe has been rescheduled to next available date 4/22- please call if time and date do not work.

## 2020-05-23 ENCOUNTER — Encounter: Payer: Self-pay | Admitting: Gastroenterology

## 2020-05-23 ENCOUNTER — Other Ambulatory Visit: Payer: BC Managed Care – PPO

## 2020-05-23 ENCOUNTER — Ambulatory Visit: Payer: BC Managed Care – PPO | Admitting: Hematology

## 2020-05-25 NOTE — Progress Notes (Signed)
[place]   [phone] [phone]   Clinic Follow up Note   Patient Care Team: McDiarmid, Blane Ohara, MD as PCP - General (Family Medicine) Stark Klein, MD as Consulting Physician (General Surgery) Truitt Merle, MD as Consulting Physician (Hematology) Arloa Koh, MD (Inactive) as Consulting Physician (Radiation Oncology) Rockwell Germany, RN as Registered Nurse Mauro Kaufmann, RN as Registered Nurse Jake Shark, Johny Blamer, NP as Nurse Practitioner (Nurse Practitioner)  Date of Service:  05/30/2020  CHIEF COMPLAINT: Follow up left breast cancer  SUMMARY OF ONCOLOGIC HISTORY: Oncology History Overview Note  Breast cancer of upper-outer quadrant of left female breast [facility])   Staging form: Breast, AJCC 7th Edition     Clinical stage from 11/10/2014: Stage IIA (T2, N0, M0) - Signed by Truitt Merle, MD on 11/10/2014     Pathologic stage from 11/18/2014: Stage IB (T1c, N58mi, cM0) - Signed by Enid Cutter, MD on 12/01/2014       Staging comments: Staged on lumpectomy specimen by Dr. Tresa Moore        Breast cancer of upper-outer quadrant of left female breast ([place])  10/28/2014 Mammogram   Screening and the diagnostic mammogram shows a 2.1 x 1.3 x 2.1 cm it irregular mass in the left breast 2:00 position, 2 cm from the nipple.   11/01/2014 Initial Biopsy   left breast biopsy showed invasive ductal carcinoma, grade 2, and DCIS    11/01/2014 Receptors her2   ER 100%, PR 100%, HER2/neu negative, KI67 60%    11/10/2014 Clinical Stage   Stage IIA: T2 N0   11/16/2014 Surgery   left breast lumpectomy with SLN biopsy.    11/16/2014 Pathology Results   left breast invasive ductal carcinoma, G3, 1 out of 3 SLN had micrometastasis, (-)LVI, margins negative,  (+) high grade DCIS   11/16/2014 Pathologic Stage   Stage IB: T1c N91mi   11/16/2014 Oncotype testing   RS 30, predicts 20% 10-y risk of distant recurrence with tamoxifen alone    12/15/2014 - 02/17/2015  Adjuvant Chemotherapy   Docetaxel 75 mg/m, Cytoxan 600 mg/m, every 3 weeks,  total 4 cycles.   03/21/2015 - 05/04/2015 Radiation Therapy   adjuvant breast radiation: Left breast/ 45 Gy at 1.8 Gy per fraction x 25 fractions.  Left breast boost/ 16 Gy at 2 Gy per fraction x 8 fractions   05/21/2015 -  Anti-estrogen oral therapy   Letrozole 2.5 mg daily   06/30/2015 Survivorship   Survivorship visit completed   11/26/2016 Mammogram   IMPRESSION: Stable left breast lumpectomy site. No mammographic evidence of malignancy in the bilateral breasts.      CURRENT THERAPY:  Letrozole 2.$RemoveBefo'5mg'bwCtXRXWyqw$  daily started in 05/2015  INTERVAL HISTORY:  Shirley Howard is here for a follow up of left breast cancer. She was last seen by me 6 months ago. She presents to the clinic with her husband. She notes she is doing well. I reviewed her medication list with her and no changes has been made. She is tolerating Letrozole well with manageable hot flashes and joint pain. She notes she has been working on losing weight in the last 2-3 months. She has altered her diet and started to work out more. She notes she last saw her PCP in January. She notes the breast center said she was not due for her mammogram in early 2022.  She has not had a mammogram this year yet.     REVIEW OF SYSTEMS:   Constitutional: Denies fevers,  chills or abnormal weight loss (+) Tolerable hot flashes  Eyes: Denies blurriness of vision Ears, nose, mouth, throat, and face: Denies mucositis or sore throat Respiratory: Denies cough, dyspnea or wheezes Cardiovascular: Denies palpitation, chest discomfort or lower extremity swelling Gastrointestinal:  Denies nausea, heartburn or change in bowel habits Skin: Denies abnormal skin rashes MSK: (+) Tolerable joint pain  Lymphatics: Denies new lymphadenopathy or easy bruising Neurological:Denies numbness, tingling or new weaknesses Behavioral/Psych: Mood is stable, no new changes  All other  systems were reviewed with the patient and are negative.  MEDICAL HISTORY:  Past Medical History:  Diagnosis Date  . Allergy   . Anxiety   . Arthritis   . Breast cancer ([place])   . Chemotherapy induced neutropenia ([place]) 12/29/2014  . Depression   . Ex-heavy cigarette smoker (20-39 per day) 09/27/2008   Smoked > 100 cigarettes in her life  Quit smoking 11/04/2012    . GERD (gastroesophageal reflux disease)   . History of colon polyps, Tubular Adenoma 03/08/2010  . Hot flashes   . Ingrown nail 09/01/2018  . Kidney stones   . Left breast mass 10/22/2014   Approx. 2 cm by 2 cm mobile hard mass of left breast at 3 oclock position   . Left wrist pain 04/28/2012  . Mucositis due to antineoplastic therapy 12/29/2014  . Mucositis due to chemotherapy 01/06/2015  . Personal history of chemotherapy    2016-2017  . Personal history of radiation therapy    2017  . Poor sleep pattern 11/24/2012  . Rash of hands 12/29/2014  . Sore throat 10/27/2012   10/31/12 [facility] (see scanned document) - vocal cord polyp left, bilateral edema, significant postcricoid edema, c/w chronic tobacco use and chronic reflux. - Recheck 2 months - Encouraged tobacco cessation to reduce risk of laryngeal cancer     . Tubular adenoma of colon 03/2010  . Vocal cord nodule 03/08/2014  . Weight gain 01/05/2013  . Wrist pain     SURGICAL HISTORY: Past Surgical History:  Procedure Laterality Date  . BREAST LUMPECTOMY Left 11/16/2014  . BREAST LUMPECTOMY WITH AXILLARY LYMPH NODE BIOPSY Left 11/16/2014   Procedure: LEFT BREAST LUMPECTOMY WITH AXILLARY LYMPH NODE BIOPSY;  Surgeon: Stark Klein, MD;  Location: [place];  Service: General;  Laterality: Left;  . COLONOSCOPY    . CYST REMOVAL WRIST Left   . tubal ligation  1995    I have reviewed the social history and family history with the patient and they are unchanged from previous note.  ALLERGIES:  is allergic to codeine.  MEDICATIONS:  Current  Outpatient Medications  Medication Sig Dispense Refill  . aspirin 81 MG tablet Take 81 mg by mouth daily.    . Biotin w/ Vitamins C & E (HAIR/SKIN/NAILS PO) Take 2 tablets by mouth daily.    . calcium carbonate (OS-CAL) 600 MG TABS tablet Take 600 mg by mouth daily with breakfast.    . diphenhydrAMINE (SOMINEX) 25 MG tablet Take 50 mg by mouth at bedtime as needed for sleep.     Marland Kitchen EVENING PRIMROSE OIL PO Take 1,300 mg by mouth daily.    . fish oil-omega-3 fatty acids 1000 MG capsule Take 2 g by mouth daily. Reported on 05/31/2015    . fluticasone (FLONASE) 50 MCG/ACT nasal spray Place 2 sprays into both nostrils daily. 16 g 0  . Inulin (PHILLIPS FIBER GOOD PO) Take by mouth.    . letrozole (FEMARA) 2.5 MG tablet TAKE 1 TABLET  BY MOUTH EVERY DAY 90 tablet 1  . Multiple Vitamin (MULTIVITAMIN) tablet Take 1 tablet by mouth daily.     No current facility-administered medications for this visit.    PHYSICAL EXAMINATION: ECOG PERFORMANCE STATUS: 0 - Asymptomatic  Vitals:   05/30/20 0841  BP: (!) 144/80  Pulse: (!) 51  Resp: 18  Temp: 97.6 F (36.4 C)  SpO2: 99%   Filed Weights   05/30/20 0841  Weight: 180 lb 8 oz (81.9 kg)    GENERAL:alert, no distress and comfortable SKIN: skin color, texture, turgor are normal, no rashes or significant lesions EYES: normal, Conjunctiva are pink and non-injected, sclera clear  NECK: supple, thyroid normal size, non-tender, without nodularity LYMPH:  no palpable lymphadenopathy in the cervical, axillary  LUNGS: clear to auscultation and percussion with normal breathing effort HEART: regular rate & rhythm and no murmurs and no lower extremity edema ABDOMEN:abdomen soft, non-tender and normal bowel sounds Musculoskeletal:no cyanosis of digits and no clubbing  NEURO: alert & oriented x 3 with fluent speech, no focal motor/sensory deficits BREAST: S/p Left lumpectomy: surgical incision healed well (+) 1x1.5cm nodule at left lateral incision, stable.  Right breast exam normal.   LABORATORY DATA:  I have reviewed the data as listed CBC Latest Ref Rng & Units 05/30/2020 11/23/2019 05/25/2019  WBC 4.0 - 10.5 K/uL 6.5 6.8 7.3  Hemoglobin 12.0 - 15.0 g/dL 13.6 13.8 13.2  Hematocrit 36.0 - 46.0 % 41.3 41.8 40.4  Platelets 150 - 400 K/uL 256 272 297     CMP Latest Ref Rng & Units 05/30/2020 11/23/2019 05/25/2019  Glucose 70 - 99 mg/dL 107(H) 107(H) 81  BUN 8 - 23 mg/dL $Remove'19 17 19  'vsQgaLF$ Creatinine 0.44 - 1.00 mg/dL 0.79 0.83 0.81  Sodium 135 - 145 mmol/L 141 137 139  Potassium 3.5 - 5.1 mmol/L 4.4 4.7 5.0  Chloride 98 - 111 mmol/L 103 102 104  CO2 22 - 32 mmol/L $RemoveB'26 30 28  'XcxXeoYO$ Calcium 8.9 - 10.3 mg/dL 9.8 10.1 9.8  Total Protein 6.5 - 8.1 g/dL 7.1 7.2 7.3  Total Bilirubin 0.3 - 1.2 mg/dL 0.5 0.4 0.4  Alkaline Phos 38 - 126 U/L 94 94 93  AST 15 - 41 U/L $Remo'23 21 23  'EuPEF$ ALT 0 - 44 U/L $Remo'21 23 25      'ULwXk$ RADIOGRAPHIC STUDIES: I have personally reviewed the radiological images as listed and agreed with the findings in the report. No results found.   ASSESSMENT & PLAN:  Tarri Guilfoil is a 60 y.o. female with    1. Left breast invasive ductal carcinoma, [id number], stage IB, grade 3, ER positive, PR positive, HER-2 negative, (+) DCIS, RS 30 -She was diagnosed in 10/2014. She is s/p left breastlumpectomywith RS 30, adjuvant chemo TC and adjuvant radiation.  -She started antiestrogentherapywith Letrozole in 05/2015. Toleratingwell with mild to moderate hot flash, mild arthralgia, no other significant side effects. -She is clinically doing well. Lab reviewed, her CBC and CMP are within normal limits. Her physical exam showed 1x1.5cm nodule in her left lateral breast incision is mostly stable, exam otherwise unremarkable. There is no clinical concern for recurrence. -Continue surveillance. Next mammogram in 12/2020. -Continue Letrozole, plan for 7 years. -F/u in 13 months   4. Osteopenia -Her 11/2015 DEXA showed osteopeniawith lowest T-score -1.2 at  right hip, no high risk for fracture. -01/2018 DEXA showed osteopenia with lowest t-score -1.1 at right hip, overall stable.Next in 12/2020. -She will continue calcium and vitamin D  5. Weight  gain  -She is a Administrator, sedentary, part of her weight gain is probably also related to letrozole -Since early 2022, she has been able to lose about 8 pounds by change in diet and increased exercise. I encouraged her to continue.    Plan -Continue Letrozole  -Mammogram and DEXA in 12/2020 -Lab and follow-up in 13 months, she will see PCP in 02/2021   No problem-specific Assessment & Plan notes found for this encounter.   Orders Placed This Encounter  Procedures  . MM Digital Screening    Standing Status:   Future    Standing Expiration Date:   05/30/2021    Order Specific Question:   Reason for Exam (SYMPTOM  OR DIAGNOSIS REQUIRED)    Answer:   screening    Order Specific Question:   Preferred imaging location?    Answer:   [facility]  . DG Bone Density    Standing Status:   Future    Standing Expiration Date:   05/30/2021    Order Specific Question:   Reason for Exam (SYMPTOM  OR DIAGNOSIS REQUIRED)    Answer:   screening    Order Specific Question:   Preferred imaging location?    Answer:   [facility]   All questions were answered. The patient knows to call the clinic with any problems, questions or concerns. No barriers to learning was detected. The total time spent in the appointment was 30 minutes.     Truitt Merle, MD 05/30/2020   I, Joslyn Devon, am acting as scribe for Truitt Merle, MD.   I have reviewed the above documentation for accuracy and completeness, and I agree with the above.

## 2020-05-27 ENCOUNTER — Ambulatory Visit: Payer: BC Managed Care – PPO | Admitting: Hematology

## 2020-05-27 ENCOUNTER — Other Ambulatory Visit: Payer: BC Managed Care – PPO

## 2020-05-30 ENCOUNTER — Inpatient Hospital Stay: Payer: BC Managed Care – PPO | Admitting: Hematology

## 2020-05-30 ENCOUNTER — Other Ambulatory Visit: Payer: Self-pay

## 2020-05-30 ENCOUNTER — Other Ambulatory Visit: Payer: Self-pay | Admitting: *Deleted

## 2020-05-30 ENCOUNTER — Encounter: Payer: Self-pay | Admitting: Hematology

## 2020-05-30 ENCOUNTER — Inpatient Hospital Stay: Payer: BC Managed Care – PPO | Attending: Hematology

## 2020-05-30 ENCOUNTER — Telehealth: Payer: Self-pay | Admitting: Hematology

## 2020-05-30 VITALS — BP 144/80 | HR 51 | Temp 97.6°F | Resp 18 | Ht 62.0 in | Wt 180.5 lb

## 2020-05-30 DIAGNOSIS — Z170 Estrogen receptor positive status [ER+]: Secondary | ICD-10-CM

## 2020-05-30 DIAGNOSIS — C50412 Malignant neoplasm of upper-outer quadrant of left female breast: Secondary | ICD-10-CM | POA: Diagnosis not present

## 2020-05-30 DIAGNOSIS — M129 Arthropathy, unspecified: Secondary | ICD-10-CM | POA: Diagnosis not present

## 2020-05-30 DIAGNOSIS — R232 Flushing: Secondary | ICD-10-CM | POA: Diagnosis not present

## 2020-05-30 DIAGNOSIS — Z86010 Personal history of colonic polyps: Secondary | ICD-10-CM | POA: Insufficient documentation

## 2020-05-30 DIAGNOSIS — Z79811 Long term (current) use of aromatase inhibitors: Secondary | ICD-10-CM | POA: Insufficient documentation

## 2020-05-30 DIAGNOSIS — M2550 Pain in unspecified joint: Secondary | ICD-10-CM | POA: Insufficient documentation

## 2020-05-30 DIAGNOSIS — K219 Gastro-esophageal reflux disease without esophagitis: Secondary | ICD-10-CM | POA: Diagnosis not present

## 2020-05-30 DIAGNOSIS — Z1231 Encounter for screening mammogram for malignant neoplasm of breast: Secondary | ICD-10-CM

## 2020-05-30 DIAGNOSIS — Z9221 Personal history of antineoplastic chemotherapy: Secondary | ICD-10-CM | POA: Diagnosis not present

## 2020-05-30 DIAGNOSIS — Z87891 Personal history of nicotine dependence: Secondary | ICD-10-CM | POA: Diagnosis not present

## 2020-05-30 DIAGNOSIS — Z87442 Personal history of urinary calculi: Secondary | ICD-10-CM | POA: Insufficient documentation

## 2020-05-30 DIAGNOSIS — Z923 Personal history of irradiation: Secondary | ICD-10-CM | POA: Insufficient documentation

## 2020-05-30 DIAGNOSIS — E2839 Other primary ovarian failure: Secondary | ICD-10-CM | POA: Diagnosis not present

## 2020-05-30 DIAGNOSIS — R634 Abnormal weight loss: Secondary | ICD-10-CM | POA: Insufficient documentation

## 2020-05-30 DIAGNOSIS — M85851 Other specified disorders of bone density and structure, right thigh: Secondary | ICD-10-CM | POA: Diagnosis not present

## 2020-05-30 DIAGNOSIS — Z7982 Long term (current) use of aspirin: Secondary | ICD-10-CM | POA: Insufficient documentation

## 2020-05-30 LAB — CBC WITH DIFFERENTIAL (CANCER CENTER ONLY)
Abs Immature Granulocytes: 0.02 10*3/uL (ref 0.00–0.07)
Basophils Absolute: 0.1 10*3/uL (ref 0.0–0.1)
Basophils Relative: 1 %
Eosinophils Absolute: 0.2 10*3/uL (ref 0.0–0.5)
Eosinophils Relative: 3 %
HCT: 41.3 % (ref 36.0–46.0)
Hemoglobin: 13.6 g/dL (ref 12.0–15.0)
Immature Granulocytes: 0 %
Lymphocytes Relative: 27 %
Lymphs Abs: 1.7 10*3/uL (ref 0.7–4.0)
MCH: 29.6 pg (ref 26.0–34.0)
MCHC: 32.9 g/dL (ref 30.0–36.0)
MCV: 90 fL (ref 80.0–100.0)
Monocytes Absolute: 0.4 10*3/uL (ref 0.1–1.0)
Monocytes Relative: 7 %
Neutro Abs: 4.1 10*3/uL (ref 1.7–7.7)
Neutrophils Relative %: 62 %
Platelet Count: 256 10*3/uL (ref 150–400)
RBC: 4.59 MIL/uL (ref 3.87–5.11)
RDW: 13.2 % (ref 11.5–15.5)
WBC Count: 6.5 10*3/uL (ref 4.0–10.5)
nRBC: 0 % (ref 0.0–0.2)

## 2020-05-30 LAB — CMP (CANCER CENTER ONLY)
ALT: 21 U/L (ref 0–44)
AST: 23 U/L (ref 15–41)
Albumin: 4.2 g/dL (ref 3.5–5.0)
Alkaline Phosphatase: 94 U/L (ref 38–126)
Anion gap: 12 (ref 5–15)
BUN: 19 mg/dL (ref 8–23)
CO2: 26 mmol/L (ref 22–32)
Calcium: 9.8 mg/dL (ref 8.9–10.3)
Chloride: 103 mmol/L (ref 98–111)
Creatinine: 0.79 mg/dL (ref 0.44–1.00)
GFR, Estimated: >60 mL/min (ref >60)
Glucose, Bld: 107 mg/dL — ABNORMAL HIGH (ref 70–99)
Potassium: 4.4 mmol/L (ref 3.5–5.1)
Sodium: 141 mmol/L (ref 135–145)
Total Bilirubin: 0.5 mg/dL (ref 0.3–1.2)
Total Protein: 7.1 g/dL (ref 6.5–8.1)

## 2020-05-30 NOTE — Telephone Encounter (Signed)
Scheduled follow-up appointment per 4/25 los. Patient is aware. ?

## 2020-06-06 ENCOUNTER — Encounter: Payer: BC Managed Care – PPO | Admitting: Gastroenterology

## 2020-08-01 ENCOUNTER — Ambulatory Visit (AMBULATORY_SURGERY_CENTER): Payer: Self-pay

## 2020-08-01 ENCOUNTER — Other Ambulatory Visit: Payer: Self-pay

## 2020-08-01 VITALS — Ht 62.0 in | Wt 180.0 lb

## 2020-08-01 DIAGNOSIS — Z86010 Personal history of colonic polyps: Secondary | ICD-10-CM

## 2020-08-01 MED ORDER — PEG-KCL-NACL-NASULF-NA ASC-C 100 G PO SOLR: 1.0000 | Freq: Once | ORAL | 0 refills | Status: AC

## 2020-08-01 NOTE — Progress Notes (Signed)
Patient's pre-visit was done today over the phone with the patient due to COVID-19 pandemic.   Name,DOB and address verified. Insurance verified.   Patient denies any allergies to Eggs and Soy. Patient denies any problems with anesthesia/sedation. Patient denies taking diet pills or blood thinners. Packet of Prep instructions mailed to patient including a copy of a consent form-pt is aware. Patient understands to call us back with any questions or concerns. Patient is aware of our care-partner policy and EMVVK-12 safety protocol.   EMMI education assigned to the patient for the procedure, sent to [place].   The patient is COVID-19 vaccinated, per patient.

## 2020-08-22 ENCOUNTER — Encounter: Payer: Self-pay | Admitting: Gastroenterology

## 2020-08-22 ENCOUNTER — Other Ambulatory Visit: Payer: Self-pay

## 2020-08-22 ENCOUNTER — Ambulatory Visit (AMBULATORY_SURGERY_CENTER): Payer: BC Managed Care – PPO | Admitting: Gastroenterology

## 2020-08-22 VITALS — BP 128/78 | HR 57 | Temp 98.0°F | Resp 18 | Ht 62.0 in | Wt 180.0 lb

## 2020-08-22 DIAGNOSIS — K635 Polyp of colon: Secondary | ICD-10-CM | POA: Diagnosis not present

## 2020-08-22 DIAGNOSIS — Z1211 Encounter for screening for malignant neoplasm of colon: Secondary | ICD-10-CM | POA: Diagnosis not present

## 2020-08-22 DIAGNOSIS — Z86010 Personal history of colonic polyps: Secondary | ICD-10-CM | POA: Diagnosis not present

## 2020-08-22 DIAGNOSIS — D125 Benign neoplasm of sigmoid colon: Secondary | ICD-10-CM

## 2020-08-22 MED ORDER — SODIUM CHLORIDE 0.9 % IV SOLN: 500.0000 mL | INTRAVENOUS | Status: DC

## 2020-08-22 NOTE — Progress Notes (Signed)
Pt's states no medical or surgical changes since previsit or office visit. 

## 2020-08-22 NOTE — Patient Instructions (Signed)
HANDOUT ON POLYPS GIVEN TO YOU TODAY  AWAIT PATHOLOGY RESULTS ON POLYP REMOVED    YOU HAD AN ENDOSCOPIC PROCEDURE TODAY AT [facility]:   Refer to the procedure report that was given to you for any specific questions about what was found during the examination.  If the procedure report does not answer your questions, please call your gastroenterologist to clarify.  If you requested that your care partner not be given the details of your procedure findings, then the procedure report has been included in a sealed envelope for you to review at your convenience later.  YOU SHOULD EXPECT: Some feelings of bloating in the abdomen. Passage of more gas than usual.  Walking can help get rid of the air that was put into your GI tract during the procedure and reduce the bloating. If you had a lower endoscopy (such as a colonoscopy or flexible sigmoidoscopy) you may notice spotting of blood in your stool or on the toilet paper. If you underwent a bowel prep for your procedure, you may not have a normal bowel movement for a few days.  Please Note:  You might notice some irritation and congestion in your nose or some drainage.  This is from the oxygen used during your procedure.  There is no need for concern and it should clear up in a day or so.  SYMPTOMS TO REPORT IMMEDIATELY:  Following lower endoscopy (colonoscopy or flexible sigmoidoscopy):  Excessive amounts of blood in the stool  Significant tenderness or worsening of abdominal pains  Swelling of the abdomen that is new, acute  Fever of 100F or higher   For urgent or emergent issues, a gastroenterologist can be reached at any hour by calling [phone]. Do not use MyChart messaging for urgent concerns.    DIET:  We do recommend a small meal at first, but then you may proceed to your regular diet.  Drink plenty of fluids but you should avoid alcoholic beverages for 24 hours.  ACTIVITY:  You should plan to take it easy for  the rest of today and you should NOT DRIVE or use heavy machinery until tomorrow (because of the sedation medicines used during the test).    FOLLOW UP: Our staff will call the number listed on your records 48-72 hours following your procedure to check on you and address any questions or concerns that you may have regarding the information given to you following your procedure. If we do not reach you, we will leave a message.  We will attempt to reach you two times.  During this call, we will ask if you have developed any symptoms of COVID 19. If you develop any symptoms (ie: fever, flu-like symptoms, shortness of breath, cough etc.) before then, please call [phone].  If you test positive for Covid 19 in the 2 weeks post procedure, please call and report this information to [place].    If any biopsies were taken you will be contacted by phone or by letter within the next 1-3 weeks.  Please call us at [phone] if you have not heard about the biopsies in 3 weeks.    SIGNATURES/CONFIDENTIALITY: You and/or your care partner have signed paperwork which will be entered into your electronic medical record.  These signatures attest to the fact that that the information above on your After Visit Summary has been reviewed and is understood.  Full responsibility of the confidentiality of this discharge information lies with you and/or your care-partner.

## 2020-08-22 NOTE — Progress Notes (Signed)
Report given to PACU, vss 

## 2020-08-22 NOTE — Op Note (Signed)
[facility] Patient Name: Shirley Howard Procedure Date: 08/22/2020 10:58 AM MRN: [id number] Endoscopist: Ladene Artist , MD Age: 60 Referring MD:  Date of Birth: 1960-04-25 Gender: Female Account #: [id number] Procedure:                Colonoscopy Indications:              Surveillance: Personal history of adenomatous                            polyps on last colonoscopy 5 years ago Medicines:                Monitored Anesthesia Care Procedure:                Pre-Anesthesia Assessment:                           - Prior to the procedure, a History and Physical                            was performed, and patient medications and                            allergies were reviewed. The patient's tolerance of                            previous anesthesia was also reviewed. The risks                            and benefits of the procedure and the sedation                            options and risks were discussed with the patient.                            All questions were answered, and informed consent                            was obtained. Prior Anticoagulants: The patient has                            taken no previous anticoagulant or antiplatelet                            agents. ASA Grade Assessment: II - A patient with                            mild systemic disease. After reviewing the risks                            and benefits, the patient was deemed in                            satisfactory condition to undergo the procedure.  After obtaining informed consent, the colonoscope                            was passed under direct vision. Throughout the                            procedure, the patient's blood pressure, pulse, and                            oxygen saturations were monitored continuously. The                            Colonoscope was introduced through the anus and                            advanced to the the  cecum, identified by                            appendiceal orifice and ileocecal valve. The                            ileocecal valve, appendiceal orifice, and rectum                            were photographed. The quality of the bowel                            preparation was excellent. The colonoscopy was                            performed without difficulty. The patient tolerated                            the procedure well. Scope In: 11:05:38 AM Scope Out: 11:24:56 AM Scope Withdrawal Time: 0 hours 14 minutes 32 seconds  Total Procedure Duration: 0 hours 19 minutes 18 seconds  Findings:                 The perianal and digital rectal examinations were                            normal.                           A 5 mm polyp was found in the sigmoid colon. The                            polyp was sessile. The polyp was removed with a                            cold snare. Resection and retrieval were complete.                           The exam was otherwise without abnormality on  direct and retroflexion views. Complications:            No immediate complications. Estimated blood loss:                            None. Estimated Blood Loss:     Estimated blood loss: none. Impression:               - One 5 mm polyp in the sigmoid colon, removed with                            a cold snare. Resected and retrieved.                           - The examination was otherwise normal on direct                            and retroflexion views. Recommendation:           - Repeat colonoscopy after studies are complete for                            surveillance after piecemeal polypectomy.                           - Patient has a contact number available for                            emergencies. The signs and symptoms of potential                            delayed complications were discussed with the                            patient. Return to normal  activities tomorrow.                            Written discharge instructions were provided to the                            patient.                           - Resume previous diet.                           - Continue present medications.                           - Await pathology results. Ladene Artist, MD 08/22/2020 11:27:51 AM This report has been signed electronically.

## 2020-08-22 NOTE — Progress Notes (Signed)
Called to room to assist during endoscopic procedure.  Patient ID and intended procedure confirmed with present staff. Received instructions for my participation in the procedure from the performing physician.  

## 2020-08-24 ENCOUNTER — Telehealth: Payer: Self-pay | Admitting: *Deleted

## 2020-08-24 NOTE — Telephone Encounter (Signed)
  Follow up Call-  Call back number 08/22/2020  Post procedure Call Back phone  # [phone]  Permission to leave phone message Yes  Some recent data might be hidden     Patient questions:  Do you have a fever, pain , or abdominal swelling? No. Pain Score  0 *  Have you tolerated food without any problems? Yes.    Have you been able to return to your normal activities? Yes.    Do you have any questions about your discharge instructions: Diet   No. Medications  No. Follow up visit  No.  Do you have questions or concerns about your Care? No.  Actions: * If pain score is 4 or above: No action needed, pain <4.

## 2020-09-02 ENCOUNTER — Encounter: Payer: Self-pay | Admitting: Gastroenterology

## 2020-11-04 ENCOUNTER — Other Ambulatory Visit: Payer: Self-pay | Admitting: Hematology

## 2020-11-04 DIAGNOSIS — C50912 Malignant neoplasm of unspecified site of left female breast: Secondary | ICD-10-CM

## 2021-02-13 ENCOUNTER — Ambulatory Visit
Admission: RE | Admit: 2021-02-13 | Discharge: 2021-02-13 | Disposition: A | Discharge status: Home / Self Care | Payer: BC Managed Care – PPO | Source: Ambulatory Visit | Attending: Hematology | Admitting: Hematology

## 2021-02-13 DIAGNOSIS — M8589 Other specified disorders of bone density and structure, multiple sites: Secondary | ICD-10-CM | POA: Diagnosis not present

## 2021-02-13 DIAGNOSIS — Z780 Asymptomatic menopausal state: Secondary | ICD-10-CM | POA: Diagnosis not present

## 2021-02-13 DIAGNOSIS — Z1231 Encounter for screening mammogram for malignant neoplasm of breast: Secondary | ICD-10-CM | POA: Diagnosis not present

## 2021-02-13 DIAGNOSIS — E2839 Other primary ovarian failure: Secondary | ICD-10-CM

## 2021-04-29 ENCOUNTER — Other Ambulatory Visit: Payer: Self-pay | Admitting: Hematology

## 2021-04-29 DIAGNOSIS — C50912 Malignant neoplasm of unspecified site of left female breast: Secondary | ICD-10-CM

## 2021-06-23 ENCOUNTER — Other Ambulatory Visit: Payer: Self-pay

## 2021-06-23 DIAGNOSIS — C50412 Malignant neoplasm of upper-outer quadrant of left female breast: Secondary | ICD-10-CM

## 2021-06-26 ENCOUNTER — Other Ambulatory Visit: Payer: Self-pay

## 2021-06-26 ENCOUNTER — Inpatient Hospital Stay: Payer: BC Managed Care – PPO | Attending: Hematology | Admitting: Hematology

## 2021-06-26 ENCOUNTER — Inpatient Hospital Stay: Payer: Self-pay

## 2021-06-26 ENCOUNTER — Encounter: Payer: Self-pay | Admitting: Hematology

## 2021-06-26 VITALS — BP 130/74 | HR 60 | Temp 98.3°F | Resp 17 | Ht 62.0 in | Wt 186.1 lb

## 2021-06-26 DIAGNOSIS — R635 Abnormal weight gain: Secondary | ICD-10-CM | POA: Diagnosis not present

## 2021-06-26 DIAGNOSIS — Z87891 Personal history of nicotine dependence: Secondary | ICD-10-CM | POA: Diagnosis not present

## 2021-06-26 DIAGNOSIS — C50412 Malignant neoplasm of upper-outer quadrant of left female breast: Secondary | ICD-10-CM

## 2021-06-26 DIAGNOSIS — Z170 Estrogen receptor positive status [ER+]: Secondary | ICD-10-CM | POA: Diagnosis not present

## 2021-06-26 DIAGNOSIS — M85851 Other specified disorders of bone density and structure, right thigh: Secondary | ICD-10-CM | POA: Insufficient documentation

## 2021-06-26 DIAGNOSIS — Z1231 Encounter for screening mammogram for malignant neoplasm of breast: Secondary | ICD-10-CM

## 2021-06-26 DIAGNOSIS — Z853 Personal history of malignant neoplasm of breast: Secondary | ICD-10-CM | POA: Diagnosis not present

## 2021-06-26 LAB — CMP (CANCER CENTER ONLY)
ALT: 23 U/L (ref 0–44)
AST: 22 U/L (ref 15–41)
Albumin: 4.4 g/dL (ref 3.5–5.0)
Alkaline Phosphatase: 88 U/L (ref 38–126)
Anion gap: 6 (ref 5–15)
BUN: 18 mg/dL (ref 8–23)
CO2: 28 mmol/L (ref 22–32)
Calcium: 9.8 mg/dL (ref 8.9–10.3)
Chloride: 102 mmol/L (ref 98–111)
Creatinine: 0.79 mg/dL (ref 0.44–1.00)
GFR, Estimated: >60 mL/min (ref >60)
Glucose, Bld: 116 mg/dL — ABNORMAL HIGH (ref 70–99)
Potassium: 4.2 mmol/L (ref 3.5–5.1)
Sodium: 136 mmol/L (ref 135–145)
Total Bilirubin: 0.5 mg/dL (ref 0.3–1.2)
Total Protein: 7.3 g/dL (ref 6.5–8.1)

## 2021-06-26 LAB — CBC WITH DIFFERENTIAL (CANCER CENTER ONLY)
Abs Immature Granulocytes: 0.03 10*3/uL (ref 0.00–0.07)
Basophils Absolute: 0 10*3/uL (ref 0.0–0.1)
Basophils Relative: 0 %
Eosinophils Absolute: 0.1 10*3/uL (ref 0.0–0.5)
Eosinophils Relative: 2 %
HCT: 41.2 % (ref 36.0–46.0)
Hemoglobin: 14.2 g/dL (ref 12.0–15.0)
Immature Granulocytes: 0 %
Lymphocytes Relative: 27 %
Lymphs Abs: 2 10*3/uL (ref 0.7–4.0)
MCH: 30.1 pg (ref 26.0–34.0)
MCHC: 34.5 g/dL (ref 30.0–36.0)
MCV: 87.3 fL (ref 80.0–100.0)
Monocytes Absolute: 0.5 10*3/uL (ref 0.1–1.0)
Monocytes Relative: 7 %
Neutro Abs: 4.6 10*3/uL (ref 1.7–7.7)
Neutrophils Relative %: 64 %
Platelet Count: 296 10*3/uL (ref 150–400)
RBC: 4.72 MIL/uL (ref 3.87–5.11)
RDW: 13.2 % (ref 11.5–15.5)
WBC Count: 7.4 10*3/uL (ref 4.0–10.5)
nRBC: 0 % (ref 0.0–0.2)

## 2021-06-26 NOTE — Progress Notes (Signed)
[place]   [phone] [phone]   Clinic Follow up Note   Patient Care Team: McDiarmid, Blane Ohara, MD as PCP - General (Family Medicine) Stark Klein, MD as Consulting Physician (General Surgery) Truitt Merle, MD as Consulting Physician (Hematology) Arloa Koh, MD (Inactive) as Consulting Physician (Radiation Oncology) Rockwell Germany, RN as Registered Nurse Mauro Kaufmann, RN as Registered Nurse Sylvan Cheese, NP as Nurse Practitioner (Nurse Practitioner)  Date of Service:  06/26/2021  CHIEF COMPLAINT: f/u of left breast cancer  CURRENT THERAPY:  Letrozole 2.$RemoveBefo'5mg'ZZkXLqZwasT$  daily started in 05/2015   ASSESSMENT & PLAN:  Shirley Howard is a 61 y.o. post-menopausal female with   1. Left breast invasive ductal carcinoma, [id number], stage IB, grade 3, ER positive, PR positive, HER-2 negative, (+) DCIS, RS 30 -She was diagnosed in 10/2014. She is s/p left breast lumpectomy with RS 30, adjuvant chemo TC and adjuvant radiation.  -She started antiestrogen therapy with Letrozole in 05/2015. Tolerating well with mild to moderate hot flash, mild arthralgia, no other significant side effects. -most recent mammogram 02/13/21 was negative. -She is clinically doing well. Lab reviewed, her CBC and CMP are within normal limits. Her physical exam was unremarkable. There is no clinical concern for recurrence. -Continue surveillance. Next mammogram in 02/2022. -Continue Letrozole, plan for 7 years (through 05/2022). -F/u in 1 year   2. Osteopenia -01/2018 DEXA showed osteopenia with lowest t-score -1.1 at right hip, overall mild and stable. Repeat on 02/13/21 was stable. -She will continue calcium and vitamin D   3. Weight gain  -She is a truck driver, sedentary, part of her weight gain is probably also related to letrozole     Plan -Continue Letrozole through 05/2022 -Mammogram in 02/2022 -Lab and follow-up in 1 year   No problem-specific Assessment &  Plan notes found for this encounter.   SUMMARY OF ONCOLOGIC HISTORY: Oncology History Overview Note  Breast cancer of upper-outer quadrant of left female breast [facility])   Staging form: Breast, AJCC 7th Edition     Clinical stage from 11/10/2014: Stage IIA (T2, N0, M0) - Signed by Truitt Merle, MD on 11/10/2014     Pathologic stage from 11/18/2014: Stage IB (T1c, N60mi, cM0) - Signed by Enid Cutter, MD on 12/01/2014       Staging comments: Staged on lumpectomy specimen by Dr. Tresa Moore         Breast cancer of upper-outer quadrant of left female breast ([place])  10/28/2014 Mammogram   Screening and the diagnostic mammogram shows a 2.1 x 1.3 x 2.1 cm it irregular mass in the left breast 2:00 position, 2 cm from the nipple.    11/01/2014 Initial Biopsy   left breast biopsy showed invasive ductal carcinoma, grade 2, and DCIS     11/01/2014 Receptors her2   ER 100%, PR 100%, HER2/neu negative, KI67 60%     11/10/2014 Clinical Stage   Stage IIA: T2 N0    11/16/2014 Surgery   left breast lumpectomy with SLN biopsy.     11/16/2014 Pathology Results   left breast invasive ductal carcinoma, G3, 1 out of 3 SLN had micrometastasis, (-)LVI, margins negative,  (+) high grade DCIS    11/16/2014 Pathologic Stage   Stage IB: T1c N23mi    11/16/2014 Oncotype testing   RS 30, predicts 20% 10-y risk of distant recurrence with tamoxifen alone     12/15/2014 - 02/17/2015 Adjuvant Chemotherapy   Docetaxel 75 mg/m, Cytoxan 600 mg/m, every 3  weeks,  total 4 cycles.    03/21/2015 - 05/04/2015 Radiation Therapy   adjuvant breast radiation: Left breast/ 45 Gy at 1.8 Gy per fraction x 25 fractions.   Left breast boost/ 16 Gy at 2 Gy per fraction x 8 fractions    05/21/2015 -  Anti-estrogen oral therapy   Letrozole 2.5 mg daily    06/30/2015 Survivorship   Survivorship visit completed    11/26/2016 Mammogram   IMPRESSION: Stable left breast lumpectomy site. No mammographic evidence of malignancy in the  bilateral breasts.       INTERVAL HISTORY:  Shirley Howard is here for a follow up of breast cancer. She was last seen by me on 05/30/20. She presents to the clinic alone. She reports hot flashes from the letrozole that have remained moderate to severe. She notes they will wake her up at night. Overall no change in her side effects.   All other systems were reviewed with the patient and are negative.  MEDICAL HISTORY:  Past Medical History:  Diagnosis Date   Allergy    Anxiety    Arthritis    Breast cancer ([place])    Chemotherapy induced neutropenia ([place]) 12/29/2014   Depression    Ex-heavy cigarette smoker (20-39 per day) 09/27/2008   Smoked > 100 cigarettes in her life  Quit smoking 11/04/2012     GERD (gastroesophageal reflux disease)    History of colon polyps, Tubular Adenoma 03/08/2010   Hot flashes    Ingrown nail 09/01/2018   Kidney stones    Left breast mass 10/22/2014   Approx. 2 cm by 2 cm mobile hard mass of left breast at 3 oclock position    Left wrist pain 04/28/2012   Mucositis due to antineoplastic therapy 12/29/2014   Mucositis due to chemotherapy 01/06/2015   Personal history of chemotherapy    2016-2017   Personal history of radiation therapy    2017   Poor sleep pattern 11/24/2012   Rash of hands 12/29/2014   Sore throat 10/27/2012   10/31/12 [facility] ENT (see scanned document) - vocal cord polyp left, bilateral edema, significant postcricoid edema, c/w chronic tobacco use and chronic reflux. - Recheck 2 months - Encouraged tobacco cessation to reduce risk of laryngeal cancer      Tubular adenoma of colon 03/2010   Vocal cord nodule 03/08/2014   Weight gain 01/05/2013   Wrist pain     SURGICAL HISTORY: Past Surgical History:  Procedure Laterality Date   BREAST LUMPECTOMY Left 11/16/2014   BREAST LUMPECTOMY WITH AXILLARY LYMPH NODE BIOPSY Left 11/16/2014   Procedure: LEFT BREAST LUMPECTOMY WITH AXILLARY LYMPH NODE BIOPSY;  Surgeon: Stark Klein, MD;   Location: [facility];  Service: General;  Laterality: Left;   COLONOSCOPY     CYST REMOVAL WRIST Left    tubal ligation  1995    I have reviewed the social history and family history with the patient and they are unchanged from previous note.  ALLERGIES:  is allergic to codeine.  MEDICATIONS:  Current Outpatient Medications  Medication Sig Dispense Refill   aspirin 81 MG tablet Take 81 mg by mouth daily. (Patient not taking: Reported on 08/22/2020)     Biotin w/ Vitamins C & E (HAIR/SKIN/NAILS PO) Take 2 tablets by mouth daily.     calcium carbonate (OS-CAL) 600 MG TABS tablet Take 600 mg by mouth daily with breakfast.     diphenhydrAMINE (SOMINEX) 25 MG tablet Take 50 mg by mouth at bedtime  as needed for sleep.      EVENING PRIMROSE OIL PO Take 1,300 mg by mouth daily.     fish oil-omega-3 fatty acids 1000 MG capsule Take 2 g by mouth daily. Reported on 05/31/2015     fluticasone (FLONASE) 50 MCG/ACT nasal spray Place 2 sprays into both nostrils daily. 16 g 0   Inulin (PHILLIPS FIBER GOOD PO) Take by mouth.     letrozole (FEMARA) 2.5 MG tablet TAKE 1 TABLET BY MOUTH EVERY DAY 90 tablet 1   Multiple Vitamin (MULTIVITAMIN) tablet Take 1 tablet by mouth daily.     No current facility-administered medications for this visit.    PHYSICAL EXAMINATION: ECOG PERFORMANCE STATUS: 0 - Asymptomatic  Vitals:   06/26/21 1010  BP: 130/74  Pulse: 60  Resp: 17  Temp: 98.3 F (36.8 C)  SpO2: 98%   Wt Readings from Last 3 Encounters:  06/26/21 186 lb 1.6 oz (84.4 kg)  08/22/20 180 lb (81.6 kg)  08/01/20 180 lb (81.6 kg)     GENERAL:alert, no distress and comfortable SKIN: skin color, texture, turgor are normal, no rashes or significant lesions EYES: normal, Conjunctiva are pink and non-injected, sclera clear  NECK: supple, thyroid normal size, non-tender, without nodularity LYMPH:  no palpable lymphadenopathy in the cervical, axillary LUNGS: clear to auscultation and  percussion with normal breathing effort HEART: regular rate & rhythm and no murmurs and no lower extremity edema ABDOMEN:abdomen soft, non-tender and normal bowel sounds Musculoskeletal:no cyanosis of digits and no clubbing  NEURO: alert & oriented x 3 with fluent speech, no focal motor/sensory deficits BREAST: No palpable mass, nodules or adenopathy bilaterally. Breast exam benign.   LABORATORY DATA:  I have reviewed the data as listed    Latest Ref Rng & Units 06/26/2021    9:43 AM 05/30/2020    8:22 AM 11/23/2019    8:02 AM  CBC  WBC 4.0 - 10.5 K/uL 7.4   6.5   6.8    Hemoglobin 12.0 - 15.0 g/dL 14.2   13.6   13.8    Hematocrit 36.0 - 46.0 % 41.2   41.3   41.8    Platelets 150 - 400 K/uL 296   256   272          Latest Ref Rng & Units 06/26/2021    9:43 AM 05/30/2020    8:22 AM 11/23/2019    8:02 AM  CMP  Glucose 70 - 99 mg/dL 116   107   107    BUN 8 - 23 mg/dL $Remove'18   19   17    'yIdHobw$ Creatinine 0.44 - 1.00 mg/dL 0.79   0.79   0.83    Sodium 135 - 145 mmol/L 136   141   137    Potassium 3.5 - 5.1 mmol/L 4.2   4.4   4.7    Chloride 98 - 111 mmol/L 102   103   102    CO2 22 - 32 mmol/L $RemoveB'28   26   30    'lOZZieoQ$ Calcium 8.9 - 10.3 mg/dL 9.8   9.8   10.1    Total Protein 6.5 - 8.1 g/dL 7.3   7.1   7.2    Total Bilirubin 0.3 - 1.2 mg/dL 0.5   0.5   0.4    Alkaline Phos 38 - 126 U/L 88   94   94    AST 15 - 41 U/L 22   23   21  ALT 0 - 44 U/L $Remo'23   21   23        'yosAT$ RADIOGRAPHIC STUDIES: I have personally reviewed the radiological images as listed and agreed with the findings in the report. No results found.    Orders Placed This Encounter  Procedures   MM Digital Screening    Standing Status:   Future    Standing Expiration Date:   06/26/2022    Order Specific Question:   Reason for Exam (SYMPTOM  OR DIAGNOSIS REQUIRED)    Answer:   screening    Order Specific Question:   Preferred imaging location?    Answer:   [facility]   All questions were answered. The patient knows to  call the clinic with any problems, questions or concerns. No barriers to learning was detected. The total time spent in the appointment was 25 minutes.     Truitt Merle, MD 06/26/2021   I, Wilburn Mylar, am acting as scribe for Truitt Merle, MD.   I have reviewed the above documentation for accuracy and completeness, and I agree with the above.

## 2021-07-11 ENCOUNTER — Encounter: Payer: Self-pay | Admitting: *Deleted

## 2021-10-24 ENCOUNTER — Other Ambulatory Visit: Payer: Self-pay | Admitting: Hematology

## 2021-10-24 DIAGNOSIS — C50912 Malignant neoplasm of unspecified site of left female breast: Secondary | ICD-10-CM

## 2021-11-20 ENCOUNTER — Other Ambulatory Visit (HOSPITAL_COMMUNITY)
Admission: RE | Admit: 2021-11-20 | Discharge: 2021-11-20 | Disposition: A | Discharge status: Home / Self Care | Payer: BC Managed Care – PPO | Source: Ambulatory Visit | Attending: Family Medicine | Admitting: Family Medicine

## 2021-11-20 ENCOUNTER — Ambulatory Visit (INDEPENDENT_AMBULATORY_CARE_PROVIDER_SITE_OTHER): Payer: BC Managed Care – PPO | Admitting: Student

## 2021-11-20 ENCOUNTER — Encounter: Payer: Self-pay | Admitting: Student

## 2021-11-20 VITALS — BP 121/67 | HR 57 | Ht 62.0 in | Wt 181.0 lb

## 2021-11-20 DIAGNOSIS — N9089 Other specified noninflammatory disorders of vulva and perineum: Secondary | ICD-10-CM

## 2021-11-20 DIAGNOSIS — Z0000 Encounter for general adult medical examination without abnormal findings: Secondary | ICD-10-CM

## 2021-11-20 DIAGNOSIS — R739 Hyperglycemia, unspecified: Secondary | ICD-10-CM | POA: Diagnosis not present

## 2021-11-20 DIAGNOSIS — Z87891 Personal history of nicotine dependence: Secondary | ICD-10-CM

## 2021-11-20 DIAGNOSIS — E782 Mixed hyperlipidemia: Secondary | ICD-10-CM | POA: Diagnosis not present

## 2021-11-20 DIAGNOSIS — Z124 Encounter for screening for malignant neoplasm of cervix: Secondary | ICD-10-CM | POA: Insufficient documentation

## 2021-11-20 DIAGNOSIS — Z780 Asymptomatic menopausal state: Secondary | ICD-10-CM

## 2021-11-20 DIAGNOSIS — M8580 Other specified disorders of bone density and structure, unspecified site: Secondary | ICD-10-CM

## 2021-11-20 LAB — POCT GLYCOSYLATED HEMOGLOBIN (HGB A1C): Hemoglobin A1C: 5.5 % (ref 4.0–5.6)

## 2021-11-20 NOTE — Assessment & Plan Note (Signed)
Stable at last scan in January 2023.

## 2021-11-20 NOTE — Assessment & Plan Note (Signed)
Due for low-dose lung CT for lung cancer screening.  Not covered by insurance currently.  Will get when she transitions to Medicare at the age of 80.

## 2021-11-20 NOTE — Assessment & Plan Note (Signed)
Likely related to letrozole (antiestrogenic), advised continued use of Vagisil cream or may try KY water-based lubricant

## 2021-11-20 NOTE — Assessment & Plan Note (Signed)
Pap completed, doing well with new exercise regimen.  Will obtain out flu and COVID-vaccine next week, has received Shingrix per patient and advised her to get results sent to [place] from pharmacy.  Pap today, otherwise up-to-date on all screening recommendations except lung cancer screening.

## 2021-11-20 NOTE — Assessment & Plan Note (Signed)
Elevated at last check.  We will recheck today, consider starting statin.  Discussed this with patient and she is amenable if still elevated.

## 2021-11-20 NOTE — Patient Instructions (Signed)
It was great to see you today! Thank you for choosing [facility] for your primary care. Shirley Howard was seen for physical and Pap.  Today we addressed: I am unable to identify where you got the Shingrix vaccine.  If you know which pharmacy you got it out, I would appreciate if you could get them to fax the results to [place]. We did a Pap today with general STD testing. I am checking your cholesterol.  If this is high, I would recommend starting a cholesterol medication. For your vulvar itching, this is likely related to the letrozole that you are on.  I would recommend using a water-based K-Y jelly which may assist this.  Unfortunately, lack of estrogen is what may be precipitating this which is caused by letrozole. We will screen you for lung cancer once you are on Medicare and it will not be as expensive.  Things to do to Keep yourself Healthy - Exercise at least 30-45 minutes a day, 3-4 days a week. >150 min of moderate intensity per week is advised. - Eat a low-fat diet with lots of fruits and vegetables, up to 7-9 servings per day. - Seatbelts can save your life. Wear them always. - Smoke detectors on every level of your home, check batteries every year. - Eye Doctor - have an eye exam every 1-2 years - Safe sex - if you may be exposed to STDs, use a condom. - Alcohol If you drink, do it moderately, less than 1 drink per day. - [place].  Choose someone to speak for you if you are not able. - Depression is common in our stressful world.If you're feeling down or losing interest in things you normally enjoy, please come in for a visit. - Violence - If anyone is threatening or hurting you, please call immediately.   If you haven't already, sign up for My Chart to have easy access to your labs results, and communication with your primary care physician.  We are checking some labs today. If they are abnormal, I will call you. If they are normal, I will send  you a MyChart message (if it is active) or a letter in the mail. If you do not hear about your labs in the next 2 weeks, please call the office. Call the clinic at [phone] if your symptoms worsen or you have any concerns.  You should return to our clinic Return in about 1 year (around 11/21/2022) for Annual physical. Please arrive 15 minutes before your appointment to ensure smooth check in process.  We appreciate your efforts in making this happen.  Thank you for allowing me to participate in your care, [name], DO 11/20/2021, 9:51 AM PGY-2, [place]

## 2021-11-20 NOTE — Progress Notes (Signed)
SUBJECTIVE:   Chief compliant/HPI: annual examination  Shirley Howard is a 61 y.o. who presents today for an annual exam.   Concerned of vulvar itching, states she is on letrozole and is unsure if that would impact this.  She does use Vagisil cream which helps.  She just started going to the gym with her husband and enjoys the water fitness classes.   History tabs reviewed and updated.   OBJECTIVE:  BP 121/67   Pulse (!) 57   Ht '5\' 2"'$  (1.575 m)   Wt 181 lb (82.1 kg)   SpO2 96%   BMI 33.11 kg/m   Physical Exam Constitutional:      Appearance: Normal appearance.  Cardiovascular:     Rate and Rhythm: Normal rate and regular rhythm.     Heart sounds: Normal heart sounds.  Pulmonary:     Effort: Pulmonary effort is normal.     Breath sounds: Normal breath sounds.  Abdominal:     General: Bowel sounds are normal.     Palpations: Abdomen is soft.     Tenderness: There is no abdominal tenderness.  Neurological:     Mental Status: She is alert.  Psychiatric:        Mood and Affect: Mood normal.        Behavior: Behavior normal.   Pelvic exam: normal external genitalia, vulva, vagina, cervix, uterus and adnexa, VULVA: normal appearing vulva with no masses, tenderness or lesions, VAGINA: normal appearing vagina with normal color and discharge, no lesions, atrophic, CERVIX: normal appearing cervix without discharge or lesions.  ASSESSMENT/PLAN:  Encounter for wellness examination in adult Assessment & Plan: Pap completed, doing well with new exercise regimen.  Will obtain out flu and COVID-vaccine next week, has received Shingrix per patient and advised her to get results sent to [place] from pharmacy.  Pap today, otherwise up-to-date on all screening recommendations except lung cancer screening.   Vulvar irritation Assessment & Plan: Likely related to letrozole (antiestrogenic), advised continued use of Vagisil cream or may try KY water-based lubricant   Mixed  hyperlipidemia Assessment & Plan: Elevated at last check.  We will recheck today, consider starting statin.  Discussed this with patient and she is amenable if still elevated.  Orders: -     Lipid panel  Routine cervical smear -     Cytology - PAP  Hyperglycemia -     POCT glycosylated hemoglobin (Hb A1C)  History of tobacco use Assessment & Plan: Due for low-dose lung CT for lung cancer screening.  Not covered by insurance currently.  Will get when she transitions to Medicare at the age of 9.   Osteopenia after menopause Assessment & Plan: Stable at last scan in January 2023.   Annual Examination  See AVS for age appropriate recommendations  PHQ score 0, reviewed and discussed.  BP reviewed and at goal.  Asked about intimate partner violence and resources given as appropriate. Advance directives already in place.  Considered the following items based upon USPSTF recommendations: Diabetes screening: ordered Screening for elevated cholesterol: ordered HIV testing: discussed Hepatitis C: discussed Hepatitis B: discussed Syphilis if at high risk: discussed GC/CT  ordered  Cervical cancer screening: due for Pap today, cytology + HPV ordered Breast cancer screening: Currently on letrozole treatment for breast cancer, to be completed in a few months Colorectal cancer screening: up to date on screening for CRC. Lung cancer screening: discussed.   Return in about 1 year (around 11/21/2022) for Annual physical.  [name], [place]

## 2021-11-21 LAB — LIPID PANEL
Chol/HDL Ratio: 4.7 ratio — ABNORMAL HIGH (ref 0.0–4.4)
Cholesterol, Total: 206 mg/dL — ABNORMAL HIGH (ref 100–199)
HDL: 44 mg/dL (ref >39)
LDL Chol Calc (NIH): 125 mg/dL — ABNORMAL HIGH (ref 0–99)
Triglycerides: 212 mg/dL — ABNORMAL HIGH (ref 0–149)
VLDL Cholesterol Cal: 37 mg/dL (ref 5–40)

## 2021-11-22 LAB — CYTOLOGY - PAP
Chlamydia: NEGATIVE
Comment: NEGATIVE
Comment: NEGATIVE
Comment: NEGATIVE
Comment: NORMAL
Diagnosis: UNDETERMINED — AB
High risk HPV: NEGATIVE
Neisseria Gonorrhea: NEGATIVE
Trichomonas: NEGATIVE

## 2021-11-23 ENCOUNTER — Encounter: Payer: Self-pay | Admitting: Student

## 2021-11-24 ENCOUNTER — Other Ambulatory Visit: Payer: Self-pay | Admitting: Student

## 2021-11-24 DIAGNOSIS — E782 Mixed hyperlipidemia: Secondary | ICD-10-CM

## 2021-11-24 MED ORDER — ATORVASTATIN CALCIUM 40 MG PO TABS: 40.0000 mg | ORAL_TABLET | Freq: Every day | ORAL | 3 refills | Status: DC

## 2022-01-15 ENCOUNTER — Other Ambulatory Visit: Payer: Self-pay | Admitting: Hematology

## 2022-01-15 DIAGNOSIS — C50912 Malignant neoplasm of unspecified site of left female breast: Secondary | ICD-10-CM

## 2022-02-12 DIAGNOSIS — D225 Melanocytic nevi of trunk: Secondary | ICD-10-CM | POA: Diagnosis not present

## 2022-02-12 DIAGNOSIS — D2261 Melanocytic nevi of right upper limb, including shoulder: Secondary | ICD-10-CM | POA: Diagnosis not present

## 2022-02-12 DIAGNOSIS — D2262 Melanocytic nevi of left upper limb, including shoulder: Secondary | ICD-10-CM | POA: Diagnosis not present

## 2022-02-12 DIAGNOSIS — D1801 Hemangioma of skin and subcutaneous tissue: Secondary | ICD-10-CM | POA: Diagnosis not present

## 2022-02-12 DIAGNOSIS — L821 Other seborrheic keratosis: Secondary | ICD-10-CM | POA: Diagnosis not present

## 2022-02-12 DIAGNOSIS — L820 Inflamed seborrheic keratosis: Secondary | ICD-10-CM | POA: Diagnosis not present

## 2022-02-13 ENCOUNTER — Other Ambulatory Visit: Payer: Self-pay | Admitting: Hematology

## 2022-02-13 DIAGNOSIS — C50912 Malignant neoplasm of unspecified site of left female breast: Secondary | ICD-10-CM

## 2022-02-20 ENCOUNTER — Ambulatory Visit
Admission: RE | Admit: 2022-02-20 | Discharge: 2022-02-20 | Disposition: A | Discharge status: Home / Self Care | Payer: BC Managed Care – PPO | Source: Ambulatory Visit | Attending: Hematology | Admitting: Hematology

## 2022-02-20 DIAGNOSIS — Z1231 Encounter for screening mammogram for malignant neoplasm of breast: Secondary | ICD-10-CM

## 2022-05-07 ENCOUNTER — Ambulatory Visit
Admission: RE | Admit: 2022-05-07 | Discharge: 2022-05-07 | Disposition: A | Discharge status: Home / Self Care | Payer: BC Managed Care – PPO | Source: Ambulatory Visit | Attending: Family Medicine | Admitting: Family Medicine

## 2022-05-07 ENCOUNTER — Ambulatory Visit: Payer: BC Managed Care – PPO | Admitting: Family Medicine

## 2022-05-07 ENCOUNTER — Ambulatory Visit: Payer: BC Managed Care – PPO | Admitting: Student

## 2022-05-07 VITALS — BP 138/70 | HR 65 | Wt 185.0 lb

## 2022-05-07 DIAGNOSIS — M25532 Pain in left wrist: Secondary | ICD-10-CM

## 2022-05-07 DIAGNOSIS — M25531 Pain in right wrist: Secondary | ICD-10-CM | POA: Diagnosis not present

## 2022-05-07 DIAGNOSIS — M19032 Primary osteoarthritis, left wrist: Secondary | ICD-10-CM | POA: Diagnosis not present

## 2022-05-07 DIAGNOSIS — M25539 Pain in unspecified wrist: Secondary | ICD-10-CM | POA: Insufficient documentation

## 2022-05-07 DIAGNOSIS — M19031 Primary osteoarthritis, right wrist: Secondary | ICD-10-CM | POA: Diagnosis not present

## 2022-05-07 NOTE — Assessment & Plan Note (Addendum)
Bilateral, right> left, concern for overuse injury versus CMC osteoarthritis versus tenosynovitis versus rheumatoid arthritis.  Tenosynovitis is a leading cause on differential diagnosis given history of tenosynovitis and positive Finkelstein's however will still investigate with XR.  Advised conservative management with ibuprofen in the meantime.

## 2022-05-07 NOTE — Progress Notes (Signed)
  SUBJECTIVE:   CHIEF COMPLAINT / HPI:   Wrist/hand pain: R>L, 3-4 months old, the right side pain has been getting worse. Defines pain as not constant, widening movements of the hand make it worse. Wrist motions do not exacerbate pain. Aching at night but not necessarily pain. Tylenol does not help. Defines any traumas. Thumb extension bothers her. Feels as if her right wrist is swollen and has nodule.  Denies fevers or rashes.  No family history of rheumatoid arthritis.  PERTINENT  PMH / PSH: Tenosynovitis  Patient Care Team: McDiarmid, Blane Ohara, MD as PCP - General (Family Medicine) Stark Klein, MD as Consulting Physician (General Surgery) Truitt Merle, MD as Consulting Physician (Hematology) Arloa Koh, MD (Inactive) as Consulting Physician (Radiation Oncology) Rockwell Germany, RN as Registered Nurse Mauro Kaufmann, RN as Registered Nurse Sylvan Cheese, NP as Nurse Practitioner (Nurse Practitioner) OBJECTIVE:  BP 138/70   Pulse 65   Wt 185 lb (83.9 kg)   SpO2 98%   BMI 33.84 kg/m  Wrist, bilateral: TTP noted at the [facility] joint bilaterally. Inspection yielded no erythema, ecchymosis, bony deformity, or swelling. ROM full with good flexion and extension and ulnar/radial deviation that is symmetrical with opposite wrist. Palpation is normal over metacarpals, scaphoid, lunate, and TFCC; tendons without tenderness/swelling. Strength 5/5 in all directions without pain. Provocative testing demonstrates Phalen's, and Tinel's test, positive Finkelstein's.   ASSESSMENT/PLAN:  Pain in both wrists Assessment & Plan: Bilateral, right> left, concern for overuse injury versus CMC osteoarthritis versus tenosynovitis versus rheumatoid arthritis.  Tenosynovitis is a leading cause on differential diagnosis given history of tenosynovitis and positive Finkelstein's however will still investigate with XR.  Advised conservative management with ibuprofen in the meantime.  Orders: -     DG  Wrist Complete Right; Future -     DG Wrist Complete Left; Future  Return if symptoms worsen or fail to improve. [name], DO 05/07/2022, 10:21 AM PGY-2, [place]

## 2022-05-07 NOTE — Patient Instructions (Signed)
It was great to see you today! Thank you for choosing [facility] for your primary care. Shirley Howard was seen for wrist pain.  Today we addressed: I am not greatly concerned for inflammatory process, this could be a reactivation of tenosynovitis which you have had in the past or newly presenting osteoarthritis.  Will start with x-rays and go from there. Ibuprofen 600 mg every 8 hours as needed would be more beneficial than the Tylenol you are taking.  I have placed an order for a stat x-ray.  Please go to [place] [facility] or at [facility] to have this completed.  You do not need an appointment, but if you would like to call them beforehand, their number is [phone].  We will contact you with your results afterwards.   If you haven't already, sign up for My Chart to have easy access to your labs results, and communication with your primary care physician.  You should return to our clinic Return if symptoms worsen or fail to improve. Please arrive 15 minutes before your appointment to ensure smooth check in process.  We appreciate your efforts in making this happen.  Thank you for allowing me to participate in your care, [name], DO 05/07/2022, 9:47 AM PGY-2, [place]

## 2022-05-09 ENCOUNTER — Encounter: Payer: Self-pay | Admitting: Student

## 2022-05-09 DIAGNOSIS — M180 Bilateral primary osteoarthritis of first carpometacarpal joints: Secondary | ICD-10-CM | POA: Insufficient documentation

## 2022-05-09 HISTORY — DX: Bilateral primary osteoarthritis of first carpometacarpal joints: M18.0

## 2022-05-14 ENCOUNTER — Other Ambulatory Visit: Payer: Self-pay | Admitting: Student

## 2022-05-14 DIAGNOSIS — M180 Bilateral primary osteoarthritis of first carpometacarpal joints: Secondary | ICD-10-CM

## 2022-05-16 ENCOUNTER — Emergency Department (HOSPITAL_COMMUNITY): Payer: Worker's Compensation

## 2022-05-16 ENCOUNTER — Encounter (HOSPITAL_COMMUNITY): Payer: Self-pay

## 2022-05-16 ENCOUNTER — Other Ambulatory Visit: Payer: Self-pay

## 2022-05-16 ENCOUNTER — Emergency Department (HOSPITAL_COMMUNITY)
Admission: EM | Admit: 2022-05-16 | Discharge: 2022-05-17 | Discharge status: Against Medical Advice | Payer: Worker's Compensation | Attending: Emergency Medicine | Admitting: Emergency Medicine

## 2022-05-16 DIAGNOSIS — Z5329 Procedure and treatment not carried out because of patient's decision for other reasons: Secondary | ICD-10-CM | POA: Diagnosis not present

## 2022-05-16 DIAGNOSIS — H9319 Tinnitus, unspecified ear: Secondary | ICD-10-CM | POA: Insufficient documentation

## 2022-05-16 DIAGNOSIS — H9311 Tinnitus, right ear: Secondary | ICD-10-CM | POA: Diagnosis not present

## 2022-05-16 DIAGNOSIS — S0990XA Unspecified injury of head, initial encounter: Secondary | ICD-10-CM | POA: Diagnosis not present

## 2022-05-16 LAB — BASIC METABOLIC PANEL
Anion gap: 11 (ref 5–15)
BUN: 18 mg/dL (ref 8–23)
CO2: 25 mmol/L (ref 22–32)
Calcium: 9.2 mg/dL (ref 8.9–10.3)
Chloride: 102 mmol/L (ref 98–111)
Creatinine, Ser: 0.73 mg/dL (ref 0.44–1.00)
GFR, Estimated: >60 mL/min (ref >60)
Glucose, Bld: 88 mg/dL (ref 70–99)
Potassium: 3.9 mmol/L (ref 3.5–5.1)
Sodium: 138 mmol/L (ref 135–145)

## 2022-05-16 LAB — CBC WITH DIFFERENTIAL/PLATELET
Abs Immature Granulocytes: 0.03 10*3/uL (ref 0.00–0.07)
Basophils Absolute: 0.1 10*3/uL (ref 0.0–0.1)
Basophils Relative: 1 %
Eosinophils Absolute: 0.3 10*3/uL (ref 0.0–0.5)
Eosinophils Relative: 3 %
HCT: 41 % (ref 36.0–46.0)
Hemoglobin: 13.4 g/dL (ref 12.0–15.0)
Immature Granulocytes: 0 %
Lymphocytes Relative: 25 %
Lymphs Abs: 2.3 10*3/uL (ref 0.7–4.0)
MCH: 29.3 pg (ref 26.0–34.0)
MCHC: 32.7 g/dL (ref 30.0–36.0)
MCV: 89.7 fL (ref 80.0–100.0)
Monocytes Absolute: 0.7 10*3/uL (ref 0.1–1.0)
Monocytes Relative: 8 %
Neutro Abs: 5.7 10*3/uL (ref 1.7–7.7)
Neutrophils Relative %: 63 %
Platelets: 272 10*3/uL (ref 150–400)
RBC: 4.57 MIL/uL (ref 3.87–5.11)
RDW: 13.3 % (ref 11.5–15.5)
WBC: 9 10*3/uL (ref 4.0–10.5)
nRBC: 0 % (ref 0.0–0.2)
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
## 2022-05-17 NOTE — ED Provider Notes (Signed)
Pt not in room when I arrived. Had left ama per RN   Gailen Shelter, [place] 05/17/22 Lynnell Catalan, April, MD 05/17/22 [id number]

## 2022-05-18 ENCOUNTER — Telehealth: Payer: Self-pay | Admitting: Hematology

## 2022-05-18 NOTE — Telephone Encounter (Signed)
Contacted patient to scheduled appointments. Left message with appointment details and a call back number if patient had any questions or could not accommodate the time we provided.   

## 2022-06-14 ENCOUNTER — Other Ambulatory Visit: Payer: Self-pay | Admitting: Student

## 2022-06-14 ENCOUNTER — Ambulatory Visit: Payer: BC Managed Care – PPO | Attending: Family Medicine

## 2022-06-14 ENCOUNTER — Ambulatory Visit: Payer: BC Managed Care – PPO | Admitting: Student

## 2022-06-14 VITALS — BP 168/88 | HR 65 | Ht 61.0 in | Wt 187.4 lb

## 2022-06-14 DIAGNOSIS — I10 Essential (primary) hypertension: Secondary | ICD-10-CM

## 2022-06-14 DIAGNOSIS — I639 Cerebral infarction, unspecified: Secondary | ICD-10-CM | POA: Diagnosis not present

## 2022-06-14 DIAGNOSIS — I4891 Unspecified atrial fibrillation: Secondary | ICD-10-CM

## 2022-06-14 MED ORDER — AMLODIPINE-OLMESARTAN 5-20 MG PO TABS: 1.0000 | ORAL_TABLET | Freq: Every day | ORAL | 0 refills | Status: DC

## 2022-06-14 MED ORDER — ASPIRIN 81 MG PO TBEC: 81.0000 mg | DELAYED_RELEASE_TABLET | Freq: Every day | ORAL | 12 refills | Status: AC

## 2022-06-14 NOTE — Patient Instructions (Addendum)
Ms. Peschke,  I do not know why you had a stroke.  I am glad that you are seeing a neurologist in a few weeks.  In the meantime, we can make some adjustments to your medicines.  I am going to start you on aspirin 81 mg daily.  You can buy this over-the-counter.  I am also going to start you on a blood pressure medicine called amlodipine-olmesartan (Azor is the brand name).  This is also just a once daily medicine.  We will need to monitor some labs when I see you back in 2 weeks to make sure that your kidneys are doing okay with the blood pressure medicine. We are also going to get a heart monitor which will be mailed to you.  We are also going to schedule you for an echocardiogram and ultrasounds of your carotid arteries in your neck, this was done at [facility].  Our staff will call you to schedule this.  It is important but not terribly urgent.  Thankfully it seems that you have escaped any major lasting damage from the stroke but the most important thing we can do is to prevent future strokes.  You are already well on your way to doing this by having quit smoking.. I will see you back in a few weeks.  We can consider an injection of your wrist at that time.  Eliezer Mccoy, MD

## 2022-06-14 NOTE — Progress Notes (Unsigned)
Enrolled for [employer] to mail a ZIO XT long term holter monitor to the patients address on file.   DOD to read. 
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
## 2022-06-15 ENCOUNTER — Telehealth: Payer: Self-pay

## 2022-06-15 DIAGNOSIS — I10 Essential (primary) hypertension: Secondary | ICD-10-CM

## 2022-06-15 DIAGNOSIS — Z8673 Personal history of transient ischemic attack (TIA), and cerebral infarction without residual deficits: Secondary | ICD-10-CM

## 2022-06-15 DIAGNOSIS — I639 Cerebral infarction, unspecified: Secondary | ICD-10-CM | POA: Insufficient documentation

## 2022-06-15 HISTORY — DX: Personal history of transient ischemic attack (TIA), and cerebral infarction without residual deficits: Z86.73

## 2022-06-15 LAB — BASIC METABOLIC PANEL
BUN/Creatinine Ratio: 21 (ref 12–28)
BUN: 15 mg/dL (ref 8–27)
CO2: 25 mmol/L (ref 20–29)
Calcium: 9.9 mg/dL (ref 8.7–10.3)
Chloride: 100 mmol/L (ref 96–106)
Creatinine, Ser: 0.71 mg/dL (ref 0.57–1.00)
Glucose: 102 mg/dL — ABNORMAL HIGH (ref 70–99)
Potassium: 4.5 mmol/L (ref 3.5–5.2)
Sodium: 140 mmol/L (ref 134–144)
eGFR: 95 mL/min/{1.73_m2} (ref >59)

## 2022-06-15 LAB — LIPID PANEL
Chol/HDL Ratio: 2.8 ratio (ref 0.0–4.4)
Cholesterol, Total: 153 mg/dL (ref 100–199)
HDL: 54 mg/dL (ref >39)
LDL Chol Calc (NIH): 65 mg/dL (ref 0–99)
Triglycerides: 212 mg/dL — ABNORMAL HIGH (ref 0–149)
VLDL Cholesterol Cal: 34 mg/dL (ref 5–40)

## 2022-06-15 LAB — HEMOGLOBIN A1C
Est. average glucose Bld gHb Est-mCnc: 123 mg/dL
Hgb A1c MFr Bld: 5.9 % — ABNORMAL HIGH (ref 4.8–5.6)

## 2022-06-15 LAB — TSH RFX ON ABNORMAL TO FREE T4: TSH: 1.89 u[IU]/mL (ref 0.450–4.500)

## 2022-06-15 MED ORDER — AMLODIPINE-OLMESARTAN 5-20 MG PO TABS: 1.0000 | ORAL_TABLET | Freq: Every day | ORAL | 0 refills | Status: DC
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
## 2022-06-15 NOTE — Assessment & Plan Note (Signed)
Elevated in office reading x 2 yesterday, reviewed recent other and office readings that have likewise been elevated.  Her home records suggest that she does have elevated blood pressures at home as well.  Given new subacute stroke, certainly would favor starting therapy today. -Baseline BMP today -Amlodipine-olmesartan 5-20 mg daily -Follow-up scheduled in 2 weeks, will repeat BMP at that time

## 2022-06-15 NOTE — Telephone Encounter (Signed)
Patient calls nurse line regarding amlodipine-olmesartan prescription. She reports that pharmacy never received prescription.   Called pharmacy. Verified that they never received prescription. Resent electronically.   Called patient and provided update.   Veronda Prude, RN

## 2022-06-20 DIAGNOSIS — I4891 Unspecified atrial fibrillation: Secondary | ICD-10-CM | POA: Diagnosis not present

## 2022-06-20 DIAGNOSIS — I639 Cerebral infarction, unspecified: Secondary | ICD-10-CM | POA: Diagnosis not present

## 2022-06-25 ENCOUNTER — Ambulatory Visit: Payer: BC Managed Care – PPO | Admitting: Hematology

## 2022-06-25 ENCOUNTER — Other Ambulatory Visit: Payer: BC Managed Care – PPO

## 2022-06-28 ENCOUNTER — Other Ambulatory Visit: Payer: Self-pay

## 2022-06-28 ENCOUNTER — Encounter: Payer: Self-pay | Admitting: Student

## 2022-06-28 ENCOUNTER — Ambulatory Visit: Payer: BC Managed Care – PPO | Admitting: Student

## 2022-06-28 VITALS — BP 121/72 | HR 67 | Ht 61.0 in | Wt 185.4 lb

## 2022-06-28 DIAGNOSIS — M654 Radial styloid tenosynovitis [de Quervain]: Secondary | ICD-10-CM | POA: Diagnosis not present

## 2022-06-28 DIAGNOSIS — I639 Cerebral infarction, unspecified: Secondary | ICD-10-CM | POA: Diagnosis not present

## 2022-06-28 DIAGNOSIS — I10 Essential (primary) hypertension: Secondary | ICD-10-CM | POA: Diagnosis not present

## 2022-06-28 MED ORDER — METHYLPREDNISOLONE ACETATE 40 MG/ML IJ SUSP
40.0000 mg | Freq: Once | INTRAMUSCULAR | Status: AC
  Administered 2022-06-28: 40 mg via INTRAMUSCULAR

## 2022-06-28 MED ORDER — AMLODIPINE-OLMESARTAN 5-20 MG PO TABS: 0.5000 | ORAL_TABLET | Freq: Every day | ORAL | 0 refills | Status: DC

## 2022-06-28 NOTE — Assessment & Plan Note (Signed)
Unfortunately, having some feelings of "weakness" which sound like lightheadedness on further questioning.  No frank orthostasis.  However, would favor reducing her dose. -Will cut her back from Azor 5-20 mg 1 tablet daily to 1/2 tablet daily.  She will monitor her BP at home. -BMP today as recently started ARB

## 2022-06-28 NOTE — Progress Notes (Signed)
Reviewed and agree with Dr Koval's plan.   
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
## 2022-06-28 NOTE — Assessment & Plan Note (Signed)
Ongoing workup.  Awaiting results of Zio patch.  Also awaiting echocardiogram and carotid Dopplers.  BP control as above.  Is on a statin.  Currently taking ASA.  Stroke thought to be subacute and several weeks ago, so did not pursue DAPT.  Seen by neurology who agree with her workup. -Follow-up on Zio patch, echo, carotid Dopplers -Repeat MRI in 3 months per neurology

## 2022-06-28 NOTE — Patient Instructions (Signed)
[name], Shirley Howard to see you. We will check your kidneys today since you're on the olmesartan. Let's cut back to 1/2 tablet daily to see if that helps with the weird feeling.   We injected your wrist today. If this does not help, let me know either with a phone call or mychart message and we can get you to a hand surgeon.   I'll be eager to see what your repeat MRI shows.   Eliezer Mccoy, MD

## 2022-06-29 LAB — BASIC METABOLIC PANEL
BUN/Creatinine Ratio: 22 (ref 12–28)
BUN: 17 mg/dL (ref 8–27)
CO2: 23 mmol/L (ref 20–29)
Calcium: 9.8 mg/dL (ref 8.7–10.3)
Chloride: 99 mmol/L (ref 96–106)
Creatinine, Ser: 0.79 mg/dL (ref 0.57–1.00)
Glucose: 89 mg/dL (ref 70–99)
Potassium: 4.5 mmol/L (ref 3.5–5.2)
Sodium: 139 mmol/L (ref 134–144)
eGFR: 84 mL/min/{1.73_m2} (ref >59)

## 2022-07-06 ENCOUNTER — Other Ambulatory Visit: Payer: Self-pay

## 2022-07-06 DIAGNOSIS — C50412 Malignant neoplasm of upper-outer quadrant of left female breast: Secondary | ICD-10-CM

## 2022-07-08 NOTE — Assessment & Plan Note (Signed)
[id number], stage IB, grade 3, ER positive, PR positive, HER-2 negative, (+) DCIS, RS 30 -She was diagnosed in 10/2014. She is s/p left breast lumpectomy with RS 30, adjuvant chemo TC and adjuvant radiation.  -She started antiestrogen therapy with Letrozole in 05/2015. Tolerating well with mild to moderate hot flash, mild arthralgia, no other significant side effects. -most recent mammogram 02/20/2022 was negative. -She is clinically doing well. Lab reviewed, her CBC and CMP are within normal limits. Her physical exam was unremarkable. There is no clinical concern for recurrence. -she has completed 7 years of letrozole in April 2024  -F/u in 1 year

## 2022-07-08 NOTE — Assessment & Plan Note (Signed)
-  01/2018 DEXA showed osteopenia with lowest t-score -1.1 at right hip, overall mild and stable. Repeat on 02/13/21 was stable. -She will continue calcium and vitamin D

## 2022-07-09 ENCOUNTER — Encounter: Payer: Self-pay | Admitting: Hematology

## 2022-07-09 ENCOUNTER — Inpatient Hospital Stay: Payer: BC Managed Care – PPO | Attending: Hematology

## 2022-07-09 ENCOUNTER — Inpatient Hospital Stay: Payer: BC Managed Care – PPO | Admitting: Hematology

## 2022-07-09 ENCOUNTER — Other Ambulatory Visit: Payer: Self-pay

## 2022-07-09 VITALS — BP 133/80 | HR 55 | Temp 98.1°F | Resp 18 | Ht 61.0 in | Wt 186.8 lb

## 2022-07-09 DIAGNOSIS — R232 Flushing: Secondary | ICD-10-CM | POA: Insufficient documentation

## 2022-07-09 DIAGNOSIS — C50412 Malignant neoplasm of upper-outer quadrant of left female breast: Secondary | ICD-10-CM | POA: Insufficient documentation

## 2022-07-09 DIAGNOSIS — Z780 Asymptomatic menopausal state: Secondary | ICD-10-CM

## 2022-07-09 DIAGNOSIS — M85851 Other specified disorders of bone density and structure, right thigh: Secondary | ICD-10-CM | POA: Diagnosis not present

## 2022-07-09 DIAGNOSIS — K219 Gastro-esophageal reflux disease without esophagitis: Secondary | ICD-10-CM | POA: Diagnosis not present

## 2022-07-09 DIAGNOSIS — M2550 Pain in unspecified joint: Secondary | ICD-10-CM | POA: Insufficient documentation

## 2022-07-09 DIAGNOSIS — Z79811 Long term (current) use of aromatase inhibitors: Secondary | ICD-10-CM | POA: Insufficient documentation

## 2022-07-09 DIAGNOSIS — Z87891 Personal history of nicotine dependence: Secondary | ICD-10-CM | POA: Insufficient documentation

## 2022-07-09 DIAGNOSIS — M8580 Other specified disorders of bone density and structure, unspecified site: Secondary | ICD-10-CM | POA: Diagnosis not present

## 2022-07-09 DIAGNOSIS — R030 Elevated blood-pressure reading, without diagnosis of hypertension: Secondary | ICD-10-CM | POA: Insufficient documentation

## 2022-07-09 DIAGNOSIS — Z170 Estrogen receptor positive status [ER+]: Secondary | ICD-10-CM | POA: Diagnosis not present

## 2022-07-09 DIAGNOSIS — E2839 Other primary ovarian failure: Secondary | ICD-10-CM

## 2022-07-09 DIAGNOSIS — Z923 Personal history of irradiation: Secondary | ICD-10-CM | POA: Insufficient documentation

## 2022-07-09 DIAGNOSIS — Z79899 Other long term (current) drug therapy: Secondary | ICD-10-CM | POA: Insufficient documentation

## 2022-07-09 DIAGNOSIS — Z9221 Personal history of antineoplastic chemotherapy: Secondary | ICD-10-CM | POA: Insufficient documentation

## 2022-07-09 DIAGNOSIS — Z7982 Long term (current) use of aspirin: Secondary | ICD-10-CM | POA: Diagnosis not present

## 2022-07-09 LAB — CBC WITH DIFFERENTIAL (CANCER CENTER ONLY)
Abs Immature Granulocytes: 0.02 10*3/uL (ref 0.00–0.07)
Basophils Absolute: 0 10*3/uL (ref 0.0–0.1)
Basophils Relative: 0 %
Eosinophils Absolute: 0.2 10*3/uL (ref 0.0–0.5)
Eosinophils Relative: 2 %
HCT: 38.5 % (ref 36.0–46.0)
Hemoglobin: 12.8 g/dL (ref 12.0–15.0)
Immature Granulocytes: 0 %
Lymphocytes Relative: 26 %
Lymphs Abs: 1.9 10*3/uL (ref 0.7–4.0)
MCH: 29.7 pg (ref 26.0–34.0)
MCHC: 33.2 g/dL (ref 30.0–36.0)
MCV: 89.3 fL (ref 80.0–100.0)
Monocytes Absolute: 0.5 10*3/uL (ref 0.1–1.0)
Monocytes Relative: 7 %
Neutro Abs: 4.8 10*3/uL (ref 1.7–7.7)
Neutrophils Relative %: 65 %
Platelet Count: 231 10*3/uL (ref 150–400)
RBC: 4.31 MIL/uL (ref 3.87–5.11)
RDW: 13.3 % (ref 11.5–15.5)
WBC Count: 7.4 10*3/uL (ref 4.0–10.5)
nRBC: 0 % (ref 0.0–0.2)

## 2022-07-09 LAB — CMP (CANCER CENTER ONLY)
ALT: 26 U/L (ref 0–44)
AST: 25 U/L (ref 15–41)
Albumin: 4.5 g/dL (ref 3.5–5.0)
Alkaline Phosphatase: 91 U/L (ref 38–126)
Anion gap: 6 (ref 5–15)
BUN: 19 mg/dL (ref 8–23)
CO2: 29 mmol/L (ref 22–32)
Calcium: 9.5 mg/dL (ref 8.9–10.3)
Chloride: 102 mmol/L (ref 98–111)
Creatinine: 0.77 mg/dL (ref 0.44–1.00)
GFR, Estimated: >60 mL/min (ref >60)
Glucose, Bld: 95 mg/dL (ref 70–99)
Potassium: 4.2 mmol/L (ref 3.5–5.1)
Sodium: 137 mmol/L (ref 135–145)
Total Bilirubin: 0.6 mg/dL (ref 0.3–1.2)
Total Protein: 7 g/dL (ref 6.5–8.1)
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
## 2022-07-12 DIAGNOSIS — I4891 Unspecified atrial fibrillation: Secondary | ICD-10-CM | POA: Diagnosis not present

## 2022-07-12 DIAGNOSIS — I639 Cerebral infarction, unspecified: Secondary | ICD-10-CM | POA: Diagnosis not present

## 2022-07-16 ENCOUNTER — Encounter: Payer: Self-pay | Admitting: Family Medicine

## 2022-07-16 DIAGNOSIS — M25531 Pain in right wrist: Secondary | ICD-10-CM

## 2022-07-16 DIAGNOSIS — M654 Radial styloid tenosynovitis [de Quervain]: Secondary | ICD-10-CM

## 2022-07-20 ENCOUNTER — Other Ambulatory Visit: Payer: Self-pay | Admitting: Student

## 2022-07-20 DIAGNOSIS — I10 Essential (primary) hypertension: Secondary | ICD-10-CM

## 2022-07-23 DIAGNOSIS — M654 Radial styloid tenosynovitis [de Quervain]: Secondary | ICD-10-CM | POA: Diagnosis not present

## 2022-07-27 ENCOUNTER — Telehealth: Payer: Self-pay

## 2022-07-27 DIAGNOSIS — I639 Cerebral infarction, unspecified: Secondary | ICD-10-CM

## 2022-07-27 NOTE — Telephone Encounter (Signed)
Patient calls nurse line in regards to her US Carotid.   She reports she received a letter stating this test is covered by her insurance, however must be done by July 10th.  I called Vascular to schedule, however the order is incorrect.   Please place new order for [id number].  Will call to schedule once changed.

## 2022-07-30 NOTE — Telephone Encounter (Signed)
Patient scheduled for 6/28 at 1pm.   Patient aware.

## 2022-08-01 ENCOUNTER — Ambulatory Visit (HOSPITAL_COMMUNITY)
Admission: RE | Admit: 2022-08-01 | Discharge: 2022-08-01 | Disposition: A | Discharge status: Home / Self Care | Payer: BC Managed Care – PPO | Source: Ambulatory Visit | Attending: Family Medicine | Admitting: Family Medicine

## 2022-08-01 ENCOUNTER — Other Ambulatory Visit: Payer: Self-pay | Admitting: Student

## 2022-08-01 DIAGNOSIS — Z87891 Personal history of nicotine dependence: Secondary | ICD-10-CM | POA: Insufficient documentation

## 2022-08-01 DIAGNOSIS — I10 Essential (primary) hypertension: Secondary | ICD-10-CM | POA: Diagnosis not present

## 2022-08-01 DIAGNOSIS — I639 Cerebral infarction, unspecified: Secondary | ICD-10-CM

## 2022-08-01 DIAGNOSIS — Z8673 Personal history of transient ischemic attack (TIA), and cerebral infarction without residual deficits: Secondary | ICD-10-CM | POA: Diagnosis not present

## 2022-08-01 DIAGNOSIS — I6389 Other cerebral infarction: Secondary | ICD-10-CM | POA: Diagnosis not present

## 2022-08-01 LAB — ECHOCARDIOGRAM COMPLETE
AV Mean grad: 3 mmHg
AV Peak grad: 6.2 mmHg
Ao pk vel: 1.24 m/s
Area-P 1/2: 4.49 cm2
S' Lateral: 2.9 cm

## 2022-08-03 ENCOUNTER — Ambulatory Visit (HOSPITAL_COMMUNITY)
Admission: RE | Admit: 2022-08-03 | Discharge: 2022-08-03 | Disposition: A | Discharge status: Home / Self Care | Payer: BC Managed Care – PPO | Source: Ambulatory Visit | Attending: Family Medicine | Admitting: Family Medicine

## 2022-08-03 DIAGNOSIS — I639 Cerebral infarction, unspecified: Secondary | ICD-10-CM | POA: Diagnosis not present

## 2022-08-03 NOTE — Progress Notes (Signed)
Bilateral carotid ultrasound study completed.   Please see CV Procedures for preliminary results.  Mialynn Shelvin, RVT  1:21 PM 08/03/22

## 2022-11-09 ENCOUNTER — Other Ambulatory Visit: Payer: Self-pay | Admitting: Student

## 2022-11-09 DIAGNOSIS — E782 Mixed hyperlipidemia: Secondary | ICD-10-CM

## 2023-01-05 DIAGNOSIS — H5213 Myopia, bilateral: Secondary | ICD-10-CM | POA: Diagnosis not present

## 2023-02-14 ENCOUNTER — Other Ambulatory Visit: Payer: Self-pay | Admitting: Family Medicine

## 2023-02-14 DIAGNOSIS — E782 Mixed hyperlipidemia: Secondary | ICD-10-CM

## 2023-03-04 ENCOUNTER — Ambulatory Visit
Admission: RE | Admit: 2023-03-04 | Discharge: 2023-03-04 | Disposition: A | Discharge status: Home / Self Care | Payer: Medicaid Other | Source: Ambulatory Visit | Attending: Hematology

## 2023-03-04 ENCOUNTER — Ambulatory Visit
Admission: RE | Admit: 2023-03-04 | Discharge: 2023-03-04 | Disposition: A | Discharge status: Home / Self Care | Payer: BC Managed Care – PPO | Source: Ambulatory Visit | Attending: Hematology

## 2023-03-04 DIAGNOSIS — Z170 Estrogen receptor positive status [ER+]: Secondary | ICD-10-CM

## 2023-03-04 DIAGNOSIS — E2839 Other primary ovarian failure: Secondary | ICD-10-CM

## 2023-03-19 ENCOUNTER — Encounter: Payer: Self-pay | Admitting: Nurse Practitioner

## 2023-08-01 ENCOUNTER — Ambulatory Visit: Admitting: Family Medicine

## 2023-08-01 ENCOUNTER — Encounter: Payer: Self-pay | Admitting: Family Medicine

## 2023-08-01 VITALS — BP 112/63 | HR 61 | Ht 61.0 in | Wt 186.4 lb

## 2023-08-01 DIAGNOSIS — Z79899 Other long term (current) drug therapy: Secondary | ICD-10-CM | POA: Diagnosis not present

## 2023-08-01 DIAGNOSIS — M25552 Pain in left hip: Secondary | ICD-10-CM

## 2023-08-01 DIAGNOSIS — Z87891 Personal history of nicotine dependence: Secondary | ICD-10-CM

## 2023-08-01 DIAGNOSIS — M533 Sacrococcygeal disorders, not elsewhere classified: Secondary | ICD-10-CM

## 2023-08-01 DIAGNOSIS — I10 Essential (primary) hypertension: Secondary | ICD-10-CM

## 2023-08-01 DIAGNOSIS — E66811 Obesity, class 1: Secondary | ICD-10-CM

## 2023-08-01 DIAGNOSIS — Z8669 Personal history of other diseases of the nervous system and sense organs: Secondary | ICD-10-CM | POA: Diagnosis not present

## 2023-08-01 DIAGNOSIS — E782 Mixed hyperlipidemia: Secondary | ICD-10-CM | POA: Diagnosis not present

## 2023-08-01 DIAGNOSIS — Z860100 Personal history of colon polyps, unspecified: Secondary | ICD-10-CM | POA: Diagnosis not present

## 2023-08-01 DIAGNOSIS — Z8673 Personal history of transient ischemic attack (TIA), and cerebral infarction without residual deficits: Secondary | ICD-10-CM

## 2023-08-01 DIAGNOSIS — F17210 Nicotine dependence, cigarettes, uncomplicated: Secondary | ICD-10-CM

## 2023-08-01 LAB — POCT GLYCOSYLATED HEMOGLOBIN (HGB A1C): Hemoglobin A1C: 5.6 % (ref 4.0–5.6)

## 2023-08-01 NOTE — Patient Instructions (Addendum)
 Your blood pressure is under good control.  Please read the information about hip bursitis and sacroiliac joint dysfunction.    Please return to have your Pap Smear.   We are checking your Cholesterol, kidneys, electrolytes and blood sugar.    A referral was placed to radiology for a Low Dose CAT scan of the lung to screen for lung cancer.       Dr Karmela Bram will let you know it they are abnormal.  If they are normal, he will send your a message through your MyChart    ITS TIME TO GET YOUR SHINGLES VACCINATION  Chickenpox and shingles are related because they are caused by the same virus (varicella-zoster virus). After a person recovers from chickenpox, the virus stays dormant (inactive) in the body. It can reactivate years later and cause shingles.  CDC recommends that adults 50 years and older get two doses of the shingles vaccine called Shingrix (recombinant zoster vaccine) to prevent shingles and the complications from the disease.  Shingrix vaccination provides strong protection against shingles. In adults 50 years Shingrix is more than 90% effective at preventing shingles.  Studies show that Shingrix is safe. The vaccine helps your body create a strong defense against shingles. As a result, you are likely to have temporary side effects from getting the shots. Some people feel they are having a mild flu without the head cold symptoms.  The side effects might affect your ability to do normal daily activities for 2 to 3 days.  Getting the shot when you have a couple days without commitments is a good idea, in case you do feel under the weather.  Medicare prescription drug plans (Part D) usually cover all available vaccines needed to prevent illness, like the shingles (Shingrix) shot. Contact your Medicare drug plan If you are uncertain if they will cover the shingles vaccination.   Your pharmacist can give you Shingrix as a shot in your upper arm. You will need two Shingrix vaccinations  to complete your inoculation against shingles.  The second injection is given at least 2 months after the first Shingrix vaccination was given.
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
## 2023-08-02 ENCOUNTER — Encounter: Payer: Self-pay | Admitting: Family Medicine

## 2023-08-02 DIAGNOSIS — M25552 Pain in left hip: Secondary | ICD-10-CM | POA: Insufficient documentation

## 2023-08-02 DIAGNOSIS — F17210 Nicotine dependence, cigarettes, uncomplicated: Secondary | ICD-10-CM | POA: Insufficient documentation

## 2023-08-02 DIAGNOSIS — Z8669 Personal history of other diseases of the nervous system and sense organs: Secondary | ICD-10-CM | POA: Insufficient documentation

## 2023-08-02 DIAGNOSIS — M533 Sacrococcygeal disorders, not elsewhere classified: Secondary | ICD-10-CM | POA: Insufficient documentation

## 2023-08-02 DIAGNOSIS — Z87891 Personal history of nicotine dependence: Secondary | ICD-10-CM | POA: Insufficient documentation

## 2023-08-02 LAB — BASIC METABOLIC PANEL WITH GFR
BUN/Creatinine Ratio: 30 — ABNORMAL HIGH (ref 12–28)
BUN: 22 mg/dL (ref 8–27)
CO2: 24 mmol/L (ref 20–29)
Calcium: 9.6 mg/dL (ref 8.7–10.3)
Chloride: 101 mmol/L (ref 96–106)
Creatinine, Ser: 0.74 mg/dL (ref 0.57–1.00)
Glucose: 83 mg/dL (ref 70–99)
Potassium: 5.1 mmol/L (ref 3.5–5.2)
Sodium: 140 mmol/L (ref 134–144)
eGFR: 90 mL/min/{1.73_m2} (ref >59)

## 2023-08-02 LAB — LDL CHOLESTEROL, DIRECT: LDL Direct: 83 mg/dL (ref 0–99)

## 2023-08-02 NOTE — Assessment & Plan Note (Addendum)
 New complaint nset: couple months ago Location: low back on both sides Quality: acute sharp pain Function: interfering with sleep Pattern: intermittent Non-progressive Radiation:none Relief: ibuprofen Precipitant: sitting.  No pain with standing or particular movements.   Trauma (Acute or Chronic): no Prior Diagnostic Testing or Treatments: no prior imaging LS spine Relevant PMH/PSH: no prior complaint of back pain   Exam TTP over left greater trochanter  A/ Working diagnosis of Greater trochanteric Pain Syndrome P/ Discussed options of Physical Therapy, steroid injection, exercise/prnNSAIDs Shirley Howard is starting water aerobics which will be a very good form of exercise for this condition.  Patient ed about troch bursists given. Shirley Howard to let us  know if pain worsens, or fails to resolve.

## 2023-08-02 NOTE — Assessment & Plan Note (Signed)
 Patient started smoking age 63, a pack per day, quit 2014 at age 65, 65 pack-years.  Low dose Screening Lung CT ordered.  Plan annual screening until 2029
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
## 2023-08-02 NOTE — Assessment & Plan Note (Signed)
 Onset: couple months ago Location: low back on both sides Quality: aching with intermittent acute sharp pain Function: interfering with sleep Pattern: intermittent Non-progressive Radiation: left posterior thigh Relief: ibuprofen Precipitant: sitting.  No pain with standing or particular movements.  No stiffness, no restriction of motion No saddle dysesthesias, no leg weakness nor numbness  Trauma (Acute or Chronic): no Prior Diagnostic Testing or Treatments: no prior imaging LS spine Relevant PMH/PSH: no prior complaint of back pain   Back Exam: Inspection: Motion: SLR seated: negative                       XSLR seated: negative                         Palpable tenderness: bilateral TTP medial to bilateral posterior superior iliac spines  Sensory change: none Reflex change:1+ patella bilat., 0 bilateral ankles  Strength at foot Plantar-flexion:  5/ 5    Dorsi-flexion: 5 / 5     Leg strength Quad: 5 / 5   Hamstring:  5/ 5    Non-atalgic gait Able to ascend and descent exam table without assistance.   A/ Working explanation is SI joint dysfunction.  Other possibilities include lumbar OA or lumbar spinal.  No sxs/signs concerning for HNP with radiculopathy P/  After discussion of Physical Therapy option, Shirley Howard elected to use prn OTC NSAID therapy Patient ed given about SI joint dysfunction If worsens or fails to resolve, Shirley Howard will let us  know.

## 2023-08-02 NOTE — Assessment & Plan Note (Addendum)
 Last surveillance colonoscopy by Dr Aneita 2022 with one small, sessile hyperplastic polyp. Dr Aneita instructed patient to have a repeat surveillance colonosocpy in 5 years, though HM is set for 10 years.  Discussed with Mrs Luebbe the prudence of pursuing  colonoscopy in 2027.

## 2023-08-02 NOTE — Assessment & Plan Note (Signed)
 Established problem Current treatment atorvastatin  (LIPITOR) 40 MG tablet 40 mg, Daily  LDL pending

## 2023-08-02 NOTE — Assessment & Plan Note (Signed)
 Established problem Well Controlled. Patient is at goal of < 130/80. No signs of complications, medication side effects, or red flags. Continue  amLODipine -olmesartan  (AZOR ) 5-20 MG tablet 1 tablet, Daily

## 2023-08-07 ENCOUNTER — Ambulatory Visit: Payer: Self-pay | Admitting: Family Medicine

## 2023-08-12 ENCOUNTER — Ambulatory Visit
Admission: RE | Admit: 2023-08-12 | Discharge: 2023-08-12 | Disposition: A | Discharge status: Home / Self Care | Source: Ambulatory Visit | Attending: Family Medicine | Admitting: Family Medicine

## 2023-08-12 DIAGNOSIS — Z87891 Personal history of nicotine dependence: Secondary | ICD-10-CM | POA: Diagnosis not present

## 2023-08-12 DIAGNOSIS — F17210 Nicotine dependence, cigarettes, uncomplicated: Secondary | ICD-10-CM

## 2023-08-12 DIAGNOSIS — Z122 Encounter for screening for malignant neoplasm of respiratory organs: Secondary | ICD-10-CM | POA: Diagnosis not present

## 2023-10-08 ENCOUNTER — Other Ambulatory Visit: Payer: Self-pay | Admitting: Family Medicine

## 2023-10-08 DIAGNOSIS — I10 Essential (primary) hypertension: Secondary | ICD-10-CM

## 2023-10-31 ENCOUNTER — Encounter: Payer: Self-pay | Admitting: Family Medicine

## 2023-10-31 ENCOUNTER — Ambulatory Visit (INDEPENDENT_AMBULATORY_CARE_PROVIDER_SITE_OTHER): Admitting: Family Medicine

## 2023-10-31 ENCOUNTER — Other Ambulatory Visit (HOSPITAL_COMMUNITY)
Admission: RE | Admit: 2023-10-31 | Discharge: 2023-10-31 | Disposition: A | Discharge status: Home / Self Care | Source: Ambulatory Visit | Attending: Family Medicine | Admitting: Family Medicine

## 2023-10-31 VITALS — BP 112/83 | HR 55 | Ht 61.0 in | Wt 187.2 lb

## 2023-10-31 DIAGNOSIS — Z01419 Encounter for gynecological examination (general) (routine) without abnormal findings: Secondary | ICD-10-CM | POA: Diagnosis present

## 2023-10-31 DIAGNOSIS — Z23 Encounter for immunization: Secondary | ICD-10-CM | POA: Diagnosis not present

## 2023-10-31 DIAGNOSIS — Z8673 Personal history of transient ischemic attack (TIA), and cerebral infarction without residual deficits: Secondary | ICD-10-CM

## 2023-10-31 DIAGNOSIS — Z1151 Encounter for screening for human papillomavirus (HPV): Secondary | ICD-10-CM | POA: Insufficient documentation

## 2023-10-31 DIAGNOSIS — R87610 Atypical squamous cells of undetermined significance on cytologic smear of cervix (ASC-US): Secondary | ICD-10-CM | POA: Diagnosis not present

## 2023-10-31 NOTE — Progress Notes (Unsigned)
 Shirley Howard is {Pc accompanied by:5710} Sources of clinical information for visit is/are {Information source:60032}. Nursing assessment for this office visit was reviewed with the patient for accuracy and revision.   Previous Report(s) Reviewed: {Outside review:15817}     10/31/2023   10:43 AM  Depression screen PHQ 2/9  Decreased Interest 0  Down, Depressed, Hopeless 0  PHQ - 2 Score 0  Altered sleeping 2  Tired, decreased energy 1  Change in appetite 1  Feeling bad or failure about yourself  0  Trouble concentrating 0  Moving slowly or fidgety/restless 0  Suicidal thoughts 0  PHQ-9 Score 4  Difficult doing work/chores Not difficult at all   [employer] Office Visit from 10/31/2023 in [facility] - A Dept Of [place]. [facility] Office Visit from 08/01/2023 in [facility] - A Dept Of [facility]. [facility] Office Visit from 06/28/2022 in [facility] - A Dept Of [place] [facility]. [facility]  Thoughts that you would be better off dead, or of hurting yourself in some way Not at all Not at all Not at all  PHQ-9 Total Score 4 0 3       10/31/2023   10:43 AM 06/28/2022    9:27 AM 06/02/2015    4:46 PM 05/03/2015   11:40 AM 03/29/2015   11:49 AM  Fall Risk   Falls in the past year? 0 0 No  No  No   Number falls in past yr: 0 0     Injury with Fall? 0 0        Data saved with a previous flowsheet row definition       10/31/2023   10:43 AM 08/01/2023    9:55 AM 06/28/2022    9:27 AM  PHQ9 SCORE ONLY  PHQ-9 Total Score 4 0  3      Data saved with a previous flowsheet row definition    There are no preventive care reminders to display for this patient.  Health Maintenance Due  Topic Date Due   Medicare Annual Wellness (AWV)  Never done   Zoster Vaccines- Shingrix (1 of 2) 10/12/1977   Pneumococcal Vaccine: 50+ Years (2 of 2 - PPSV23, PCV20, or PCV21) 05/03/2014   Influenza Vaccine  09/06/2023   COVID-19  Vaccine (7 - Pfizer risk 2024-25 season) 10/07/2023      History/P.E. limitations: {exam; limitations ed:60112}  There are no preventive care reminders to display for this patient. There are no preventive care reminders to display for this patient.  Health Maintenance Due  Topic Date Due   Medicare Annual Wellness (AWV)  Never done   Zoster Vaccines- Shingrix (1 of 2) 10/12/1977   Pneumococcal Vaccine: 50+ Years (2 of 2 - PPSV23, PCV20, or PCV21) 05/03/2014   Influenza Vaccine  09/06/2023   COVID-19 Vaccine (7 - Pfizer risk 2024-25 season) 10/07/2023     Chief Complaint  Patient presents with   Gynecologic Exam     Discussed the use of AI scribe software for clinical note transcription with the patient, who gave verbal consent to proceed.  History of Present Illness      SDOH Screenings   Food Insecurity: No Food Insecurity (05/03/2022)  Housing: Low Risk  (05/03/2022)  Transportation Needs: No Transportation Needs (05/03/2022)  Alcohol Screen: Low Risk  (05/03/2022)  Depression (PHQ2-9): Low Risk  (10/31/2023)  Financial Resource Strain: Low Risk  (05/03/2022)  Physical Activity:  Unknown (05/03/2022)  Social Connections: Unknown (05/16/2022)   Received from [facility]  Stress: Stress Concern Present (05/03/2022)  Tobacco Use: Medium Risk (10/31/2023)   --------------------------------------------------------------------------------------------------------------------------------------------- Visit Problem List with Assessment and Plan   Assessment and Plan Assessment & Plan      No problem-specific Assessment & Plan notes found for this encounter.

## 2023-10-31 NOTE — Patient Instructions (Addendum)
 Shirley Howard -  Your 2023 Pap smear showed a condition called AS-CUS (Atypical Squamous Cells of Undetermined Signficance), but your test for the bad HPV viruses were negative.   If you have two HPV virus test in a row, three years apart, then the guidelines say it is safe for you to stop having screening for cervical cancer.  You next HPV test would be in 2028.   You had Prevnar-20 pneumococcal vaccination today to prevent pneumococcal pneumonia, the most common type of pneumonia.  You will not need another pneumococcal vaccination in the future.

## 2023-11-01 DIAGNOSIS — R87610 Atypical squamous cells of undetermined significance on cytologic smear of cervix (ASC-US): Secondary | ICD-10-CM | POA: Insufficient documentation

## 2023-11-01 NOTE — Assessment & Plan Note (Signed)
 Shirley Howard presents for follow up of 2023 Pap with ASCUS & HR HPV negative Prior Pap/HPV testing 2019 Normal Pap/HR HPV negative 2016 ASCUS/ HR HPV negative 2013 Normal Pap 2010 Normal Pap   ASCCP guideline recommends if two HR HPV tests negative three years apart, then may stop screening Pap/HR- HPV testing.   Exam Genitalia:  Normal introitus for age, no external lesions, no vaginal discharge, pale vaginal mucosa, no vaginal or cervical lesions, no friaility or hemorrhage, normal uterus size and position, no adnexal masses or tenderness HR-HPV test sample from Cx Os obtained and sent for testing  Assessment and Plan Await test results

## 2023-11-05 LAB — CYTOLOGY - PAP
Comment: NEGATIVE
Diagnosis: NEGATIVE
High risk HPV: NEGATIVE

## 2023-11-06 ENCOUNTER — Ambulatory Visit: Payer: Self-pay | Admitting: Family Medicine

## 2024-01-21 ENCOUNTER — Other Ambulatory Visit: Payer: Self-pay | Admitting: Family Medicine

## 2024-01-21 DIAGNOSIS — Z1231 Encounter for screening mammogram for malignant neoplasm of breast: Secondary | ICD-10-CM

## 2024-02-01 ENCOUNTER — Other Ambulatory Visit: Payer: Self-pay | Admitting: Family Medicine

## 2024-02-01 DIAGNOSIS — E782 Mixed hyperlipidemia: Secondary | ICD-10-CM

## 2024-03-30 ENCOUNTER — Ambulatory Visit
